# Patient Record
Sex: Female | Born: 1945 | Race: Black or African American | Hispanic: No | Smoking: Former smoker
Health system: Southern US, Community
[De-identification: ages and names within clinical notes are randomized; demographics above are authoritative.]

## PROBLEM LIST (undated history)

## (undated) DIAGNOSIS — H269 Unspecified cataract: Secondary | ICD-10-CM

## (undated) DIAGNOSIS — E785 Hyperlipidemia, unspecified: Secondary | ICD-10-CM

## (undated) DIAGNOSIS — K219 Gastro-esophageal reflux disease without esophagitis: Secondary | ICD-10-CM

## (undated) DIAGNOSIS — I2510 Atherosclerotic heart disease of native coronary artery without angina pectoris: Secondary | ICD-10-CM

## (undated) DIAGNOSIS — F32A Depression, unspecified: Secondary | ICD-10-CM

## (undated) DIAGNOSIS — M1990 Unspecified osteoarthritis, unspecified site: Secondary | ICD-10-CM

## (undated) DIAGNOSIS — E039 Hypothyroidism, unspecified: Secondary | ICD-10-CM

## (undated) DIAGNOSIS — I10 Essential (primary) hypertension: Secondary | ICD-10-CM

## (undated) DIAGNOSIS — T7840XA Allergy, unspecified, initial encounter: Secondary | ICD-10-CM

## (undated) DIAGNOSIS — I639 Cerebral infarction, unspecified: Secondary | ICD-10-CM

## (undated) HISTORY — DX: Gastro-esophageal reflux disease without esophagitis: K21.9

## (undated) HISTORY — DX: Unspecified osteoarthritis, unspecified site: M19.90

## (undated) HISTORY — DX: Unspecified cataract: H26.9

## (undated) HISTORY — DX: Essential (primary) hypertension: I10

## (undated) HISTORY — DX: Allergy, unspecified, initial encounter: T78.40XA

## (undated) HISTORY — PX: CHOLECYSTECTOMY: SHX55

## (undated) HISTORY — DX: Hyperlipidemia, unspecified: E78.5

## (undated) HISTORY — PX: CATARACT EXTRACTION: SUR2

## (undated) HISTORY — DX: Atherosclerotic heart disease of native coronary artery without angina pectoris: I25.10

## (undated) HISTORY — PX: ABDOMINAL HYSTERECTOMY: SHX81

## (undated) HISTORY — DX: Cerebral infarction, unspecified: I63.9

## (undated) HISTORY — PX: THYROIDECTOMY: SHX17

## (undated) HISTORY — PX: EYE SURGERY: SHX253

## (undated) HISTORY — DX: Depression, unspecified: F32.A

## (undated) HISTORY — DX: Hypothyroidism, unspecified: E03.9

---

## 1997-07-05 ENCOUNTER — Emergency Department (HOSPITAL_COMMUNITY): Admission: EM | Admit: 1997-07-05 | Discharge: 1997-07-05 | Payer: Self-pay | Admitting: Emergency Medicine

## 1999-10-10 ENCOUNTER — Encounter: Payer: Self-pay | Admitting: Emergency Medicine

## 1999-10-10 ENCOUNTER — Encounter: Admission: RE | Admit: 1999-10-10 | Discharge: 1999-10-10 | Payer: Self-pay | Admitting: Emergency Medicine

## 1999-10-10 ENCOUNTER — Encounter (INDEPENDENT_AMBULATORY_CARE_PROVIDER_SITE_OTHER): Payer: Self-pay | Admitting: *Deleted

## 1999-10-23 ENCOUNTER — Encounter (HOSPITAL_BASED_OUTPATIENT_CLINIC_OR_DEPARTMENT_OTHER): Payer: Self-pay | Admitting: General Surgery

## 1999-10-24 ENCOUNTER — Encounter (INDEPENDENT_AMBULATORY_CARE_PROVIDER_SITE_OTHER): Payer: Self-pay | Admitting: Specialist

## 1999-10-24 ENCOUNTER — Ambulatory Visit (HOSPITAL_COMMUNITY): Admission: RE | Admit: 1999-10-24 | Discharge: 1999-10-25 | Payer: Self-pay | Admitting: General Surgery

## 1999-10-24 ENCOUNTER — Encounter (INDEPENDENT_AMBULATORY_CARE_PROVIDER_SITE_OTHER): Payer: Self-pay | Admitting: *Deleted

## 2000-08-12 ENCOUNTER — Encounter: Admission: RE | Admit: 2000-08-12 | Discharge: 2000-08-12 | Payer: Self-pay | Admitting: Gynecology

## 2000-08-12 ENCOUNTER — Encounter: Payer: Self-pay | Admitting: Gynecology

## 2000-09-22 ENCOUNTER — Other Ambulatory Visit: Admission: RE | Admit: 2000-09-22 | Discharge: 2000-09-22 | Payer: Self-pay | Admitting: Gynecology

## 2001-02-08 ENCOUNTER — Emergency Department (HOSPITAL_COMMUNITY): Admission: EM | Admit: 2001-02-08 | Discharge: 2001-02-08 | Payer: Self-pay | Admitting: Emergency Medicine

## 2001-02-09 ENCOUNTER — Encounter: Payer: Self-pay | Admitting: Emergency Medicine

## 2001-08-22 ENCOUNTER — Encounter: Admission: RE | Admit: 2001-08-22 | Discharge: 2001-08-22 | Payer: Self-pay | Admitting: Gynecology

## 2001-08-22 ENCOUNTER — Encounter: Payer: Self-pay | Admitting: Gynecology

## 2001-10-04 ENCOUNTER — Other Ambulatory Visit: Admission: RE | Admit: 2001-10-04 | Discharge: 2001-10-04 | Payer: Self-pay | Admitting: Gynecology

## 2002-08-24 ENCOUNTER — Encounter: Payer: Self-pay | Admitting: Gynecology

## 2002-08-24 ENCOUNTER — Encounter: Admission: RE | Admit: 2002-08-24 | Discharge: 2002-08-24 | Payer: Self-pay | Admitting: Gynecology

## 2002-10-16 ENCOUNTER — Other Ambulatory Visit: Admission: RE | Admit: 2002-10-16 | Discharge: 2002-10-16 | Payer: Self-pay

## 2003-08-28 ENCOUNTER — Encounter: Admission: RE | Admit: 2003-08-28 | Discharge: 2003-08-28 | Payer: Self-pay | Admitting: Gynecology

## 2003-09-05 ENCOUNTER — Other Ambulatory Visit: Admission: RE | Admit: 2003-09-05 | Discharge: 2003-09-05 | Payer: Self-pay | Admitting: Gynecology

## 2003-12-09 ENCOUNTER — Emergency Department (HOSPITAL_COMMUNITY): Admission: EM | Admit: 2003-12-09 | Discharge: 2003-12-09 | Payer: Self-pay | Admitting: Emergency Medicine

## 2004-09-09 ENCOUNTER — Other Ambulatory Visit: Admission: RE | Admit: 2004-09-09 | Discharge: 2004-09-09 | Payer: Self-pay | Admitting: Gynecology

## 2004-09-16 ENCOUNTER — Encounter: Admission: RE | Admit: 2004-09-16 | Discharge: 2004-09-16 | Payer: Self-pay | Admitting: Gynecology

## 2005-09-17 ENCOUNTER — Encounter: Admission: RE | Admit: 2005-09-17 | Discharge: 2005-09-17 | Payer: Self-pay | Admitting: Gynecology

## 2005-09-25 ENCOUNTER — Other Ambulatory Visit: Admission: RE | Admit: 2005-09-25 | Discharge: 2005-09-25 | Payer: Self-pay | Admitting: Gynecology

## 2006-03-30 HISTORY — PX: TRANSTHORACIC ECHOCARDIOGRAM: SHX275

## 2006-06-17 ENCOUNTER — Emergency Department (HOSPITAL_COMMUNITY): Admission: EM | Admit: 2006-06-17 | Discharge: 2006-06-17 | Payer: Self-pay | Admitting: Emergency Medicine

## 2006-10-18 ENCOUNTER — Other Ambulatory Visit: Admission: RE | Admit: 2006-10-18 | Discharge: 2006-10-18 | Payer: Self-pay | Admitting: Gynecology

## 2006-10-20 ENCOUNTER — Encounter: Admission: RE | Admit: 2006-10-20 | Discharge: 2006-10-20 | Payer: Self-pay | Admitting: Gynecology

## 2007-02-17 HISTORY — PX: CARDIAC CATHETERIZATION: SHX172

## 2007-03-31 HISTORY — PX: NM MYOCAR PERF WALL MOTION: HXRAD629

## 2007-10-21 ENCOUNTER — Encounter: Admission: RE | Admit: 2007-10-21 | Discharge: 2007-10-21 | Payer: Self-pay | Admitting: Gynecology

## 2008-10-23 ENCOUNTER — Encounter: Admission: RE | Admit: 2008-10-23 | Discharge: 2008-10-23 | Payer: Self-pay | Admitting: Gynecology

## 2008-10-29 ENCOUNTER — Ambulatory Visit: Payer: Self-pay | Admitting: Internal Medicine

## 2008-10-29 DIAGNOSIS — K5900 Constipation, unspecified: Secondary | ICD-10-CM | POA: Insufficient documentation

## 2008-10-29 DIAGNOSIS — E119 Type 2 diabetes mellitus without complications: Secondary | ICD-10-CM

## 2008-10-29 DIAGNOSIS — IMO0001 Reserved for inherently not codable concepts without codable children: Secondary | ICD-10-CM | POA: Insufficient documentation

## 2008-10-29 DIAGNOSIS — Z794 Long term (current) use of insulin: Secondary | ICD-10-CM

## 2008-10-29 DIAGNOSIS — K219 Gastro-esophageal reflux disease without esophagitis: Secondary | ICD-10-CM

## 2008-12-04 ENCOUNTER — Ambulatory Visit: Payer: Self-pay | Admitting: Internal Medicine

## 2009-10-25 ENCOUNTER — Encounter: Admission: RE | Admit: 2009-10-25 | Discharge: 2009-10-25 | Payer: Self-pay | Admitting: Gynecology

## 2010-08-15 NOTE — Op Note (Signed)
[place]. [facility]  Patient:    Shelly Blevins, Shelly Blevins                        MRN: [id number] Proc. Date: 10/24/99 Adm. Date:  [id number] Disc. Date: [id number] Attending:  Sonda Primes CC:         Mardene Celeste. Lurene Shadow, M.D. (2 copies)                           Operative Report  PREOPERATIVE DIAGNOSIS:  Chronic calculus cholecystitis.  POSTOPERATIVE DIAGNOSIS:  Acute and subacute cholecystitis.  OPERATION:  Laparoscopic cholecystectomy.  SURGEON:  Mardene Celeste. Lurene Shadow, M.D.  ASSISTANT:  Marnee Spring. Wiliam Ke, M.D.  ANESTHESIA:  General.  INDICATIONS:  The patient is a 65 year old woman, presenting primary through the [facility] complaining of severe upper abdominal pain, nausea and vomiting.  She was thoroughly worked up with gallbladder ultrasound which showed multiple gallstones and thick-walled gallbladder.  Liver function studies have been normal.  Amylase and lipase levels have been within normal limits.  She was prepared and brought to the operating room for laparoscopic cholecystectomy.  DESCRIPTION OF PROCEDURE:  Following the induction of satisfactory anesthesia, the patient was positioned supinely, and the abdomen is prepped and draped to be included in a sterile operative field.  Open laparoscopy is created at the umbilicus with introduction of a Hasson cannula and insufflation of the peritoneal cavity to 14 mmHg pressure using carbon dioxide.  Following insufflation the camera is inserted, and visual exploration of the abdomen ensued.  The gallbladder was noted to be thick-walled and acutely inflamed, inflamed.  The liver edges were sharp, the liver surface is smooth.  The duodenum is closely adhered onto the gallbladder wall.  There were multiple adhesions, filmy adhesions of the pelvis.  The pelvic organs were not well-visualized.  The anterior gastric wall appeared to be normal.   Under direct vision, epigastric and lateral ports  were placed.  The gallbladder was grasped and retracted cephalad, and dissection of an inflammatory peel of omentum off the gallbladder wall was carried out carrying dissection down towards the ampulla of the gallbladder.  The cystic artery and the cystic duct were then carefully dissected free, the cystic artery being traced up to the gallbladder wall and the cystic duct traced to the gallbladder cystic duct junction and down to the gallbladder common duct junction.  The cystic artery was doubly clipped and transected.  The cystic duct was clipped proximally.  I opened the cystic duct and milked out multiple pieces of stone and sludge with the cystic duct.  The cystic duct, however, was quite small and it was not possible for me to get a catheter into the cystic duct to carry out a cholangiogram.  It was to be noted that the patients total bilirubin was normal as well as her alkaline phosphatase, SGOT and SGPT.  I decided then to abandon attempt at the cholangiogram, and the cystic duct was doubly clipped and transected.  The gallbladder was dissected free from the liver bed maintaining hemostasis along the entire course of the dissection using electrocautery.  At the end of dissection, the liver bed was again inspected. Additional areas of bleeding were treated with electrocautery.  The right upper quadrant was then thoroughly irrigated with normal saline.  The camera was then moved to the epigastric port, and the gallbladder retrieved with an EndoCatch pouch and removed from  the abdominal cavity through the umbilical port.  The right upper quadrant was then irrigated thoroughly.  Sponge, instrument and sharp counts were then verified and the wounds closed in layers as follows after the pneumoperitoneum was deflated:  The umbilical wound closed in two layers with 0 Dexon and 4-0 Dexon, epigastric and lateral ports closed with 4-0 Dexon.  All wounds were reinforced with Steri-Strips  and sterile dressings applied.  Anesthetic was reversed and the patient was removed from the operating room to the recovery room in stable condition.  She tolerated the procedure well. DD:  10/24/99 TD:  10/26/99 Job: [id number]

## 2010-10-09 ENCOUNTER — Other Ambulatory Visit: Payer: Self-pay | Admitting: Gynecology

## 2010-10-09 DIAGNOSIS — Z1231 Encounter for screening mammogram for malignant neoplasm of breast: Secondary | ICD-10-CM

## 2010-10-31 ENCOUNTER — Ambulatory Visit
Admission: RE | Admit: 2010-10-31 | Discharge: 2010-10-31 | Disposition: A | Discharge status: Home / Self Care | Payer: PRIVATE HEALTH INSURANCE | Source: Ambulatory Visit | Attending: Gynecology | Admitting: Gynecology

## 2010-10-31 DIAGNOSIS — Z1231 Encounter for screening mammogram for malignant neoplasm of breast: Secondary | ICD-10-CM

## 2010-12-15 ENCOUNTER — Other Ambulatory Visit: Payer: Self-pay | Admitting: Gynecology

## 2011-04-03 ENCOUNTER — Encounter: Payer: Self-pay | Admitting: *Deleted

## 2011-04-03 ENCOUNTER — Encounter: Payer: PRIVATE HEALTH INSURANCE | Attending: Endocrinology | Admitting: *Deleted

## 2011-04-03 DIAGNOSIS — E119 Type 2 diabetes mellitus without complications: Secondary | ICD-10-CM

## 2011-04-03 DIAGNOSIS — Z713 Dietary counseling and surveillance: Secondary | ICD-10-CM | POA: Insufficient documentation

## 2011-04-03 NOTE — Progress Notes (Signed)
  Medical Nutrition Therapy:  Appt start time: 0800 end time:  0900.   Assessment:  Primary concerns today: Patient here for update on diabetes education and carb counting . She has history of Type 2 Diabetes for about 10 years. 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 She also walks as able to increase her activity outside of work. She test her BG daily in the AM and is aware that her A1c is 8%.   MEDICATIONS: see list. Diabetes meds are Metformin and Novolog 70/30 @ 30 units before breakfast and supper   DIETARY INTAKE:  Usual eating pattern includes 3 meals and 1-3 snacks per day.  Everyday foods include good variety of food groups.  Avoided foods include rice, potatoes, pasta.    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   Nutritional Diagnosis:  NI-1.5 Excessive energy intake As related to obesity.  As evidenced by BMI of 32.8%.    Intervention:  Nutrition counseling and diabetes education provided. Reviewed basic physiology of diabetes, symptoms of high and low BG & treatment options, carb counting and value of a consistent carb intake for BG management. 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 Plan to review carb counting abilities, BG Log Book and insulin action.

## 2011-04-07 NOTE — Patient Instructions (Signed)
Goals:  Follow Diabetes Meal Plan as instructed  Eat 3 meals and snacks if hungry  Limit carbohydrate intake to 45 grams carbohydrate/meal  Limit carbohydrate intake to 15 grams carbohydrate/snack  Add lean protein foods to meals/snacks  Monitor glucose levels as instructed by your doctor  Aim for 15-30 mins of physical activity daily  Bring food record and glucose log to your next nutrition visit

## 2011-05-01 ENCOUNTER — Ambulatory Visit: Payer: PRIVATE HEALTH INSURANCE | Admitting: *Deleted

## 2011-05-13 ENCOUNTER — Ambulatory Visit: Payer: PRIVATE HEALTH INSURANCE | Admitting: *Deleted

## 2011-06-17 ENCOUNTER — Encounter: Payer: Self-pay | Admitting: *Deleted

## 2011-06-17 ENCOUNTER — Encounter: Payer: PRIVATE HEALTH INSURANCE | Attending: Endocrinology | Admitting: *Deleted

## 2011-06-17 VITALS — Ht 63.0 in | Wt 187.0 lb

## 2011-06-17 DIAGNOSIS — E119 Type 2 diabetes mellitus without complications: Secondary | ICD-10-CM

## 2011-06-17 DIAGNOSIS — Z713 Dietary counseling and surveillance: Secondary | ICD-10-CM | POA: Insufficient documentation

## 2011-06-17 NOTE — Progress Notes (Signed)
  Medical Nutrition Therapy:  Appt start time: 1730 end time:  1800.  Assessment:  Primary concerns today: patient here for follow up visit for diabetes education. She states her BGs have dropped from 170-200 range to 100-130 range both before breakfast and supper meals. She continues to take 70/30 @ 40 units before breakfast and 45 units before supper. She has started walking with her grand daughter every day for about 3 minutes which she enjoys and helps her sleep better at night.  MEDICATIONS: see list. Diabetes meds are Metformin and Novolog 70/30 before breakfast and supper   DIETARY INTAKE:  Usual eating pattern includes 3 meals and 1-3 snacks per day.  Everyday foods include good variety of food groups.  Avoided foods include rice, potatoes, pasta.    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   Nutritional Diagnosis:  NI-5.8.4 Inconsistent carbohydrate intake As related to diabetes.  As evidenced by A1c of 10% before education..    Intervention:   Commended patient on her successful changes in eating habits and her implementation of walking on a daily basis.  Goals: Follow Diabetes Meal Plan as instructed Eat 3 meals and snacks if hungry Limit carbohydrate intake to 30-45 grams carbohydrate/meal Limit carbohydrate intake to 15 grams  carbohydrate/snack Consider checking BG at alternate times during the day, including some post meal times Monitor glucose levels as instructed by your doctor Continue with 30 mins of walking every day   Handouts given during visit include:  Living Well with Diabetes  Carb Counting and Food Label   Monitoring/Evaluation:  Dietary intake, exercise, reading food labels, and body weight in 4 weeks. Plan to review carb counting abilities, BG Log Book and insulin action.

## 2011-11-10 ENCOUNTER — Other Ambulatory Visit: Payer: Self-pay | Admitting: Gynecology

## 2011-11-10 DIAGNOSIS — Z1231 Encounter for screening mammogram for malignant neoplasm of breast: Secondary | ICD-10-CM

## 2011-11-26 ENCOUNTER — Ambulatory Visit
Admission: RE | Admit: 2011-11-26 | Discharge: 2011-11-26 | Disposition: A | Discharge status: Home / Self Care | Payer: PRIVATE HEALTH INSURANCE | Source: Ambulatory Visit | Attending: Gynecology | Admitting: Gynecology

## 2011-11-26 DIAGNOSIS — Z1231 Encounter for screening mammogram for malignant neoplasm of breast: Secondary | ICD-10-CM

## 2011-12-28 ENCOUNTER — Other Ambulatory Visit: Payer: Self-pay | Admitting: Gynecology

## 2012-10-17 ENCOUNTER — Other Ambulatory Visit: Payer: Self-pay

## 2012-10-17 DIAGNOSIS — Z1231 Encounter for screening mammogram for malignant neoplasm of breast: Secondary | ICD-10-CM

## 2012-11-29 ENCOUNTER — Ambulatory Visit
Admission: RE | Admit: 2012-11-29 | Discharge: 2012-11-29 | Disposition: A | Discharge status: Home / Self Care | Payer: PRIVATE HEALTH INSURANCE | Source: Ambulatory Visit

## 2012-11-29 DIAGNOSIS — Z1231 Encounter for screening mammogram for malignant neoplasm of breast: Secondary | ICD-10-CM

## 2013-06-02 ENCOUNTER — Encounter: Payer: Self-pay | Admitting: *Deleted

## 2013-06-06 ENCOUNTER — Ambulatory Visit (INDEPENDENT_AMBULATORY_CARE_PROVIDER_SITE_OTHER): Payer: PRIVATE HEALTH INSURANCE | Admitting: Internal Medicine

## 2013-06-06 ENCOUNTER — Encounter: Payer: Self-pay | Admitting: Internal Medicine

## 2013-06-06 VITALS — BP 144/68 | HR 64 | Ht 64.5 in | Wt 182.0 lb

## 2013-06-06 DIAGNOSIS — I10 Essential (primary) hypertension: Secondary | ICD-10-CM | POA: Insufficient documentation

## 2013-06-06 DIAGNOSIS — R0602 Shortness of breath: Secondary | ICD-10-CM

## 2013-06-06 DIAGNOSIS — I2583 Coronary atherosclerosis due to lipid rich plaque: Secondary | ICD-10-CM

## 2013-06-06 DIAGNOSIS — E785 Hyperlipidemia, unspecified: Secondary | ICD-10-CM | POA: Insufficient documentation

## 2013-06-06 DIAGNOSIS — E119 Type 2 diabetes mellitus without complications: Secondary | ICD-10-CM

## 2013-06-06 DIAGNOSIS — I2510 Atherosclerotic heart disease of native coronary artery without angina pectoris: Secondary | ICD-10-CM

## 2013-06-06 DIAGNOSIS — R079 Chest pain, unspecified: Secondary | ICD-10-CM

## 2013-06-06 NOTE — Patient Instructions (Signed)
Your physician has requested that you have a lexiscan myoview. For further information please visit [email or web address]. Please follow instruction sheet, as given.  Your physician wants you to follow-up in: 1 year. You will receive a reminder letter in the mail two months in advance. If you don't receive a letter, please call our office to schedule the follow-up appointment.

## 2013-06-06 NOTE — Progress Notes (Signed)
OFFICE NOTE  Chief Complaint:  Short of breath  Primary Care Physician: Jacelyn Pi, MD  HPI:  Shelly Blevins is a 68 year old female with a history of coronary artery disease with mild nonobstructive coronary disease and 50% (not 30% as previously reported) proximal circumflex lesion. She also has diabetes, insulin dependent, hypertension, and dyslipidemia. There is an unfortunate sudden-death history in her family with her sister dying at age 47 and a brother who died of heart failure at 57. Unfortunately, her husband recently died and he had a lot of medical problems in November of 2013. She has had problems with hypothyroidism and was off her thyroid medication for some time but that has been reestablished. Telemetry she is noted some increase in shortness of breath with activities. 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 She also is describing some discomfort in the left upper chest area into the left shoulder. This is actually short-lived but sometimes is worse with exertion and relieved by rest. It is concerning to me for possible angina.  PMHx:  Past Medical History  Diagnosis Date  . Diabetes mellitus     insulin-dependent  . Hypertension   . CAD (coronary artery disease)     mild nonobstructive disease, 30% Cfx lesion  . Dyslipidemia   . Hypothyroidism     Past Surgical History  Procedure Laterality Date  . Transthoracic echocardiogram  2008    normal study   . Nm myocar perf wall motion  2009    lexiscan myoview - perfusion defect in anterior myociaral region (breast attenuation) - low risk scan - EF 69%  . Cardiac catheterization  02/17/2007    mild CAD with 50% prox L Cfx stenosis, normal LV function (Dr. Norlene Duel)    FAMHx:  Family History  Problem Relation Age of Onset  . Sudden death Sister     died at 47  . Heart failure Brother     died at 36  . Heart failure Mother   . Diabetes Mother     SOCHx:   reports that she quit smoking about 27 years ago. She has never used smokeless tobacco. She reports that she does not drink alcohol or use illicit drugs.  ALLERGIES:  No Known Allergies  ROS: A comprehensive review of systems was negative except for: Respiratory: positive for dyspnea on exertion Cardiovascular: positive for chest pain  HOME MEDS: Current Outpatient Prescriptions  Medication Sig Dispense Refill  . amLODipine (NORVASC) 5 MG tablet Take 5 mg by mouth daily.      Marland Kitchen aspirin 81 MG tablet Take 81 mg by mouth daily.       Marland Kitchen atorvastatin (LIPITOR) 80 MG tablet Take 80 mg by mouth daily.        . Coenzyme Q10 (CO Q-10 PO) Take 1 capsule by mouth daily.      . cyanocobalamin 500 MCG tablet Take 500 mcg by mouth daily.      . hydrochlorothiazide (HYDRODIURIL) 25 MG tablet Take 25 mg by mouth daily.      . insulin aspart protamine-insulin aspart (NOVOLOG 70/30) (70-30) 100 UNIT/ML injection Inject 60-70 Units into the skin 2 (two) times daily with a meal.       . latanoprost (XALATAN) 0.005 % ophthalmic solution Place 1 drop into both eyes at bedtime.        Marland Kitchen levothyroxine (SYNTHROID, LEVOTHROID) 75 MCG tablet Take 75 mcg by mouth daily.        Marland Kitchen  loratadine (CLARITIN) 10 MG tablet Take 10 mg by mouth daily as needed for allergies.      . metFORMIN (GLUCOPHAGE) 1000 MG tablet Take 1,000 mg by mouth 2 (two) times daily with a meal.        . ramipril (ALTACE) 10 MG capsule Take 10 mg by mouth daily.         No current facility-administered medications for this visit.    LABS/IMAGING: No results found for this or any previous visit (from the past 48 hour(s)). No results found.  VITALS: BP 144/68  Pulse 64  Ht 5' 4.5" (1.638 m)  Wt 182 lb (82.555 kg)  BMI 30.77 kg/m2  EXAM: General appearance: alert and no distress Neck: no carotid bruit and no JVD Lungs: clear to auscultation bilaterally Heart: regular rate and rhythm, S1, S2 normal, no murmur, click, rub or  gallop Abdomen: soft, non-tender; bowel sounds normal; no masses,  no organomegaly Extremities: extremities normal, atraumatic, no cyanosis or edema Pulses: 2+ and symmetric Skin: Skin color, texture, turgor normal. No rashes or lesions Neurologic: Grossly normal  EKG: Normal sinus rhythm, nonspecific T wave changes at 64  ASSESSMENT: 1. Moderate single vessel coronary disease with a 50% circumflex lesion and 2009 2. Insulin-dependent diabetes 3. Hypertension 4. Dyslipidemia 5. Family history of premature sudden cardiac death 07/29/2022. Dyspnea/chest pain  PLAN: 1.   Shelly Blevins is describing some shortness of breath which is new and associated with left upper chest pain. This concerning for possible ischemia. She does have known 50% stenosis in the proximal circumflex in 2009. Her diabetes has been fairly well controlled however A1c ranges between 7 and 8. I would recommend a LexiScan nuclear stress test as her last stress test was in 2008. Plan to contact her with the results of that stress test and if abnormal she may need cardiac catheterization.  Pixie Casino, MD, [facility] Attending Cardiologist [facility]  Shelly Blevins 06/06/2013, 4:47 PM

## 2013-07-05 ENCOUNTER — Telehealth (HOSPITAL_COMMUNITY): Payer: Self-pay

## 2013-07-07 ENCOUNTER — Ambulatory Visit (HOSPITAL_COMMUNITY)
Admission: RE | Admit: 2013-07-07 | Discharge: 2013-07-07 | Disposition: A | Discharge status: Home / Self Care | Payer: PRIVATE HEALTH INSURANCE | Source: Ambulatory Visit | Attending: Internal Medicine | Admitting: Internal Medicine

## 2013-07-07 DIAGNOSIS — Z8249 Family history of ischemic heart disease and other diseases of the circulatory system: Secondary | ICD-10-CM | POA: Insufficient documentation

## 2013-07-07 DIAGNOSIS — R079 Chest pain, unspecified: Secondary | ICD-10-CM

## 2013-07-07 DIAGNOSIS — R0989 Other specified symptoms and signs involving the circulatory and respiratory systems: Secondary | ICD-10-CM | POA: Insufficient documentation

## 2013-07-07 DIAGNOSIS — R0609 Other forms of dyspnea: Secondary | ICD-10-CM | POA: Insufficient documentation

## 2013-07-07 DIAGNOSIS — I2510 Atherosclerotic heart disease of native coronary artery without angina pectoris: Secondary | ICD-10-CM

## 2013-07-07 DIAGNOSIS — R0602 Shortness of breath: Secondary | ICD-10-CM

## 2013-07-07 MED ORDER — REGADENOSON 0.4 MG/5ML IV SOLN
0.4000 mg | Freq: Once | INTRAVENOUS | Status: AC
  Administered 2013-07-07: 0.4 mg via INTRAVENOUS

## 2013-07-07 MED ORDER — TECHNETIUM TC 99M SESTAMIBI GENERIC - CARDIOLITE
30.1000 | Freq: Once | INTRAVENOUS | Status: AC | PRN
Start: 2013-07-07 — End: 2013-07-07
  Administered 2013-07-07: 30.1 via INTRAVENOUS

## 2013-07-07 MED ORDER — TECHNETIUM TC 99M SESTAMIBI GENERIC - CARDIOLITE
10.6000 | Freq: Once | INTRAVENOUS | Status: AC | PRN
  Administered 2013-07-07: 11 via INTRAVENOUS

## 2013-07-07 NOTE — Procedures (Addendum)
[place] [address] [place] [address] [phone]  Cardiology Nuclear Med Study  Shelly Blevins is a 68 y.o. female     MRN : [id number]     DOB: 08-Mar-1946  Procedure Date: 07/07/2013  Nuclear Med Background Indication for Stress Test:  Evaluation for Ischemia History:  CAD;Last Lexi MPI on 10/03/2007-nonischemic;EF=69% Cardiac Risk Factors: Family History - CAD, History of Smoking, Hypertension, IDDM Type 2, Lipids, Obesity and TIA  Symptoms:  Chest Pain, Dizziness, DOE, Light-Headedness and SOB   Nuclear Pre-Procedure Caffeine/Decaff Intake:  9:00pm NPO After: 7:00am   IV Site: R Forearm  IV 0.9% NS with Angio Cath:  22g  Chest Size (in):  n/a IV Started by: Rolene Course, RN  Height: 5\' 5"  (1.651 m)  Cup Size: D  BMI:  Body mass index is 30.29 kg/(m^2). Weight:  182 lb (82.555 kg)   Tech Comments:  n/a    Nuclear Med Study 1 or 2 day study: 1 day  Stress Test Type:  [place] Provider:  Lyman Bishop, MD   Resting Radionuclide: Technetium 39m Sestamibi  Resting Radionuclide Dose: 10.6 mCi   Stress Radionuclide:  Technetium 29m Sestamibi  Stress Radionuclide Dose: 30.1 mCi           Stress Protocol Rest HR: 54 Stress HR:75  Rest BP: 143/66 Stress BP: 143/66  Exercise Time (min): n/a METS: n/a          Dose of Adenosine (mg):  n/a Dose of Lexiscan: 0.4 mg  Dose of Atropine (mg): n/a Dose of Dobutamine: n/a mcg/kg/min (at max HR)  Stress Test Technologist: Mellody Memos, CCT Nuclear Technologist: Imagene Riches, CNMT   Rest Procedure:  Myocardial perfusion imaging was performed at rest 45 minutes following the intravenous administration of Technetium 58m Sestamibi. Stress Procedure:  The patient received IV Lexiscan 0.4 mg over 15-seconds.  Technetium 37m Sestamibi IV injected at 30-seconds.  There were no significant changes with Lexiscan.  Quantitative spect images were obtained  after a 45 minute delay.  Transient Ischemic Dilatation (Normal <1.22):  1.01 Lung/Heart Ratio (Normal <0.45):  0.26 QGS EDV:  77 ml QGS ESV:  24 ml LV Ejection Fraction: 69%  Nuclear Technologist: Imagene Riches, CNMT  PHYSICIAN INTERPRETATION  Rest ECG: NSR with non-specific ST-T wave changes and occasional PACs  Stress ECG: No significant change from baseline ECG and subtle inferior T wave inversion.  QPS Raw Data Images:  There is a breast shadow that accounts for the anterior attenuation.  There is significant tracer uptake in the underlying splanchnic viscerar / liver partially obscuring the inferior heart border. Stress Images:  There is decreased uptake in the mid infero-lateral wall. Rest Images:  There is decreased uptake in the mid infero-lateral wall. Subtraction (SDS):  There is a fixed inferior defect that is most consistent with bowel artifact / gut attenuation.  Impression Exercise Capacity:  Lexiscan with no exercise. BP Response:  Normal blood pressure response. Clinical Symptoms:  No symptoms. ECG Impression:  No significant ECG changes with Lexiscan. LV Wall Motion:  NL LV Function; NL Wall Motion Comparison with Prior Nuclear Study: No significant change from previous study  Overall Impression:  Normal stress nuclear study.     Leonie Man, MD  07/07/2013 12:51 PM

## 2013-09-27 NOTE — Telephone Encounter (Signed)
Encounter complete. 

## 2013-11-01 ENCOUNTER — Other Ambulatory Visit: Payer: Self-pay

## 2013-11-01 DIAGNOSIS — Z1231 Encounter for screening mammogram for malignant neoplasm of breast: Secondary | ICD-10-CM

## 2013-12-01 ENCOUNTER — Ambulatory Visit
Admission: RE | Admit: 2013-12-01 | Discharge: 2013-12-01 | Disposition: A | Discharge status: Home / Self Care | Payer: PRIVATE HEALTH INSURANCE | Source: Ambulatory Visit

## 2013-12-01 ENCOUNTER — Encounter (INDEPENDENT_AMBULATORY_CARE_PROVIDER_SITE_OTHER): Payer: Self-pay

## 2013-12-01 DIAGNOSIS — Z1231 Encounter for screening mammogram for malignant neoplasm of breast: Secondary | ICD-10-CM

## 2014-01-10 ENCOUNTER — Encounter: Payer: Self-pay | Admitting: Internal Medicine

## 2014-03-06 ENCOUNTER — Ambulatory Visit (INDEPENDENT_AMBULATORY_CARE_PROVIDER_SITE_OTHER): Payer: PRIVATE HEALTH INSURANCE | Admitting: Family Medicine

## 2014-03-06 VITALS — BP 158/64 | HR 55 | Temp 98.0°F | Resp 20 | Ht 63.0 in | Wt 183.0 lb

## 2014-03-06 DIAGNOSIS — R059 Cough, unspecified: Secondary | ICD-10-CM

## 2014-03-06 DIAGNOSIS — J0120 Acute ethmoidal sinusitis, unspecified: Secondary | ICD-10-CM

## 2014-03-06 DIAGNOSIS — R05 Cough: Secondary | ICD-10-CM

## 2014-03-06 DIAGNOSIS — H9201 Otalgia, right ear: Secondary | ICD-10-CM

## 2014-03-06 DIAGNOSIS — Z2821 Immunization not carried out because of patient refusal: Secondary | ICD-10-CM

## 2014-03-06 MED ORDER — AMOXICILLIN 875 MG PO TABS: 875.0000 mg | ORAL_TABLET | Freq: Two times a day (BID) | ORAL | Status: DC

## 2014-03-06 NOTE — Progress Notes (Signed)
Chief Complaint:  Chief Complaint  Patient presents with  . Sinusitis  . Headache  . Ear Pain    RIght    HPI: Shelly Blevins is a 68 y.o. female who is here for 1 week history of sinus pressure and HA, wakes up in the morning with sputum in her throat and it is light yellow, she has had no fevers or chills. She has right sided ear pain. She has some ringing her ears. 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 She has not gotten the flu vaccine. She has no fevers or chills. She denies any CP or SOB or palpitations. HAs tried otc meds withotu releiuf.   BP Readings from Last 3 Encounters:  03/06/14 158/64  06/06/13 144/68  10/29/08 118/62     Past Medical History  Diagnosis Date  . Diabetes mellitus     insulin-dependent  . Hypertension   . CAD (coronary artery disease)     mild nonobstructive disease, 30% Cfx lesion  . Dyslipidemia   . Hypothyroidism   . Allergy   . Arthritis   . Cataract   . Stroke    Past Surgical History  Procedure Laterality Date  . Transthoracic echocardiogram  2008    normal study   . Nm myocar perf wall motion  2009    lexiscan myoview - perfusion defect in anterior myociaral region (breast attenuation) - low risk scan - EF 69%  . Cardiac catheterization  02/17/2007    mild CAD with 50% prox L Cfx stenosis, normal LV function (Dr. Norlene Duel)  . Cholecystectomy    . Abdominal hysterectomy    . Eye surgery     History   Social History  . Marital Status: Married    Spouse Name: N/A    Number of Children: 2  . Years of Education: 10   Occupational History  .     Social History Main Topics  . Smoking status: Former Smoker    Quit date: 04/02/1986  . Smokeless tobacco: Never Used  . Alcohol Use: No  . Drug Use: No  . Sexual Activity: None   Other Topics Concern  . None   Social History Narrative   Family History  Problem Relation Age of Onset  . Sudden  death Sister     died at 74  . Heart failure Brother     died at 61  . Heart failure Mother   . Diabetes Mother    No Known Allergies Prior to Admission medications   Medication Sig Start Date End Date Taking? Authorizing Provider  amLODipine (NORVASC) 5 MG tablet Take 5 mg by mouth daily.   Yes Historical Provider, MD  aspirin 81 MG tablet Take 81 mg by mouth daily.    Yes Historical Provider, MD  atorvastatin (LIPITOR) 80 MG tablet Take 80 mg by mouth daily.     Yes Historical Provider, MD  Coenzyme Q10 (CO Q-10 PO) Take 1 capsule by mouth daily.   Yes Historical Provider, MD  cyanocobalamin 500 MCG tablet Take 500 mcg by mouth daily.   Yes Historical Provider, MD  hydrochlorothiazide (HYDRODIURIL) 25 MG tablet Take 25 mg by mouth daily.   Yes Historical Provider, MD  insulin aspart protamine-insulin aspart (NOVOLOG 70/30) (70-30) 100 UNIT/ML injection Inject 60-70 Units into the skin 2 (two) times daily with a meal.    Yes Historical Provider, MD  latanoprost (XALATAN) 0.005 % ophthalmic solution Place 1 drop into both eyes at bedtime.     Yes Historical Provider, MD  levothyroxine (SYNTHROID, LEVOTHROID) 75 MCG tablet Take 75 mcg by mouth daily.     Yes Historical Provider, MD  loratadine (CLARITIN) 10 MG tablet Take 10 mg by mouth daily as needed for allergies.   Yes Historical Provider, MD  metFORMIN (GLUCOPHAGE) 1000 MG tablet Take 1,000 mg by mouth 2 (two) times daily with a meal.     Yes Historical Provider, MD  ramipril (ALTACE) 10 MG capsule Take 10 mg by mouth daily.     Yes Historical Provider, MD     ROS: The patient denies fevers, chills, night sweats, unintentional weight loss, chest pain, palpitations, wheezing, dyspnea on exertion, nausea, vomiting, abdominal pain, dysuria, hematuria, melena, numbness, weakness, or tingling.   All other systems have been reviewed and were otherwise negative with the exception of those mentioned in the HPI and as above.    PHYSICAL  EXAM: Filed Vitals:   03/06/14 1708  BP: 158/64  Pulse: 55  Temp: 98 F (36.7 C)  Resp: 20   Filed Vitals:   03/06/14 1708  Height: 5\' 3"  (1.6 m)  Weight: 183 lb (83.008 kg)   Body mass index is 32.43 kg/(m^2).  General: Alert, no acute distress HEENT:  Normocephalic, atraumatic, oropharynx patent. EOMI, PERRLA.  + dul Right TM , erythematous throat, No exudates. + boggy nares, + sinus tenderness. Cardiovascular:  Regular rate and rhythm, no rubs murmurs or gallops.  Radial pulse intact. No pedal edema.  Respiratory: Clear to auscultation bilaterally.  No wheezes, rales, or rhonchi.  No cyanosis, no use of accessory musculature GI: No organomegaly, abdomen is soft and non-tender, positive bowel sounds.  No masses. Skin: No rashes. Neurologic: Facial musculature symmetric. Psychiatric: Patient is appropriate throughout our interaction. Lymphatic: No cervical lymphadenopathy Musculoskeletal: Gait intact.   LABS: Results for orders placed or performed in visit on 12/04/08  Glucose, capillary  Result Value Ref Range   Glucose-Capillary 99 70 - 99 mg/dL  Glucose, capillary  Result Value Ref Range   Glucose-Capillary 82 70 - 99 mg/dL     EKG/XRAY:   Primary read interpreted by Dr. Marin Comment at [facility].   ASSESSMENT/PLAN: Encounter Diagnoses  Name Primary?  . Acute ethmoidal sinusitis, recurrence not specified Yes  . Otalgia of right ear   . Cough   . 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 Patient is aware that all medications have potential sideeffects and we are unable to predict every sideeffect or drug-drug interaction that may  occur.  LE, [place], DO 03/07/2014 11:08 AM

## 2014-07-09 ENCOUNTER — Ambulatory Visit (INDEPENDENT_AMBULATORY_CARE_PROVIDER_SITE_OTHER): Payer: PRIVATE HEALTH INSURANCE | Admitting: Internal Medicine

## 2014-07-09 ENCOUNTER — Encounter: Payer: Self-pay | Admitting: Internal Medicine

## 2014-07-09 VITALS — BP 130/62 | HR 54 | Ht 64.5 in | Wt 179.2 lb

## 2014-07-09 DIAGNOSIS — I10 Essential (primary) hypertension: Secondary | ICD-10-CM

## 2014-07-09 DIAGNOSIS — E785 Hyperlipidemia, unspecified: Secondary | ICD-10-CM

## 2014-07-09 DIAGNOSIS — E119 Type 2 diabetes mellitus without complications: Secondary | ICD-10-CM | POA: Diagnosis not present

## 2014-07-09 DIAGNOSIS — Z794 Long term (current) use of insulin: Secondary | ICD-10-CM

## 2014-07-09 DIAGNOSIS — I2510 Atherosclerotic heart disease of native coronary artery without angina pectoris: Secondary | ICD-10-CM

## 2014-07-09 DIAGNOSIS — IMO0001 Reserved for inherently not codable concepts without codable children: Secondary | ICD-10-CM

## 2014-07-09 DIAGNOSIS — I2583 Coronary atherosclerosis due to lipid rich plaque: Secondary | ICD-10-CM

## 2014-07-09 NOTE — Progress Notes (Signed)
OFFICE NOTE  Chief Complaint:  Occasional lightheadedness and dizziness  Primary Care Physician: Shelly Pi, MD  HPI:  Shelly Blevins is a 69 year old female with a history of coronary artery disease with mild nonobstructive coronary disease and 50% (not 30% as previously reported) proximal circumflex lesion. She also has diabetes, insulin dependent, hypertension, and dyslipidemia. There is an unfortunate sudden-death history in her family with her sister dying at age 24 and a brother who died of heart failure at 37. Unfortunately, her husband recently died and he had a lot of medical problems in November of 2013. She has had problems with hypothyroidism and was off her thyroid medication for some time but that has been reestablished. Telemetry she is noted some increase in shortness of breath with activities. 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 She also is describing some discomfort in the left upper chest area into the left shoulder. This is actually short-lived but sometimes is worse with exertion and relieved by rest. It is concerning to me for possible angina.  I saw Shelly Blevins back in the office today. Overall she is doing well. She denies any chest pain or worsening shortness of breath. At her last office visit she underwent a nuclear stress test which was negative for ischemia. Her diabetes is been fairly well controlled. She recently saw Shelly Blevins who is her endocrinologist. Blood pressure is at goal. Cholesterol is also followed by her endocrinologist.  PMHx:  Past Medical History  Diagnosis Date  . Diabetes mellitus     insulin-dependent  . Hypertension   . CAD (coronary artery disease)     mild nonobstructive disease, 30% Cfx lesion  . Dyslipidemia   . Hypothyroidism   . Allergy   . Arthritis   . Cataract   . Stroke     Past Surgical History  Procedure Laterality Date  . Transthoracic echocardiogram  2008    normal  study   . Nm myocar perf wall motion  2009    lexiscan myoview - perfusion defect in anterior myociaral region (breast attenuation) - low risk scan - EF 69%  . Cardiac catheterization  02/17/2007    mild CAD with 50% prox L Cfx stenosis, normal LV function (Dr. Norlene Blevins)  . Cholecystectomy    . Abdominal hysterectomy    . Eye surgery      FAMHx:  Family History  Problem Relation Age of Onset  . Sudden death Sister     died at 90  . Heart failure Brother     died at 51  . Heart failure Mother   . Diabetes Mother     SOCHx:   reports that she quit smoking about 28 years ago. She has never used smokeless tobacco. She reports that she does not drink alcohol or use illicit drugs.  ALLERGIES:  No Known Allergies  ROS: A comprehensive review of systems was negative.  HOME MEDS: Current Outpatient Prescriptions  Medication Sig Dispense Refill  . amLODipine (NORVASC) 5 MG tablet Take 5 mg by mouth daily.    Marland Kitchen aspirin 81 MG tablet Take 81 mg by mouth daily.     Marland Kitchen atorvastatin (LIPITOR) 80 MG tablet Take 80 mg by mouth daily.      . Coenzyme Q10 (CO Q-10 PO) Take 1 capsule by mouth daily.    . cyanocobalamin 500 MCG tablet Take 500 mcg by mouth daily.    . hydrochlorothiazide (HYDRODIURIL) 25  MG tablet Take 25 mg by mouth daily.    . insulin aspart protamine-insulin aspart (NOVOLOG 70/30) (70-30) 100 UNIT/ML injection Inject 60-70 Units into the skin 2 (two) times daily with a meal.     . latanoprost (XALATAN) 0.005 % ophthalmic solution Place 1 drop into both eyes at bedtime.      Marland Kitchen levothyroxine (SYNTHROID, LEVOTHROID) 75 MCG tablet Take 75 mcg by mouth daily.      Marland Kitchen loratadine (CLARITIN) 10 MG tablet Take 10 mg by mouth daily as needed for allergies.    . metFORMIN (GLUCOPHAGE) 1000 MG tablet Take 1,000 mg by mouth 2 (two) times daily with a meal.      . ramipril (ALTACE) 10 MG capsule Take 10 mg by mouth daily.       No current facility-administered medications for this  visit.    LABS/IMAGING: No results found for this or any previous visit (from the past 48 hour(s)). No results found.  VITALS: BP 130/62 mmHg  Pulse 54  Ht 5' 4.5" (1.638 m)  Wt 179 lb 3.2 oz (81.285 kg)  BMI 30.30 kg/m2  EXAM: General appearance: alert and no distress Neck: no carotid bruit and no JVD Lungs: clear to auscultation bilaterally Heart: regular rate and rhythm, S1, S2 normal, no murmur, click, rub or gallop Abdomen: soft, non-tender; bowel sounds normal; no masses,  no organomegaly Extremities: extremities normal, atraumatic, no cyanosis or edema Pulses: 2+ and symmetric Skin: Skin color, texture, turgor normal. No rashes or lesions Neurologic: Grossly normal  EKG: Sinus bradycardia 55  ASSESSMENT: 1. Moderate single vessel coronary disease with a 50% circumflex lesion and 2009 - negative nuclear stress test in 2015 2. Insulin-dependent diabetes 3. Hypertension 4. Dyslipidemia 5. Family history of premature sudden cardiac death 06/12/22. Dyspnea/chest pain  PLAN: 1.   Shelly Blevins is doing well without recurrent chest pain or shortness of breath. Cholesterol and diabetes are followed by her endocrinologist. These are reportedly well controlled. Blood pressure is at goal on her current medications. I recommend continuing her current medications and i will be happy to see her back in a year.  Pixie Casino, MD, [facility] Attending Cardiologist [facility]  Blevins,Shelly C 07/09/2014, 2:19 PM

## 2014-07-09 NOTE — Patient Instructions (Signed)
Dr Debara Pickett has made no changes in your current medications or treatment plan.  Dr Debara Pickett recommends that you schedule a follow-up appointment in 1 year. You will receive a reminder letter in the mail two months in advance. If you don't receive a letter, please call our office to schedule the follow-up appointment.

## 2014-11-02 ENCOUNTER — Other Ambulatory Visit: Payer: Self-pay

## 2014-11-02 DIAGNOSIS — Z1231 Encounter for screening mammogram for malignant neoplasm of breast: Secondary | ICD-10-CM

## 2014-12-04 ENCOUNTER — Ambulatory Visit
Admission: RE | Admit: 2014-12-04 | Discharge: 2014-12-04 | Disposition: A | Discharge status: Home / Self Care | Payer: PRIVATE HEALTH INSURANCE | Source: Ambulatory Visit

## 2014-12-04 DIAGNOSIS — Z1231 Encounter for screening mammogram for malignant neoplasm of breast: Secondary | ICD-10-CM

## 2015-04-17 ENCOUNTER — Encounter: Payer: Self-pay | Admitting: Medical

## 2015-04-17 ENCOUNTER — Ambulatory Visit (INDEPENDENT_AMBULATORY_CARE_PROVIDER_SITE_OTHER): Payer: PRIVATE HEALTH INSURANCE | Admitting: Medical

## 2015-04-17 VITALS — BP 150/70 | HR 83 | Wt 181.0 lb

## 2015-04-17 DIAGNOSIS — IMO0001 Reserved for inherently not codable concepts without codable children: Secondary | ICD-10-CM

## 2015-04-17 DIAGNOSIS — E119 Type 2 diabetes mellitus without complications: Secondary | ICD-10-CM | POA: Diagnosis not present

## 2015-04-17 DIAGNOSIS — I10 Essential (primary) hypertension: Secondary | ICD-10-CM

## 2015-04-17 DIAGNOSIS — Z794 Long term (current) use of insulin: Secondary | ICD-10-CM | POA: Diagnosis not present

## 2015-04-17 DIAGNOSIS — E785 Hyperlipidemia, unspecified: Secondary | ICD-10-CM | POA: Diagnosis not present

## 2015-04-17 DIAGNOSIS — I2510 Atherosclerotic heart disease of native coronary artery without angina pectoris: Secondary | ICD-10-CM | POA: Diagnosis not present

## 2015-04-17 DIAGNOSIS — E039 Hypothyroidism, unspecified: Secondary | ICD-10-CM | POA: Diagnosis not present

## 2015-04-17 DIAGNOSIS — I2583 Coronary atherosclerosis due to lipid rich plaque: Secondary | ICD-10-CM

## 2015-04-17 MED ORDER — RAMIPRIL 10 MG PO CAPS: 10.0000 mg | ORAL_CAPSULE | Freq: Every day | ORAL | Status: DC

## 2015-04-17 NOTE — Progress Notes (Signed)
Subjective: Chief Complaint  Patient presents with  . New Patient (Initial Visit)    get established. and she brought her medications, wants her ramipril refilled.    Here to establish care.  Was seeing PCP Dr. York Ram on [place], but hard to get appt with him, and wants to change providers.   She sees Dr. Chalmers Cater endocrinology who managers her diabetes and some of her other medications.  She ran out of ramipril, couldn't get it refills, so has been out several weeks.  Compliant with all other medications . Has no current c/o.  Doing fine otherwise.    Past Medical History  Diagnosis Date  . Diabetes mellitus     insulin-dependent  . Hypertension   . CAD (coronary artery disease)     mild nonobstructive disease, 30% Cfx lesion  . Dyslipidemia   . Hypothyroidism   . Allergy   . Arthritis   . Cataract   . Stroke ([place])    ROS as in subjective   Objective: BP 150/70 mmHg  Pulse 83  Wt 181 lb (82.101 kg)  General appearance: alert, no distress, WD/WN, pleasant AA female Neck: supple, no lymphadenopathy, no thyromegaly, no masses Heart: RRR, normal S1, S2, no murmurs Lungs: CTA bilaterally, no wheezes, rhonchi, or rales Ext: no edema Pulses: 1+ symmetric, upper and lower extremities, normal cap refill    Assessment: Encounter Diagnoses  Name Primary?  . Insulin dependent diabetes mellitus ([place]) Yes  . Essential hypertension   . Coronary artery disease due to lipid rich plaque   . Dyslipidemia   . Hypothyroidism, unspecified hypothyroidism type     Plan: Refilled Ramipril.   Will request most recent endocrinology notes and labs.   C/t rest of medications as usual, eat healthy low fat low salt diet, f/u in 41mo fasting.  Retina was seen today for new patient (initial visit).  Diagnoses and all orders for this visit:  Insulin dependent diabetes mellitus ([place])  Essential hypertension  Coronary artery disease due to lipid rich  plaque  Dyslipidemia  Hypothyroidism, unspecified hypothyroidism type  Other orders -     ramipril (ALTACE) 10 MG capsule; Take 1 capsule (10 mg total) by mouth daily. Reported on 04/17/2015

## 2015-05-14 ENCOUNTER — Encounter: Payer: Self-pay | Admitting: Medical

## 2015-05-15 ENCOUNTER — Encounter: Payer: Self-pay | Admitting: Medical

## 2015-06-19 ENCOUNTER — Encounter: Payer: Self-pay | Admitting: Family Medicine

## 2015-06-19 ENCOUNTER — Ambulatory Visit (INDEPENDENT_AMBULATORY_CARE_PROVIDER_SITE_OTHER): Payer: PRIVATE HEALTH INSURANCE | Admitting: Family Medicine

## 2015-06-19 VITALS — BP 132/62 | HR 64 | Temp 98.1°F | Wt 171.6 lb

## 2015-06-19 DIAGNOSIS — R05 Cough: Secondary | ICD-10-CM | POA: Diagnosis not present

## 2015-06-19 DIAGNOSIS — J069 Acute upper respiratory infection, unspecified: Secondary | ICD-10-CM

## 2015-06-19 DIAGNOSIS — R059 Cough, unspecified: Secondary | ICD-10-CM

## 2015-06-19 MED ORDER — AZITHROMYCIN 250 MG PO TABS: ORAL_TABLET | ORAL | Status: DC

## 2015-06-19 MED ORDER — BENZONATATE 200 MG PO CAPS: 200.0000 mg | ORAL_CAPSULE | Freq: Two times a day (BID) | ORAL | Status: DC | PRN

## 2015-06-19 NOTE — Progress Notes (Signed)
   Subjective:    Patient ID: Shelly Blevins, female    DOB: Jul 02, 1945, 70 y.o.   MRN: [id number]  HPI Chief Complaint  Patient presents with  . cough    cough for the last 4 days, sore throat from the drainage and coughing. fever, woke up in sweat   She is here with complaints of URI symptoms that started with sneezing and runny nose, dry cough for past 3-4 days that has progressed and now coughing up yellowish-phlegm. Coughing more at night.  Felt warm and feverish but did not check her temperature.  Denies headache, body aches, ear pain, sore throat, shortness of breath. Nausea, vomiting, diarrhea.  Has taken theraflu and sudafed.  History of bronchitis, no pneumonia.  Positive sick contacts, works at a nursing home. She did get her flu shot.  Does not smoke.    Past Medical History  Diagnosis Date  . Diabetes mellitus     insulin-dependent  . Hypertension   . CAD (coronary artery disease)     mild nonobstructive disease, 30% Cfx lesion  . Dyslipidemia   . Hypothyroidism   . Allergy   . Arthritis   . Cataract   . Stroke [facility])     Review of Systems Pertinent positives and negatives in the history of present illness.     Objective:   Physical Exam BP 132/62 mmHg  Pulse 64  Temp(Src) 98.1 F (36.7 C) (Oral)  Wt 171 lb 9.6 oz (77.837 kg)  Alert and in no distress. No sinus tenderness.  Tympanic membranes and canals are normal. Pharyngeal area is mildly erythematous without exudate. Neck is supple without adenopathy or thyromegaly. Cardiac exam shows a regular sinus rhythm without murmurs or gallops. Lungs are clear to auscultation.      Assessment & Plan:  Cough  Acute URI  Discussed that her symptoms appear to be related to viral etiology. Recommend symptomatically treatment for next 2-3 days and then if she is not improving she will start the azithromycin. Z-Pak prescribed. tessalon prescribed for cough if over the counter is not effective. She will try  robitussin DM, Mucinex DM, or Delsym. Tylenol for fever, pain. Recommend staying well hydrated. She will call/return if not back to baseline after completing the antibiotic.

## 2015-06-19 NOTE — Patient Instructions (Signed)
Your symptoms appear to be viral related but cannot entirely rule out a bacterial infection. Recommend that you treat your symptoms for the next 2-3 days and if you are not improving by Friday you can start the antibiotic.  Take Delsym or Mucinex DM for cough and you may also try Tessalon I prescribed for cough. Stay well hydrated. Take tylenol for fever, aches and pain.  If you are not 100% back to normal after completing the antibiotic let me know.  Cough, Adult Coughing is a reflex that clears your throat and your airways. Coughing helps to heal and protect your lungs. It is normal to cough occasionally, but a cough that happens with other symptoms or lasts a long time may be a sign of a condition that needs treatment. A cough may last only 2-3 weeks (acute), or it may last longer than 8 weeks (chronic). CAUSES Coughing is commonly caused by:  Breathing in substances that irritate your lungs.  A viral or bacterial respiratory infection.  Allergies.  Asthma.  Postnasal drip.  Smoking.  Acid backing up from the stomach into the esophagus (gastroesophageal reflux).  Certain medicines.  Chronic lung problems, including COPD (or rarely, lung cancer).  Other medical conditions such as heart failure. HOME CARE INSTRUCTIONS  Pay attention to any changes in your symptoms. Take these actions to help with your discomfort:  Take medicines only as told by your health care provider.  If you were prescribed an antibiotic medicine, take it as told by your health care provider. Do not stop taking the antibiotic even if you start to feel better.  Talk with your health care provider before you take a cough suppressant medicine.  Drink enough fluid to keep your urine clear or pale yellow.  If the air is dry, use a cold steam vaporizer or humidifier in your bedroom or your home to help loosen secretions.  Avoid anything that causes you to cough at work or at home.  If your cough is worse at  night, try sleeping in a semi-upright position.  Avoid cigarette smoke. If you smoke, quit smoking. If you need help quitting, ask your health care provider.  Avoid caffeine.  Avoid alcohol.  Rest as needed. SEEK MEDICAL CARE IF:   You have new symptoms.  You cough up pus.  Your cough does not get better after 2-3 weeks, or your cough gets worse.  You cannot control your cough with suppressant medicines and you are losing sleep.  You develop pain that is getting worse or pain that is not controlled with pain medicines.  You have a fever.  You have unexplained weight loss.  You have night sweats. SEEK IMMEDIATE MEDICAL CARE IF:  You cough up blood.  You have difficulty breathing.  Your heartbeat is very fast.   This information is not intended to replace advice given to you by your health care provider. Make sure you discuss any questions you have with your health care provider.   Document Released: 09/12/2010 Document Revised: 12/05/2014 Document Reviewed: 05/23/2014 Elsevier Interactive Patient Education [employer].

## 2015-07-16 ENCOUNTER — Other Ambulatory Visit: Payer: Self-pay | Admitting: Medical

## 2015-07-17 ENCOUNTER — Ambulatory Visit (INDEPENDENT_AMBULATORY_CARE_PROVIDER_SITE_OTHER): Payer: PRIVATE HEALTH INSURANCE | Admitting: Internal Medicine

## 2015-07-17 ENCOUNTER — Encounter: Payer: Self-pay | Admitting: Internal Medicine

## 2015-07-17 VITALS — BP 126/65 | HR 61 | Ht 65.0 in | Wt 173.8 lb

## 2015-07-17 DIAGNOSIS — I2583 Coronary atherosclerosis due to lipid rich plaque: Principal | ICD-10-CM

## 2015-07-17 DIAGNOSIS — I2510 Atherosclerotic heart disease of native coronary artery without angina pectoris: Secondary | ICD-10-CM | POA: Diagnosis not present

## 2015-07-17 DIAGNOSIS — E785 Hyperlipidemia, unspecified: Secondary | ICD-10-CM | POA: Diagnosis not present

## 2015-07-17 DIAGNOSIS — I10 Essential (primary) hypertension: Secondary | ICD-10-CM

## 2015-07-17 NOTE — Progress Notes (Signed)
OFFICE NOTE  Chief Complaint:  Occasional lightheadedness and dizziness  Primary Care Physician: Shelly Oxford, PA-C  HPI:  Shelly Blevins is a 70 year old female with a history of coronary artery disease with mild nonobstructive coronary disease and 50% (not 30% as previously reported) proximal circumflex lesion. She also has diabetes, insulin dependent, hypertension, and dyslipidemia. There is an unfortunate sudden-death history in her family with her sister dying at age 51 and a brother who died of heart failure at 77. Unfortunately, her husband recently died and he had a lot of medical problems in November of 2013. She has had problems with hypothyroidism and was off her thyroid medication for some time but that has been reestablished. Telemetry she is noted some increase in shortness of breath with activities. 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 She also is describing some discomfort in the left upper chest area into the left shoulder. This is actually short-lived but sometimes is worse with exertion and relieved by rest. It is concerning to me for possible angina.  I saw Shelly Blevins back in the office today. Overall she is doing well. She denies any chest pain or worsening shortness of breath. At her last office visit she underwent a nuclear stress test which was negative for ischemia. Her diabetes is been fairly well controlled. She recently saw Shelly Blevins who is her endocrinologist. Blood pressure is at goal. Cholesterol is also followed by her endocrinologist.  Shelly Blevins returns today for follow-up. In general she is doing quite well. She denies any chest pain or worsening shortness of breath. She denies any palpitations. EKG appears stable. Diabetes is been fairly well controlled. She is on high-dose atorvastatin.  PMHx:  Past Medical History  Diagnosis Date  . Diabetes mellitus     insulin-dependent  . Hypertension   . CAD  (coronary artery disease)     mild nonobstructive disease, 30% Cfx lesion  . Dyslipidemia   . Hypothyroidism   . Allergy   . Arthritis   . Cataract   . Stroke [facility])     Past Surgical History  Procedure Laterality Date  . Transthoracic echocardiogram  2008    normal study   . Nm myocar perf wall motion  2009    lexiscan myoview - perfusion defect in anterior myociaral region (breast attenuation) - low risk scan - EF 69%  . Cardiac catheterization  02/17/2007    mild CAD with 50% prox L Cfx stenosis, normal LV function (Dr. Norlene Duel)  . Cholecystectomy    . Abdominal hysterectomy    . Eye surgery      FAMHx:  Family History  Problem Relation Age of Onset  . Sudden death Sister     died at 42  . Heart failure Brother     died at 10  . Heart failure Mother   . Diabetes Mother     SOCHx:   reports that she quit smoking about 29 years ago. She has never used smokeless tobacco. She reports that she does not drink alcohol or use illicit drugs.  ALLERGIES:  No Known Allergies  ROS: A comprehensive review of systems was negative.  HOME MEDS: Current Outpatient Prescriptions  Medication Sig Dispense Refill  . amLODipine (NORVASC) 5 MG tablet Take 5 mg by mouth daily.    Marland Kitchen aspirin 81 MG tablet Take 81 mg by mouth daily.     Marland Kitchen atorvastatin (LIPITOR) 80 MG  tablet Take 80 mg by mouth daily.      . benzonatate (TESSALON) 200 MG capsule Take 1 capsule (200 mg total) by mouth 2 (two) times daily as needed for cough. 20 capsule 0  . Coenzyme Q10 (CO Q-10 PO) Take 1 capsule by mouth daily.    . cyanocobalamin 500 MCG tablet Take 500 mcg by mouth daily. Reported on 04/17/2015    . hydrochlorothiazide (HYDRODIURIL) 25 MG tablet Take 25 mg by mouth daily.    Marland Kitchen latanoprost (XALATAN) 0.005 % ophthalmic solution Place 1 drop into both eyes at bedtime.      Marland Kitchen levothyroxine (SYNTHROID, LEVOTHROID) 50 MCG tablet Take 50 mcg by mouth daily before breakfast.    . loratadine (CLARITIN) 10 MG  tablet Take 10 mg by mouth daily as needed for allergies. Reported on 04/17/2015    . metFORMIN (GLUCOPHAGE) 1000 MG tablet Take 1,000 mg by mouth 2 (two) times daily with a meal. Reported on 04/17/2015    . NOVOLOG MIX 70/30 FLEXPEN (70-30) 100 UNIT/ML FlexPen INJECT 70 UNITS IN MORNING AND 60 IN EVENING  11  . ramipril (ALTACE) 10 MG capsule TAKE ONE CAPSULE EVERY DAY 90 capsule 0  . SitaGLIPtin-MetFORMIN HCl (JANUMET XR) 50-500 MG TB24 Take 1 tablet by mouth 2 (two) times daily.     No current facility-administered medications for this visit.    LABS/IMAGING: No results found for this or any previous visit (from the past 48 hour(s)). No results found.  VITALS: BP 126/65 mmHg  Pulse 61  Ht 5\' 5"  (1.651 m)  Wt 173 lb 12.8 oz (78.835 kg)  BMI 28.92 kg/m2  EXAM: General appearance: alert and no distress Neck: no carotid bruit and no JVD Lungs: clear to auscultation bilaterally Heart: regular rate and rhythm, S1, S2 normal, no murmur, click, rub or gallop Abdomen: soft, non-tender; bowel sounds normal; no masses,  no organomegaly Extremities: extremities normal, atraumatic, no cyanosis or edema Pulses: 2+ and symmetric Skin: Skin color, texture, turgor normal. No rashes or lesions Neurologic: Grossly normal  EKG: Normal sinus rhythm at 61, nonspecific T wave changes, specifically flat T waves with an indistinct QT interval  ASSESSMENT: 1. Moderate single vessel coronary disease with a 50% circumflex lesion and 2009 - negative nuclear stress test in 2015 2. Insulin-dependent diabetes 3. Hypertension 4. Dyslipidemia 5. Family history of premature sudden cardiac death Jul 25, 2022. Dyspnea/chest pain  PLAN: 1.   Shelly Blevins is doing well without recurrent chest pain or shortness of breath. Cholesterol and diabetes are followed by her endocrinologist. These are reportedly well controlled. Blood pressure is at goal on her current medications. EKG shows sinus rhythm with nonspecific T-wave  changes and diffuse flattening, with an indistinct QT interval. She's not on any QT prolonging drugs. I recommend continuing her current medications and i will be happy to see her back in a year.  Pixie Casino, MD, [facility] Attending Cardiologist [place] [facility] Amahia [place] 07/17/2015, 6:34 PM

## 2015-07-17 NOTE — Patient Instructions (Signed)
Your physician wants you to follow-up in: 1 year with Dr. Hilty. You will receive a reminder letter in the mail two months in advance. If you don't receive a letter, please call our office to schedule the follow-up appointment.  

## 2015-10-04 ENCOUNTER — Telehealth: Payer: Self-pay | Admitting: Medical

## 2015-10-04 MED ORDER — AMLODIPINE BESYLATE 5 MG PO TABS: 5.0000 mg | ORAL_TABLET | Freq: Every day | ORAL | Status: DC

## 2015-10-04 NOTE — Telephone Encounter (Signed)
Filled and pt was supposed to follow up in April. Appt made for next week

## 2015-10-04 NOTE — Telephone Encounter (Signed)
Pt came in and stated she is out of medication, Amlodipine Besylate. We have never prescribed but it is on her med list. Please send to [facility].

## 2015-10-04 NOTE — Telephone Encounter (Signed)
pls refill amlodipine, double check my last note to see when I advised f/u visit.

## 2015-10-09 ENCOUNTER — Telehealth: Payer: Self-pay | Admitting: Medical

## 2015-10-09 ENCOUNTER — Encounter: Payer: Self-pay | Admitting: Medical

## 2015-10-09 ENCOUNTER — Ambulatory Visit (INDEPENDENT_AMBULATORY_CARE_PROVIDER_SITE_OTHER): Payer: PRIVATE HEALTH INSURANCE | Admitting: Medical

## 2015-10-09 VITALS — BP 138/78 | HR 64 | Wt 176.0 lb

## 2015-10-09 DIAGNOSIS — Z794 Long term (current) use of insulin: Secondary | ICD-10-CM

## 2015-10-09 DIAGNOSIS — E039 Hypothyroidism, unspecified: Secondary | ICD-10-CM | POA: Diagnosis not present

## 2015-10-09 DIAGNOSIS — E119 Type 2 diabetes mellitus without complications: Secondary | ICD-10-CM

## 2015-10-09 DIAGNOSIS — Z23 Encounter for immunization: Secondary | ICD-10-CM | POA: Diagnosis not present

## 2015-10-09 DIAGNOSIS — Z7185 Encounter for immunization safety counseling: Secondary | ICD-10-CM

## 2015-10-09 DIAGNOSIS — IMO0001 Reserved for inherently not codable concepts without codable children: Secondary | ICD-10-CM

## 2015-10-09 DIAGNOSIS — E785 Hyperlipidemia, unspecified: Secondary | ICD-10-CM | POA: Diagnosis not present

## 2015-10-09 DIAGNOSIS — Z129 Encounter for screening for malignant neoplasm, site unspecified: Secondary | ICD-10-CM

## 2015-10-09 DIAGNOSIS — I10 Essential (primary) hypertension: Secondary | ICD-10-CM | POA: Diagnosis not present

## 2015-10-09 DIAGNOSIS — Z7189 Other specified counseling: Secondary | ICD-10-CM

## 2015-10-09 DIAGNOSIS — I2510 Atherosclerotic heart disease of native coronary artery without angina pectoris: Secondary | ICD-10-CM | POA: Diagnosis not present

## 2015-10-09 DIAGNOSIS — I2583 Coronary atherosclerosis due to lipid rich plaque: Secondary | ICD-10-CM

## 2015-10-09 NOTE — Telephone Encounter (Signed)
Call and remind her to call insurer about coverage for shingles and Tdap vaccines.  Give her info on attorney to help her with a will, living will, and health care power of attorney.    I have a recommendations:  Adela Lank Email: [email or web address] Tel:  [phone] [place], [place]?  Can also check with credit union or other local attorney.  This guy is very reasonable though on costs.

## 2015-10-09 NOTE — Progress Notes (Signed)
Subjective: Chief Complaint  Patient presents with  . Follow-up    not fasting. ate an hour ago. no problems or concerns   Here for f/u.   I saw her as a new patient in January.  Was due back in 51mo, but its been 6 months.    She sees Dr. Chalmers Cater endocrinology, last saw her 3-4 weeks ago.   Had labs, and reportedly labs were fine except for HgbA1C, which was 8%.  Had a modification of her insulin regimen recently.  compliant with medications.   Gets flu shot at work.    Doesn't think she has ever had pneumonia vaccine, hasn't had shingle vaccine.  No particular c/o today, not exercising.    Still works, consider returning as she will have worked 40 years next month.   She takes care of her 55yo granddaughter that has cerebral palsy, helps take care of her older sister.  If something happens to her, her other granddaughter would help take care of her disabled granddaughter.   Past Medical History  Diagnosis Date  . Diabetes mellitus     insulin-dependent  . Hypertension   . CAD (coronary artery disease)     mild nonobstructive disease, 30% Cfx lesion  . Dyslipidemia   . Hypothyroidism   . Allergy   . Arthritis   . Cataract   . Stroke [facility])     Past Surgical History  Procedure Laterality Date  . Transthoracic echocardiogram  2008    normal study   . Nm myocar perf wall motion  2009    lexiscan myoview - perfusion defect in anterior myociaral region (breast attenuation) - low risk scan - EF 69%  . Cardiac catheterization  02/17/2007    mild CAD with 50% prox L Cfx stenosis, normal LV function (Dr. Norlene Duel)  . Cholecystectomy    . Abdominal hysterectomy    . Eye surgery     Current Outpatient Prescriptions on File Prior to Visit  Medication Sig Dispense Refill  . amLODipine (NORVASC) 5 MG tablet Take 1 tablet (5 mg total) by mouth daily. 30 tablet 0  . aspirin 81 MG tablet Take 81 mg by mouth daily.     Marland Kitchen atorvastatin (LIPITOR) 80 MG tablet Take 80 mg by mouth daily.       . Coenzyme Q10 (CO Q-10 PO) Take 1 capsule by mouth daily.    . hydrochlorothiazide (HYDRODIURIL) 25 MG tablet Take 25 mg by mouth daily.    Marland Kitchen latanoprost (XALATAN) 0.005 % ophthalmic solution Place 1 drop into both eyes at bedtime.      Marland Kitchen levothyroxine (SYNTHROID, LEVOTHROID) 50 MCG tablet Take 50 mcg by mouth daily before breakfast.    . NOVOLOG MIX 70/30 FLEXPEN (70-30) 100 UNIT/ML FlexPen INJECT 70 UNITS IN MORNING AND 60 IN EVENING  11  . ramipril (ALTACE) 10 MG capsule TAKE ONE CAPSULE EVERY DAY 90 capsule 0  . cyanocobalamin 500 MCG tablet Take 500 mcg by mouth daily. Reported on 10/09/2015    . loratadine (CLARITIN) 10 MG tablet Take 10 mg by mouth daily as needed for allergies. Reported on 10/09/2015    . SitaGLIPtin-MetFORMIN HCl (JANUMET XR) 50-500 MG TB24 Take 1 tablet by mouth 2 (two) times daily.     No current facility-administered medications on file prior to visit.     Objective: BP 138/78 mmHg  Pulse 64  Wt 176 lb (79.833 kg)  Wt Readings from Last 3 Encounters:  10/09/15 176 lb (79.833 kg)  07/17/15 173 lb 12.8 oz (78.835 kg)  06/19/15 171 lb 9.6 oz (77.837 kg)   General appearance: alert, no distress, WD/WN, pleasant AA female Neck: supple, no lymphadenopathy, no thyromegaly, no masses, no bruits Heart: RRR, normal S1, S2, no murmurs Lungs: CTA bilaterally, no wheezes, rhonchi, or rales Ext:no edema Pulses: 2+ symmetric, upper and lower extremities, normal cap refill     Assessment: Encounter Diagnoses  Name Primary?  . Essential hypertension   . Need for prophylactic vaccination against Streptococcus pneumoniae (pneumococcus)   . Coronary artery disease due to lipid rich plaque   . Insulin dependent diabetes mellitus ([place])   . Dyslipidemia   . Hypothyroidism, unspecified hypothyroidism type   . Vaccine counseling   . Advanced directives, counseling/discussion   . Cancer screening Yes     Plan: Currently her hypertension, diabetes, dyslipidemia,  and hypothyroidism seems to be managed by endocrinology.  We had her sign to get the most recent visit notes and labs.  I reviewed the 1/ 2017 endocrinology visit notes and labs.  hgbA1C is hovering in the 8% range.  Lipids and BP under good control  Counseled on the pneumococcal vaccine.  Vaccine information sheet given.  Pneumococcal vaccine Prevnar 13 given after consent obtained.  She will be due for Pneumococcal 23 next year.    Advised flu shot yearly.  Advised she check insurance coverage for shingles and Td vaccine.  Counseled on cancer screening, primarily mammogram, routine examination in the office here.   No need for additional pap, reviewed 2013 pap.   May consider Cologuard moving forward  counseled on advanced directives.  She will look into this as she has none in place, nor does she have a will.  Advised she consult attorney to help her draw these up.    Expressed sympathy for the loss of one of her daughters and one of her sisters earlier this year.   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

## 2015-10-10 NOTE — Telephone Encounter (Signed)
LM for pt per DPR we can leave detailed msg

## 2015-10-13 ENCOUNTER — Other Ambulatory Visit: Payer: Self-pay | Admitting: Medical

## 2015-10-25 ENCOUNTER — Encounter: Payer: Self-pay | Admitting: Medical

## 2015-10-29 ENCOUNTER — Telehealth: Payer: Self-pay

## 2015-10-29 NOTE — Telephone Encounter (Signed)
Most recent labs rcvd via fax. Placed in your folder for review. Shelly Blevins

## 2015-11-01 ENCOUNTER — Other Ambulatory Visit: Payer: Self-pay | Admitting: Medical

## 2015-11-04 ENCOUNTER — Other Ambulatory Visit: Payer: Self-pay | Admitting: Medical

## 2015-11-04 DIAGNOSIS — Z1231 Encounter for screening mammogram for malignant neoplasm of breast: Secondary | ICD-10-CM

## 2015-12-03 ENCOUNTER — Other Ambulatory Visit: Payer: Self-pay | Admitting: Medical

## 2015-12-05 ENCOUNTER — Ambulatory Visit
Admission: RE | Admit: 2015-12-05 | Discharge: 2015-12-05 | Disposition: A | Discharge status: Home / Self Care | Payer: PRIVATE HEALTH INSURANCE | Source: Ambulatory Visit | Attending: Medical | Admitting: Medical

## 2015-12-05 ENCOUNTER — Ambulatory Visit: Payer: PRIVATE HEALTH INSURANCE

## 2015-12-05 DIAGNOSIS — Z1231 Encounter for screening mammogram for malignant neoplasm of breast: Secondary | ICD-10-CM

## 2015-12-09 ENCOUNTER — Encounter: Payer: Self-pay | Admitting: Medical

## 2015-12-09 ENCOUNTER — Ambulatory Visit (INDEPENDENT_AMBULATORY_CARE_PROVIDER_SITE_OTHER): Payer: PRIVATE HEALTH INSURANCE | Admitting: Medical

## 2015-12-09 VITALS — BP 110/60 | HR 76 | Temp 98.3°F | Resp 16 | Wt 171.0 lb

## 2015-12-09 DIAGNOSIS — L209 Atopic dermatitis, unspecified: Secondary | ICD-10-CM | POA: Diagnosis not present

## 2015-12-09 DIAGNOSIS — H8110 Benign paroxysmal vertigo, unspecified ear: Secondary | ICD-10-CM

## 2015-12-09 DIAGNOSIS — L853 Xerosis cutis: Secondary | ICD-10-CM

## 2015-12-09 DIAGNOSIS — Z23 Encounter for immunization: Secondary | ICD-10-CM

## 2015-12-09 DIAGNOSIS — J3489 Other specified disorders of nose and nasal sinuses: Secondary | ICD-10-CM | POA: Diagnosis not present

## 2015-12-09 DIAGNOSIS — L299 Pruritus, unspecified: Secondary | ICD-10-CM | POA: Diagnosis not present

## 2015-12-09 DIAGNOSIS — L732 Hidradenitis suppurativa: Secondary | ICD-10-CM

## 2015-12-09 MED ORDER — AMOXICILLIN 875 MG PO TABS: 875.0000 mg | ORAL_TABLET | Freq: Two times a day (BID) | ORAL | 0 refills | Status: DC

## 2015-12-09 MED ORDER — HYDROXYZINE HCL 10 MG PO TABS: 10.0000 mg | ORAL_TABLET | Freq: Two times a day (BID) | ORAL | 0 refills | Status: DC

## 2015-12-09 NOTE — Progress Notes (Signed)
Subjective: Chief Complaint  Patient presents with  . Other    itching on skin x 2 weeks. boil under Rt arm. c/o sinus drainage. wants shingles vaccine.    Here for c/o itching on face, ears, arms x 2 weeks.  Skin feels a little rough on cheeks and back of neck.    Using neosporin topically.  Had whelp on right chest, used OTC cream and it resolved.   Used blue star ointment.  No prior similar.  She does get allergy symptoms in the fall sometimes.  No other exposures  Having some sinus pressure, getting up some mucous in the mornings.  No fever, no cough, no sore throat.  No sneezing, runny nose, head or chest congestion.  Has boil under right axilla x 2 days.   No prior I&D for similar.  This is the second time she has had this.  The last one years ago drained on its own.    She checked insurance and carrier covers the shingles vaccine.  Past Medical History:  Diagnosis Date  . Allergy   . Arthritis   . CAD (coronary artery disease)    mild nonobstructive disease, 30% Cfx lesion  . Cataract   . Diabetes mellitus    insulin-dependent; Dr. Chalmers Cater  . Dyslipidemia   . Hypertension   . Hypothyroidism    Dr. Chalmers Cater  . Stroke [facility])    Current Outpatient Prescriptions on File Prior to Visit  Medication Sig Dispense Refill  . amLODipine (NORVASC) 5 MG tablet TAKE 1 TABLET EVERY DAY 30 tablet 1  . aspirin 81 MG tablet Take 81 mg by mouth daily.     Marland Kitchen atorvastatin (LIPITOR) 80 MG tablet Take 80 mg by mouth daily.      . Coenzyme Q10 (CO Q-10 PO) Take 1 capsule by mouth daily.    . cyanocobalamin 500 MCG tablet Take 500 mcg by mouth daily. Reported on 10/09/2015    . hydrochlorothiazide (HYDRODIURIL) 25 MG tablet Take 25 mg by mouth daily.    Marland Kitchen latanoprost (XALATAN) 0.005 % ophthalmic solution Place 1 drop into both eyes at bedtime.      Marland Kitchen levothyroxine (SYNTHROID, LEVOTHROID) 50 MCG tablet Take 50 mcg by mouth daily before breakfast.    . NOVOLOG MIX 70/30 FLEXPEN (70-30) 100 UNIT/ML  FlexPen INJECT 70 UNITS am, 10 units at lunch and 60 units at bedtime.  11  . ramipril (ALTACE) 10 MG capsule TAKE ONE CAPSULE EVERY DAY 90 capsule 1  . SitaGLIPtin-MetFORMIN HCl (JANUMET XR) 50-500 MG TB24 Take 1 tablet by mouth 2 (two) times daily.    Marland Kitchen loratadine (CLARITIN) 10 MG tablet Take 10 mg by mouth daily as needed for allergies. Reported on 10/09/2015     No current facility-administered medications on file prior to visit.    ROS as in subjective   Objective: BP 110/60   Pulse 76   Temp 98.3 F (36.8 C) (Oral)   Resp 16   Wt 171 lb (77.6 kg)   SpO2 98%   BMI 28.46 kg/m   Gen: wd, wn, nad Skin mild patch of rough skin on back of neck, right axilla with tender 1cm nodule suggestive of cystic lesion, no induration, fluctuance, warmth or drainage HEENT: normocephalic, sclerae anicteric, conjunctiva pink and moist, TMs pearly, nares patent, no discharge or erythema, pharynx normal, tonsils unremarkable Oral cavity: MMM, no lesions Neck: supple, no lymphadenopathy, no thyromegaly, no masses Heart: RRR, normal S1, S2, no murmurs Lungs: CTA bilaterally, no  wheezes, rhonchi, or rales     Assessment: Encounter Diagnoses  Name Primary?  . Hidradenitis axillaris Yes  . Sinus pressure   . Atopic dermatitis   . Benign paroxysmal positional vertigo, unspecified laterality   . Need for shingles vaccine   . Dry skin   . Pruritic condition     Plan: hidradenitis - advised warm compresses, begin amoxicillin.  F/u if not resolved by end of the week Sinus pressure - hydrate well, begin amoxicillin, hydroxyzine atopic dermatitis, pruritic condition - begin hydroxyzine.  likely environmental allergen related Advised daily moisturizing lotion Counseled on the zoster virus vaccine.  Vaccine information sheet given. Zostavax vaccine given after consent obtained. She will get free flu shot at work.  [place] was seen today for other.  Diagnoses and all orders for this  visit:  Hidradenitis axillaris  Sinus pressure  Atopic dermatitis  Benign paroxysmal positional vertigo, unspecified laterality  Need for shingles vaccine  Dry skin  Pruritic condition  Other orders -     hydrOXYzine (ATARAX/VISTARIL) 10 MG tablet; Take 1 tablet (10 mg total) by mouth 2 (two) times daily. -     amoxicillin (AMOXIL) 875 MG tablet; Take 1 tablet (875 mg total) by mouth 2 (two) times daily.

## 2015-12-09 NOTE — Addendum Note (Signed)
Addended by: Arley Phenix L on: 12/09/2015 03:57 PM   Modules accepted: Orders

## 2016-02-04 ENCOUNTER — Other Ambulatory Visit: Payer: Self-pay | Admitting: Medical

## 2016-04-13 ENCOUNTER — Encounter: Payer: PRIVATE HEALTH INSURANCE | Admitting: Medical

## 2016-04-15 ENCOUNTER — Encounter: Payer: PRIVATE HEALTH INSURANCE | Admitting: Medical

## 2016-04-16 ENCOUNTER — Other Ambulatory Visit: Payer: Self-pay | Admitting: Medical

## 2016-04-22 ENCOUNTER — Encounter: Payer: Self-pay | Admitting: Medical

## 2016-04-22 ENCOUNTER — Ambulatory Visit (INDEPENDENT_AMBULATORY_CARE_PROVIDER_SITE_OTHER): Payer: PRIVATE HEALTH INSURANCE | Admitting: Medical

## 2016-04-22 VITALS — BP 134/70 | HR 57 | Wt 168.2 lb

## 2016-04-22 DIAGNOSIS — J349 Unspecified disorder of nose and nasal sinuses: Secondary | ICD-10-CM

## 2016-04-22 DIAGNOSIS — R270 Ataxia, unspecified: Secondary | ICD-10-CM

## 2016-04-22 DIAGNOSIS — R42 Dizziness and giddiness: Secondary | ICD-10-CM

## 2016-04-22 MED ORDER — HYDROXYZINE HCL 10 MG PO TABS: 10.0000 mg | ORAL_TABLET | Freq: Two times a day (BID) | ORAL | 0 refills | Status: DC

## 2016-04-22 MED ORDER — AMOXICILLIN 500 MG PO TABS: ORAL_TABLET | ORAL | 0 refills | Status: DC

## 2016-04-22 NOTE — Progress Notes (Signed)
Subjective: Chief Complaint  Patient presents with  . med check    med check    Here for f/u.    She sees Dr. Chalmers Cater endocrinology, Gets flu shot at work.    Been having sinus pressure, sometimes dizziness, off balance feeling, some post nasal drainage.    Maybe few weeks.  Has had sick contacts.   Using nothing for the symptoms.   Past Medical History:  Diagnosis Date  . Allergy   . Arthritis   . CAD (coronary artery disease)    mild nonobstructive disease, 30% Cfx lesion  . Cataract   . Diabetes mellitus    insulin-dependent; Dr. Chalmers Cater  . Dyslipidemia   . Hypertension   . Hypothyroidism    Dr. Chalmers Cater  . Stroke [facility])     Past Surgical History:  Procedure Laterality Date  . ABDOMINAL HYSTERECTOMY    . CARDIAC CATHETERIZATION  02/17/2007   mild CAD with 50% prox L Cfx stenosis, normal LV function (Dr. Norlene Duel)  . CHOLECYSTECTOMY    . EYE SURGERY    . NM MYOCAR PERF WALL MOTION  2009   lexiscan myoview - perfusion defect in anterior myociaral region (breast attenuation) - low risk scan - EF 69%  . TRANSTHORACIC ECHOCARDIOGRAM  2008   normal study    Current Outpatient Prescriptions on File Prior to Visit  Medication Sig Dispense Refill  . amLODipine (NORVASC) 5 MG tablet TAKE 1 TABLET EVERY DAY 30 tablet 11  . aspirin 81 MG tablet Take 81 mg by mouth daily.     Marland Kitchen atorvastatin (LIPITOR) 80 MG tablet Take 80 mg by mouth daily.      . Coenzyme Q10 (CO Q-10 PO) Take 1 capsule by mouth daily.    . hydrochlorothiazide (HYDRODIURIL) 25 MG tablet Take 25 mg by mouth daily.    Marland Kitchen latanoprost (XALATAN) 0.005 % ophthalmic solution Place 1 drop into both eyes at bedtime.      Marland Kitchen levothyroxine (SYNTHROID, LEVOTHROID) 50 MCG tablet Take 50 mcg by mouth daily before breakfast.    . NOVOLOG MIX 70/30 FLEXPEN (70-30) 100 UNIT/ML FlexPen INJECT 70 UNITS am, 10 units at lunch and 60 units at bedtime.  11  . ramipril (ALTACE) 10 MG capsule TAKE ONE CAPSULE EVERY DAY 90 capsule 1  .  SitaGLIPtin-MetFORMIN HCl (JANUMET XR) 50-500 MG TB24 Take 1 tablet by mouth 2 (two) times daily.    Marland Kitchen loratadine (CLARITIN) 10 MG tablet Take 10 mg by mouth daily as needed for allergies. Reported on 10/09/2015     No current facility-administered medications on file prior to visit.      Objective: BP 134/70 (BP Location: Right Arm, Patient Position: Sitting)   Pulse (!) 57   Wt 168 lb 3.2 oz (76.3 kg)   SpO2 99%   BMI 27.99 kg/m   Wt Readings from Last 3 Encounters:  04/22/16 168 lb 3.2 oz (76.3 kg)  12/09/15 171 lb (77.6 kg)  10/09/15 176 lb (79.8 kg)   General appearance: alert, no distress, WD/WN, pleasant AA female +sinus pressure.Marland Kitchenotherwise ENT WNL Neck: supple, no lymphadenopathy, no thyromegaly, no masses, no bruits Heart: RRR, normal S1, S2, no murmurs Lungs: CTA bilaterally, no wheezes, rhonchi, or rales Ext:no edema Pulses: 2+ symmetric, upper and lower extremities, normal cap refill Somewhat unsteady with heel to toe, otherwise unremarkable neuro exam    Assessment: Encounter Diagnoses  Name Primary?  . Sinus problem Yes  . Ataxia   . Dizziness and giddiness  Plan: Begin amoxicillin, hydroxyzine, rest, hydrate well.   If not back to baseline within  2 weeks, consider head scan, or other eval for dizziness.   Currently her hypertension, diabetes, dyslipidemia, and hypothyroidism managed by endocrinology.   Advised flu shot yearly.    counseled on advanced directives.  She will look into this as she has none in place, nor does she have a will.  Advised she consult attorney to help her draw these up.    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   This is her 41st year on the job.  Shelly Blevins was seen today for med check.  Diagnoses and all orders for this visit:  Sinus problem  Ataxia  Dizziness and  giddiness  Other orders -     amoxicillin (AMOXIL) 500 MG tablet; 2 tablets po BID x 10 days -     hydrOXYzine (ATARAX/VISTARIL) 10 MG tablet; Take 1 tablet (10 mg total) by mouth 2 (two) times daily.

## 2016-04-22 NOTE — Patient Instructions (Signed)
The following is on attorney to help her with a will, living will, and health care power of attorney.     Adela Lank Email: [email or web address] Tel:  [phone] [place], [place]?  Can also check with credit union or other local attorney.  This guy is very reasonable though on costs.

## 2016-07-10 ENCOUNTER — Ambulatory Visit (INDEPENDENT_AMBULATORY_CARE_PROVIDER_SITE_OTHER): Payer: PRIVATE HEALTH INSURANCE | Admitting: Internal Medicine

## 2016-07-10 ENCOUNTER — Encounter: Payer: Self-pay | Admitting: *Deleted

## 2016-07-10 ENCOUNTER — Encounter: Payer: Self-pay | Admitting: Internal Medicine

## 2016-07-10 VITALS — BP 142/56 | HR 63 | Ht 65.0 in | Wt 171.8 lb

## 2016-07-10 DIAGNOSIS — I2510 Atherosclerotic heart disease of native coronary artery without angina pectoris: Secondary | ICD-10-CM

## 2016-07-10 DIAGNOSIS — I2583 Coronary atherosclerosis due to lipid rich plaque: Secondary | ICD-10-CM

## 2016-07-10 DIAGNOSIS — I10 Essential (primary) hypertension: Secondary | ICD-10-CM

## 2016-07-10 DIAGNOSIS — E785 Hyperlipidemia, unspecified: Secondary | ICD-10-CM

## 2016-07-10 NOTE — Progress Notes (Signed)
OFFICE NOTE  Chief Complaint:  Occasional lightheadedness and dizziness  Primary Care Physician: Crisoforo Oxford, PA-C  HPI:  Shelly Blevins is a 71 year old female with a history of coronary artery disease with mild nonobstructive coronary disease and 50% (not 30% as previously reported) proximal circumflex lesion. She also has diabetes, insulin dependent, hypertension, and dyslipidemia. There is an unfortunate sudden-death history in her family with her sister dying at age 38 and a brother who died of heart failure at 57. Unfortunately, her husband recently died and he had a lot of medical problems in November of 2013. She has had problems with hypothyroidism and was off her thyroid medication for some time but that has been reestablished. Telemetry she is noted some increase in shortness of breath with activities. 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 She also is describing some discomfort in the left upper chest area into the left shoulder. This is actually short-lived but sometimes is worse with exertion and relieved by rest. It is concerning to me for possible angina.  I saw Mrs. Wurm back in the office today. Overall she is doing well. She denies any chest pain or worsening shortness of breath. At her last office visit she underwent a nuclear stress test which was negative for ischemia. Her diabetes is been fairly well controlled. She recently saw Dr. Chalmers Cater who is her endocrinologist. Blood pressure is at goal. Cholesterol is also followed by her endocrinologist.  Mrs. Knock returns today for follow-up. In general she is doing quite well. She denies any chest pain or worsening shortness of breath. She denies any palpitations. EKG appears stable. Diabetes is been fairly well controlled. She is on high-dose atorvastatin.  07/10/2016  Mrs. Tyrell returns today for follow-up. Overall she seems to be doing well. Recently she's had some  recurrent lightheadedness and dizziness. She cause of vertigo however it's not clear that that's the issue. She has had some low blood sugars and her insulin was decreased somewhat. Since then her symptoms may been improved somewhat. Blood pressures recently well-controlled today.  PMHx:  Past Medical History:  Diagnosis Date  . Allergy   . Arthritis   . CAD (coronary artery disease)    mild nonobstructive disease, 30% Cfx lesion  . Cataract   . Diabetes mellitus    insulin-dependent; Dr. Chalmers Cater  . Dyslipidemia   . Hypertension   . Hypothyroidism    Dr. Chalmers Cater  . Stroke [facility])     Past Surgical History:  Procedure Laterality Date  . ABDOMINAL HYSTERECTOMY    . CARDIAC CATHETERIZATION  02/17/2007   mild CAD with 50% prox L Cfx stenosis, normal LV function (Dr. Norlene Duel)  . CHOLECYSTECTOMY    . EYE SURGERY    . NM MYOCAR PERF WALL MOTION  2009   lexiscan myoview - perfusion defect in anterior myociaral region (breast attenuation) - low risk scan - EF 69%  . TRANSTHORACIC ECHOCARDIOGRAM  2008   normal study     FAMHx:  Family History  Problem Relation Age of Onset  . Heart failure Mother   . Diabetes Mother   . Sudden death Sister     died at 73  . Heart failure Brother     died at 34    SOCHx:   reports that she quit smoking about 30 years ago. She has never used smokeless tobacco. She reports that she does not drink alcohol or use drugs.  ALLERGIES:  No Known Allergies  ROS: A comprehensive review of systems was negative.  HOME MEDS: Current Outpatient Prescriptions  Medication Sig Dispense Refill  . amLODipine (NORVASC) 5 MG tablet TAKE 1 TABLET EVERY DAY 30 tablet 11  . amoxicillin (AMOXIL) 500 MG tablet 2 tablets po BID x 10 days 40 tablet 0  . aspirin 81 MG tablet Take 81 mg by mouth daily.     Marland Kitchen atorvastatin (LIPITOR) 80 MG tablet Take 80 mg by mouth daily.      . Coenzyme Q10 (CO Q-10 PO) Take 1 capsule by mouth daily.    . hydrochlorothiazide  (HYDRODIURIL) 25 MG tablet Take 25 mg by mouth daily.    . hydrOXYzine (ATARAX/VISTARIL) 10 MG tablet Take 1 tablet (10 mg total) by mouth 2 (two) times daily. 45 tablet 0  . latanoprost (XALATAN) 0.005 % ophthalmic solution Place 1 drop into both eyes at bedtime.      Marland Kitchen levothyroxine (SYNTHROID, LEVOTHROID) 50 MCG tablet Take 50 mcg by mouth daily before breakfast.    . loratadine (CLARITIN) 10 MG tablet Take 10 mg by mouth daily as needed for allergies. Reported on 10/09/2015    . NOVOLOG MIX 70/30 FLEXPEN (70-30) 100 UNIT/ML FlexPen INJECT 70 UNITS am, 10 units at lunch and 60 units at bedtime.  11  . ramipril (ALTACE) 10 MG capsule TAKE ONE CAPSULE EVERY DAY 90 capsule 1  . SitaGLIPtin-MetFORMIN HCl (JANUMET XR) 50-500 MG TB24 Take 1 tablet by mouth 2 (two) times daily.     No current facility-administered medications for this visit.     LABS/IMAGING: No results found for this or any previous visit (from the past 48 hour(s)). No results found.  VITALS: BP (!) 142/56 (BP Location: Left Arm, Patient Position: Sitting, Cuff Size: Normal)   Pulse 63   Ht 5\' 5"  (1.651 m)   Wt 171 lb 12.8 oz (77.9 kg)   BMI 28.59 kg/m   EXAM: General appearance: alert and no distress Neck: no carotid bruit and no JVD Lungs: clear to auscultation bilaterally Heart: regular rate and rhythm, S1, S2 normal, no murmur, click, rub or gallop Abdomen: soft, non-tender; bowel sounds normal; no masses,  no organomegaly Extremities: extremities normal, atraumatic, no cyanosis or edema Pulses: 2+ and symmetric Skin: Skin color, texture, turgor normal. No rashes or lesions Neurologic: Grossly normal  EKG: Normal sinus rhythm at 63  ASSESSMENT: 1. Moderate single vessel coronary disease with a 50% circumflex lesion and 2009 - negative nuclear stress test in 2015 2. Insulin-dependent diabetes 3. Hypertension 4. Dyslipidemia 5. Family history of premature sudden cardiac death 07-Jun-2022. Dyspnea/chest  pain  PLAN: 1.   Mrs. Beechy seems to be doing about the same she was last year. She has no new chest pain or worsening shortness of breath. Her diabetes is well controlled in fact recently she had a decrease in her insulin. Blood pressure is near goal. She occasionally has some dizziness but that does not seem to have been affected by changes in her blood sugar. It may be positional vertigo. Follow-up with me annually or sooner as necessary.  Pixie Casino, MD, [facility] Attending Cardiologist [place] Jakaylah Schlafer 07/10/2016, 5:23 PM

## 2016-07-10 NOTE — Patient Instructions (Signed)
Your physician wants you to follow-up in: ONE YEAR with Dr. Hilty. You will receive a reminder letter in the mail two months in advance. If you don't receive a letter, please call our office to schedule the follow-up appointment.  

## 2016-08-11 ENCOUNTER — Encounter: Payer: Self-pay | Admitting: Family Medicine

## 2016-08-11 ENCOUNTER — Ambulatory Visit (INDEPENDENT_AMBULATORY_CARE_PROVIDER_SITE_OTHER): Payer: PRIVATE HEALTH INSURANCE | Admitting: Family Medicine

## 2016-08-11 VITALS — BP 120/70 | HR 64 | Wt 170.0 lb

## 2016-08-11 DIAGNOSIS — L03311 Cellulitis of abdominal wall: Secondary | ICD-10-CM | POA: Diagnosis not present

## 2016-08-11 DIAGNOSIS — IMO0001 Reserved for inherently not codable concepts without codable children: Secondary | ICD-10-CM

## 2016-08-11 DIAGNOSIS — L02211 Cutaneous abscess of abdominal wall: Secondary | ICD-10-CM | POA: Diagnosis not present

## 2016-08-11 DIAGNOSIS — Z794 Long term (current) use of insulin: Secondary | ICD-10-CM

## 2016-08-11 DIAGNOSIS — E119 Type 2 diabetes mellitus without complications: Secondary | ICD-10-CM

## 2016-08-11 MED ORDER — DOXYCYCLINE HYCLATE 100 MG PO TABS: 100.0000 mg | ORAL_TABLET | Freq: Two times a day (BID) | ORAL | 0 refills | Status: DC

## 2016-08-11 NOTE — Progress Notes (Signed)
   Subjective:    Patient ID: Shelly Blevins, female    DOB: [birth year], 71 y.o.   MRN: [id number]  HPI She is here for evaluation and treatment of the lower abdominal abscess. It is been draining the last day or so. He does have an underlying history of diabetes.  Review of Systems     Objective:   Physical Exam A 3 x 4 cm area of induration with surrounding erythema and evidence of peau d'orange is noted.       Assessment & Plan:  Abdominal wall abscess  Insulin dependent diabetes mellitus ([place]) The wound was prepped with Betadine and Xylocaine was injected. An I+D was performed. Very little exudate was expressed. The wound was packed with iodoform. I will place her on doxycycline and have her return here in 2 days.

## 2016-08-13 ENCOUNTER — Ambulatory Visit (INDEPENDENT_AMBULATORY_CARE_PROVIDER_SITE_OTHER): Payer: PRIVATE HEALTH INSURANCE | Admitting: Family Medicine

## 2016-08-13 DIAGNOSIS — L02211 Cutaneous abscess of abdominal wall: Secondary | ICD-10-CM

## 2016-08-13 DIAGNOSIS — L03311 Cellulitis of abdominal wall: Secondary | ICD-10-CM

## 2016-08-13 NOTE — Progress Notes (Signed)
   Subjective:    Patient ID: Shelly Blevins, female    DOB: [birth year], 71 y.o.   MRN: [id number]  HPI She is here for a recheck. She states that she is doing much better with the discomfort in her lower abdominal area.   Review of Systems     Objective:   Physical Exam Exam of the abdomen does show less erythema and swelling as well as tenderness.       Assessment & Plan:  Abdominal wall abscess  Abdominal wall cellulitis Packing was removed. She was instructed to use water irrigation and use an ointment to keep the area open. She will return here if continued difficulty.

## 2016-10-15 ENCOUNTER — Other Ambulatory Visit: Payer: Self-pay | Admitting: Medical

## 2016-10-15 NOTE — Telephone Encounter (Signed)
Called and left a detailed message on pt's vm about bp med received from pharmacy. Trying to find out since she see Cardiology if they are going to be refilling it or does It need to come from [facility]. If it comes from [facility], it has been a year since pt has been seen for HTN and will need to be seen. We can give her a 30 day supply only until appt.

## 2016-10-26 ENCOUNTER — Other Ambulatory Visit: Payer: Self-pay | Admitting: Medical

## 2016-10-26 DIAGNOSIS — Z1231 Encounter for screening mammogram for malignant neoplasm of breast: Secondary | ICD-10-CM

## 2016-11-02 ENCOUNTER — Encounter: Payer: Self-pay | Admitting: Medical

## 2016-11-02 ENCOUNTER — Ambulatory Visit (INDEPENDENT_AMBULATORY_CARE_PROVIDER_SITE_OTHER): Payer: PRIVATE HEALTH INSURANCE | Admitting: Medical

## 2016-11-02 VITALS — Wt 171.2 lb

## 2016-11-02 DIAGNOSIS — Z7189 Other specified counseling: Secondary | ICD-10-CM

## 2016-11-02 DIAGNOSIS — I2583 Coronary atherosclerosis due to lipid rich plaque: Secondary | ICD-10-CM

## 2016-11-02 DIAGNOSIS — I10 Essential (primary) hypertension: Secondary | ICD-10-CM | POA: Diagnosis not present

## 2016-11-02 DIAGNOSIS — I2510 Atherosclerotic heart disease of native coronary artery without angina pectoris: Secondary | ICD-10-CM | POA: Diagnosis not present

## 2016-11-02 DIAGNOSIS — E039 Hypothyroidism, unspecified: Secondary | ICD-10-CM | POA: Diagnosis not present

## 2016-11-02 DIAGNOSIS — W19XXXA Unspecified fall, initial encounter: Secondary | ICD-10-CM | POA: Diagnosis not present

## 2016-11-02 DIAGNOSIS — E119 Type 2 diabetes mellitus without complications: Secondary | ICD-10-CM | POA: Insufficient documentation

## 2016-11-02 DIAGNOSIS — R42 Dizziness and giddiness: Secondary | ICD-10-CM | POA: Insufficient documentation

## 2016-11-02 DIAGNOSIS — R270 Ataxia, unspecified: Secondary | ICD-10-CM | POA: Insufficient documentation

## 2016-11-02 DIAGNOSIS — E2839 Other primary ovarian failure: Secondary | ICD-10-CM | POA: Insufficient documentation

## 2016-11-02 DIAGNOSIS — E785 Hyperlipidemia, unspecified: Secondary | ICD-10-CM

## 2016-11-02 DIAGNOSIS — Z794 Long term (current) use of insulin: Secondary | ICD-10-CM

## 2016-11-02 DIAGNOSIS — Z7185 Encounter for immunization safety counseling: Secondary | ICD-10-CM

## 2016-11-02 LAB — BASIC METABOLIC PANEL
BUN: 14 mg/dL (ref 7–25)
CHLORIDE: 99 mmol/L (ref 98–110)
CO2: 29 mmol/L (ref 20–32)
Calcium: 9.8 mg/dL (ref 8.6–10.4)
Creat: 0.9 mg/dL (ref 0.60–0.93)
Glucose, Bld: 141 mg/dL — ABNORMAL HIGH (ref 65–99)
POTASSIUM: 4.5 mmol/L (ref 3.5–5.3)
SODIUM: 139 mmol/L (ref 135–146)

## 2016-11-02 LAB — TSH: TSH: 1.52 mIU/L

## 2016-11-02 LAB — CBC
HEMATOCRIT: 36.5 % (ref 35.0–45.0)
HEMOGLOBIN: 12 g/dL (ref 11.7–15.5)
MCH: 28.9 pg (ref 27.0–33.0)
MCHC: 32.9 g/dL (ref 32.0–36.0)
MCV: 88 fL (ref 80.0–100.0)
MPV: 10.8 fL (ref 7.5–12.5)
Platelets: 236 10*3/uL (ref 140–400)
RBC: 4.15 MIL/uL (ref 3.80–5.10)
RDW: 15 % (ref 11.0–15.0)
WBC: 6.5 10*3/uL (ref 4.0–10.5)

## 2016-11-02 LAB — T4, FREE: FREE T4: 0.9 ng/dL (ref 0.8–1.8)

## 2016-11-02 NOTE — Progress Notes (Addendum)
Subjective: Chief Complaint  Patient presents with  . med check    med check , having some dizzness at times.   Here for med check.  Medical team: Dr. Lyman Bishop, cardiology Dr. Chalmers Cater, endocrinology  Had recent fall out in the yard.   May have stumbled, was in a close space, thinks she tripped over a water hose.   Landed with arms out front, scratched right forearm.   No syncope, no head injury. DOI: 10/28/16.  Gets dizziness lately a lot. Staggers on her feet.   No confusion.  No slurred speech.  Memory has worsened over time.   Thinks this could have been ongoing since stroke in past.  Checks sugars regularly.  No hypoglycemia.  Checks sugars BID.  Not checking BPs, don't have a cuff.   No incontinence.     Last bone density scan several years ago.   Past Medical History:  Diagnosis Date  . Allergy   . Arthritis   . CAD (coronary artery disease)    mild nonobstructive disease, 30% Cfx lesion  . Cataract   . Diabetes mellitus    insulin-dependent; Dr. Chalmers Cater  . Dyslipidemia   . Hypertension   . Hypothyroidism    Dr. Chalmers Cater  . Stroke [facility])    Current Outpatient Prescriptions on File Prior to Visit  Medication Sig Dispense Refill  . amLODipine (NORVASC) 5 MG tablet TAKE 1 TABLET EVERY DAY 30 tablet 11  . aspirin 81 MG tablet Take 81 mg by mouth daily.     Marland Kitchen atorvastatin (LIPITOR) 80 MG tablet Take 80 mg by mouth daily.      . Coenzyme Q10 (CO Q-10 PO) Take 1 capsule by mouth daily.    . hydrochlorothiazide (HYDRODIURIL) 25 MG tablet Take 25 mg by mouth daily.    Marland Kitchen latanoprost (XALATAN) 0.005 % ophthalmic solution Place 1 drop into both eyes at bedtime.      Marland Kitchen levothyroxine (SYNTHROID, LEVOTHROID) 50 MCG tablet Take 50 mcg by mouth daily before breakfast.    . NOVOLOG MIX 70/30 FLEXPEN (70-30) 100 UNIT/ML FlexPen INJECT 70 UNITS am, 10 units at lunch and 60 units at bedtime.  11  . ramipril (ALTACE) 10 MG capsule TAKE ONE CAPSULE EVERY DAY 30 capsule 0  .  SitaGLIPtin-MetFORMIN HCl (JANUMET XR) 50-500 MG TB24 Take 1 tablet by mouth 2 (two) times daily.    Marland Kitchen loratadine (CLARITIN) 10 MG tablet Take 10 mg by mouth daily as needed for allergies. Reported on 10/09/2015     No current facility-administered medications on file prior to visit.    ROS as in subjective    Objective: Wt 171 lb 3.2 oz (77.7 kg)   SpO2 95%   BMI 28.49 kg/m   Wt Readings from Last 3 Encounters:  11/02/16 171 lb 3.2 oz (77.7 kg)  08/11/16 170 lb (77.1 kg)  07/10/16 171 lb 12.8 oz (77.9 kg)   BP Readings from Last 3 Encounters:  08/11/16 120/70  07/10/16 (!) 142/56  04/22/16 134/70    General appearance: alert, no distress, WD/WN,  Skin: few abrasions along right volar and medial forearm HEENT: normocephalic, sclerae anicteric, PERRLA, EOMi, nares patent, no discharge or erythema, pharynx normal Oral cavity: MMM, no lesions Neck: supple, no lymphadenopathy, no thyromegaly, no masses, no bruits Heart: RRR, normal S1, S2, no murmurs Lungs: CTA bilaterally, no wheezes, rhonchi, or rales Back: non tender Musculoskeletal: nontender, no swelling, no obvious deformity Extremities: no edema, no cyanosis, no clubbing Pulses: 1+ symmetric,  upper and lower extremities, normal cap refill Neurological: alert, oriented x 3, CN2-12 intact, strength normal upper extremities and lower extremities, sensation normal throughout, DTRs 2+ throughout, no cerebellar signs, gait normal Psychiatric: normal affect, behavior normal, pleasant   Diabetic Foot Exam - Simple   Simple Foot Form Diabetic Foot exam was performed with the following findings:  Yes 11/02/2016 10:32 AM  Visual Inspection See comments:  Yes Sensation Testing Intact to touch and monofilament testing bilaterally:  Yes Pulse Check See comments:  Yes Comments 1+ pedal pulses, right small toe with dorsal surgical scar, and due to absence of bone in the, toe abducts almost 80 degrees laterally/everts laterally       Assessment: Encounter Diagnoses  Name Primary?  . Essential hypertension Yes  . Coronary artery disease due to lipid rich plaque   . Dyslipidemia   . Hypothyroidism, unspecified type   . Vaccine counseling   . Dizziness   . Fall, initial encounter   . Estrogen deficiency     Plan: Reviewed labs from 06/2016 per endocrinology  Diabetes, hypothyroidism - managed by endocrinology  HTN, CAD - c/t same medication, reviewed recent cardiology notes  Dyslipidemia - reviewed 06/2016 labs, c/t same medication  Dizziness, ataxia - labs today.   Consider brain MRI  Fall - discussed the recent fall, discussed fall prevention.  Recommended yearly flu shot recommended shingrix vaccine recommended Td  recommended pneumococcal 23 vaccine. She will consider vaccines  She will schedule bone density scan.   Leily was seen today for med check.  Diagnoses and all orders for this visit:  Essential hypertension  Coronary artery disease due to lipid rich plaque  Dyslipidemia  Hypothyroidism, unspecified type -     DG Bone Density; Future  Vaccine counseling  Dizziness  Fall, initial encounter -     DG Bone Density; Future  Estrogen deficiency -     DG Bone Density; Future

## 2016-11-02 NOTE — Patient Instructions (Addendum)
Recommendations:  I recommend you have a shingles vaccine to help prevent shingles or herpes zoster outbreak.      You are due for the Pneumococcal 23 vaccine   I recommend an updated Td, tetanus diptheria vaccine   I recommend a yearly flu shot in the fall   Please call your insurer to inquire about coverage for these vaccines above    Please schedule a bone density scan to check for osteoporosis.     Fall Prevention in the Home Falls can cause injuries and can affect people from all age groups. There are many simple things that you can do to make your home safe and to help prevent falls. What can I do on the outside of my home?  Regularly repair the edges of walkways and driveways and fix any cracks.  Remove high doorway thresholds.  Trim any shrubbery on the main path into your home.  Use bright outdoor lighting.  Clear walkways of debris and clutter, including tools and rocks.  Regularly check that handrails are securely fastened and in good repair. Both sides of any steps should have handrails.  Install guardrails along the edges of any raised decks or porches.  Have leaves, snow, and ice cleared regularly.  Use sand or salt on walkways during winter months.  In the garage, clean up any spills right away, including grease or oil spills. What can I do in the bathroom?  Use night lights.  Install grab bars by the toilet and in the tub and shower. Do not use towel bars as grab bars.  Use non-skid mats or decals on the floor of the tub or shower.  If you need to sit down while you are in the shower, use a plastic, non-slip stool.  Keep the floor dry. Immediately clean up any water that spills on the floor.  Remove soap buildup in the tub or shower on a regular basis.  Attach bath mats securely with double-sided non-slip rug tape.  Remove throw rugs and other tripping hazards from the floor. What can I do in the bedroom?  Use night lights.  Make sure  that a bedside light is easy to reach.  Do not use oversized bedding that drapes onto the floor.  Have a firm chair that has side arms to use for getting dressed.  Remove throw rugs and other tripping hazards from the floor. What can I do in the kitchen?  Clean up any spills right away.  Avoid walking on wet floors.  Place frequently used items in easy-to-reach places.  If you need to reach for something above you, use a sturdy step stool that has a grab bar.  Keep electrical cables out of the way.  Do not use floor polish or wax that makes floors slippery. If you have to use wax, make sure that it is non-skid floor wax.  Remove throw rugs and other tripping hazards from the floor. What can I do in the stairways?  Do not leave any items on the stairs.  Make sure that there are handrails on both sides of the stairs. Fix handrails that are broken or loose. Make sure that handrails are as long as the stairways.  Check any carpeting to make sure that it is firmly attached to the stairs. Fix any carpet that is loose or worn.  Avoid having throw rugs at the top or bottom of stairways, or secure the rugs with carpet tape to prevent them from moving.  Make sure that  you have a light switch at the top of the stairs and the bottom of the stairs. If you do not have them, have them installed. What are some other fall prevention tips?  Wear closed-toe shoes that fit well and support your feet. Wear shoes that have rubber soles or low heels.  When you use a stepladder, make sure that it is completely opened and that the sides are firmly locked. Have someone hold the ladder while you are using it. Do not climb a closed stepladder.  Add color or contrast paint or tape to grab bars and handrails in your home. Place contrasting color strips on the first and last steps.  Use mobility aids as needed, such as canes, walkers, scooters, and crutches.  Turn on lights if it is dark. Replace any  light bulbs that burn out.  Set up furniture so that there are clear paths. Keep the furniture in the same spot.  Fix any uneven floor surfaces.  Choose a carpet design that does not hide the edge of steps of a stairway.  Be aware of any and all pets.  Review your medicines with your healthcare provider. Some medicines can cause dizziness or changes in blood pressure, which increase your risk of falling. Talk with your health care provider about other ways that you can decrease your risk of falls. This may include working with a physical therapist or trainer to improve your strength, balance, and endurance. This information is not intended to replace advice given to you by your health care provider. Make sure you discuss any questions you have with your health care provider. Document Released: 03/06/2002 Document Revised: 08/13/2015 Document Reviewed: 04/20/2014 Elsevier Interactive Patient Education  2017 [employer].

## 2016-11-03 ENCOUNTER — Other Ambulatory Visit: Payer: Self-pay | Admitting: Medical

## 2016-11-03 ENCOUNTER — Other Ambulatory Visit: Payer: Self-pay

## 2016-11-03 DIAGNOSIS — R413 Other amnesia: Secondary | ICD-10-CM

## 2016-11-03 DIAGNOSIS — R270 Ataxia, unspecified: Secondary | ICD-10-CM

## 2016-11-03 LAB — VITAMIN B12: VITAMIN B 12: 617 pg/mL (ref 200–1100)

## 2016-11-03 LAB — VITAMIN D 25 HYDROXY (VIT D DEFICIENCY, FRACTURES): VIT D 25 HYDROXY: 15 ng/mL — AB (ref 30–100)

## 2016-11-03 MED ORDER — VITAMIN D (ERGOCALCIFEROL) 1.25 MG (50000 UNIT) PO CAPS
50000.0000 [IU] | ORAL_CAPSULE | ORAL | 0 refills | Status: DC
Start: 2016-11-03 — End: 2017-01-22

## 2016-11-11 ENCOUNTER — Ambulatory Visit
Admission: RE | Admit: 2016-11-11 | Discharge: 2016-11-11 | Disposition: A | Discharge status: Home / Self Care | Payer: PRIVATE HEALTH INSURANCE | Source: Ambulatory Visit | Attending: Medical | Admitting: Medical

## 2016-11-11 DIAGNOSIS — E039 Hypothyroidism, unspecified: Secondary | ICD-10-CM

## 2016-11-11 DIAGNOSIS — E2839 Other primary ovarian failure: Secondary | ICD-10-CM

## 2016-11-11 DIAGNOSIS — W19XXXA Unspecified fall, initial encounter: Secondary | ICD-10-CM

## 2016-12-07 ENCOUNTER — Ambulatory Visit
Admission: RE | Admit: 2016-12-07 | Discharge: 2016-12-07 | Disposition: A | Discharge status: Home / Self Care | Payer: PRIVATE HEALTH INSURANCE | Source: Ambulatory Visit | Attending: Medical | Admitting: Medical

## 2016-12-07 DIAGNOSIS — Z1231 Encounter for screening mammogram for malignant neoplasm of breast: Secondary | ICD-10-CM

## 2016-12-22 ENCOUNTER — Telehealth: Payer: Self-pay

## 2016-12-22 NOTE — Telephone Encounter (Signed)
I called pt again to discuss. No answer. Already left a VM.

## 2016-12-22 NOTE — Telephone Encounter (Signed)
I called pt, per Dr. Jaynee Eagles request, to see if pt would be amendable to seeing Dr. Leta Baptist on 12/25/2016 at 8:00am, instead of Dr. Jaynee Eagles on 12/24/16 at 1:30pm. No answer, left a message asking her to call me back.

## 2016-12-24 ENCOUNTER — Encounter: Payer: Self-pay | Admitting: Neurology

## 2016-12-24 ENCOUNTER — Ambulatory Visit (INDEPENDENT_AMBULATORY_CARE_PROVIDER_SITE_OTHER): Payer: PRIVATE HEALTH INSURANCE | Admitting: Neurology

## 2016-12-24 VITALS — BP 146/62 | HR 62 | Ht 65.0 in | Wt 170.6 lb

## 2016-12-24 DIAGNOSIS — R270 Ataxia, unspecified: Secondary | ICD-10-CM | POA: Diagnosis not present

## 2016-12-24 DIAGNOSIS — H9190 Unspecified hearing loss, unspecified ear: Secondary | ICD-10-CM

## 2016-12-24 DIAGNOSIS — H938X3 Other specified disorders of ear, bilateral: Secondary | ICD-10-CM | POA: Diagnosis not present

## 2016-12-24 DIAGNOSIS — G3281 Cerebellar ataxia in diseases classified elsewhere: Secondary | ICD-10-CM

## 2016-12-24 DIAGNOSIS — R42 Dizziness and giddiness: Secondary | ICD-10-CM

## 2016-12-24 DIAGNOSIS — H905 Unspecified sensorineural hearing loss: Secondary | ICD-10-CM | POA: Diagnosis not present

## 2016-12-24 DIAGNOSIS — J328 Other chronic sinusitis: Secondary | ICD-10-CM

## 2016-12-24 DIAGNOSIS — W19XXXA Unspecified fall, initial encounter: Secondary | ICD-10-CM

## 2016-12-24 NOTE — Progress Notes (Signed)
[facility]    Provider:  Dr Jaynee Eagles Referring Provider: Carlena Hurl, PA-C Primary Care Physician:  Carlena Hurl, PA-C  CC:  Dizziness  HPI:  Shelly Blevins is a 71 y.o. female here as a referral from Dr. Glade Lloyd. She reports her concern is dizziness, she fell a few times. Been ongoing for a few months.  She had a "mini stroke" 10 years ago.  The dizziness is like her head is moving, her ears get plugged up, she can hearing in the ears and "waves", she has chronic sinus problems which may be associated or the cause. When she is walking she get dizzy and imbalanced with vertigo, brief, if she gets up too quick it happens. Better with sitting. She drinks enough water. "spells" are brief, a few seconds, when standing too quick or walking, no LOC, no headaches, no vision changes, mild resolves quickly. No other focal neurologic deficits, associated symptoms, inciting events or modifiable factors.  Reviewed notes, labs and imaging from outside physicians, which showed:  CT head 05/2006 :   Clinical Data: 71 year old female, headache, syncope.   HEAD CT WITHOUT CONTRAST:  Technique: Contiguous axial images were obtained from the base of the skull through the vertex according to standard protocol without contrast.  Comparison: None available.   Findings: There is no evidence of intracranial hemorrhage, brain edema, acute infarct, mass lesion, or mass effect.  No other intra-axial abnormalities are seen, and the ventricles are within normal limits.  No abnormal extra-axial fluid collections or masses are identified.  No skull abnormalities are noted.  IMPRESSION:  Negative non-contrast head CT.   B12 and tsh WNL, cbc normal, bmp with elevated glucose (patient reports last hgba1c 7)  Review of Systems: Patient complains of symptoms per HPI as well as the following symptoms: dizziness. Pertinent negatives and positives per HPI. All others negative.   Social History     Social History  . Marital status: Married    Spouse name: N/A  . Number of children: 2  . Years of education: 10   Occupational History  .  [employer] History Main Topics  . Smoking status: Former Smoker    Quit date: 04/02/1986  . Smokeless tobacco: Never Used  . Alcohol use No  . Drug use: No  . Sexual activity: Not on file   Other Topics Concern  . Not on file   Social History Narrative  . No narrative on file    Family History  Problem Relation Age of Onset  . Heart failure Mother   . Diabetes Mother   . Sudden death Sister        died at 53  . Heart failure Brother        died at 19    Past Medical History:  Diagnosis Date  . Allergy   . Arthritis   . CAD (coronary artery disease)    mild nonobstructive disease, 30% Cfx lesion  . Cataract   . Diabetes mellitus    insulin-dependent; Dr. Chalmers Cater  . Dyslipidemia   . Hypertension   . Hypothyroidism    Dr. Chalmers Cater  . Stroke [facility])     Past Surgical History:  Procedure Laterality Date  . ABDOMINAL HYSTERECTOMY    . CARDIAC CATHETERIZATION  02/17/2007   mild CAD with 50% prox L Cfx stenosis, normal LV function (Dr. Norlene Duel)  . CHOLECYSTECTOMY    . EYE SURGERY    . NM MYOCAR PERF  WALL MOTION  2009   lexiscan myoview - perfusion defect in anterior myociaral region (breast attenuation) - low risk scan - EF 69%  . TRANSTHORACIC ECHOCARDIOGRAM  2008   normal study     Current Outpatient Prescriptions  Medication Sig Dispense Refill  . amLODipine (NORVASC) 5 MG tablet TAKE 1 TABLET EVERY DAY 30 tablet 11  . aspirin 81 MG tablet Take 81 mg by mouth daily.     Marland Kitchen atorvastatin (LIPITOR) 80 MG tablet Take 80 mg by mouth daily.      . Coenzyme Q10 (CO Q-10 PO) Take 1 capsule by mouth daily.    . hydrochlorothiazide (HYDRODIURIL) 25 MG tablet Take 25 mg by mouth daily.    Marland Kitchen levothyroxine (SYNTHROID, LEVOTHROID) 50 MCG tablet Take 50 mcg by mouth daily before breakfast.    . loratadine  (CLARITIN) 10 MG tablet Take 10 mg by mouth daily as needed for allergies. Reported on 10/09/2015    . ramipril (ALTACE) 10 MG capsule TAKE ONE CAPSULE EVERY DAY 30 capsule 0  . SitaGLIPtin-MetFORMIN HCl (JANUMET XR) 50-500 MG TB24 Take 1 tablet by mouth 2 (two) times daily.    . Vitamin D, Ergocalciferol, (DRISDOL) 50000 units CAPS capsule Take 1 capsule (50,000 Units total) by mouth every 7 (seven) days. 12 capsule 0   No current facility-administered medications for this visit.     Allergies as of 12/24/2016  . (No Known Allergies)    Vitals: BP (!) 146/62   Pulse 62   Ht 5\' 5"  (1.651 m)   Wt 170 lb 9.6 oz (77.4 kg)   BMI 28.39 kg/m  Last Weight:  Wt Readings from Last 1 Encounters:  12/24/16 170 lb 9.6 oz (77.4 kg)   Last Height:   Ht Readings from Last 1 Encounters:  12/24/16 5\' 5"  (1.651 m)    Physical exam: Exam: Gen: NAD, conversant, well nourised, obese, well groomed                     CV: RRR, no MRG. No Carotid Bruits. No peripheral edema, warm, nontender Eyes: Conjunctivae clear without exudates or hemorrhage  Neuro: Detailed Neurologic Exam  Speech:    Speech is normal; fluent and spontaneous with normal comprehension.  Cognition:    The patient is oriented to person, place, and time;     recent and remote memory impaired;     language fluent;     normal attention, concentration,    fund of knowledge impaired Cranial Nerves:    The pupils are equal, round, and reactive to light. Attempted fundpscopic exam could not visualize. . Visual fields are full to finger confrontation. Extraocular movements are intact. Trigeminal sensation is intact and the muscles of mastication are normal. The face is symmetric. The palate elevates in the midline. Hearing intact. Voice is normal. Shoulder shrug is normal. The tongue has normal motion without fasciculations.   Coordination:    No dysmetria  Gait:    Imbalance with Heel-toe and tandem gait   Motor  Observation:    No asymmetry, no atrophy, and no involuntary movements noted. Tone:    Normal muscle tone.    Posture:    Posture is normal. normal erect    Strength:    Strength is V/V in the upper and lower limbs.      Sensation: intact to LT     Reflex Exam:  DTR's:    Absent AJs, trace patellars, biceps WNL .   Toes:  The toes are downgoing bilaterally.   Clonus:    Clonus is absent.      Assessment/Plan:  Patient with episodes of vertigo, chronic sinus disease, chronic ear fullness or feelings of water in the ears. Vertigo is brief, happens when standing too quickly or sometimes when walking.  - May be orthostatic, f/u with pcp or cardiology for symptoms possibly due to medication such as blood pressure medications. Not orthostatic in clinic but BP did drop from 270 to 623 systolic from laying to standing - Discussed precautions and treatments below - Ear fullness, vertigo, sinus congestion: Refer to ENT Dr. Ernesto Rutherford - MRI of the brain to ensure no other etiologies like masses, lesions, strokes, schwannomas.  - Fall precautions - follow up 10 weeks  Orders Placed This Encounter  Procedures  . MR BRAIN W WO CONTRAST  . Ambulatory referral to ENT     Non-Drug Treatment for Low Blood Pressure on Standing:  1. Changing Postures: . Change posture slowly when getting up, especially in the morning . Hold on to something during the first few minutes after standing up. Do not start walking as soon as you get up from the chair . Avoid prolonged recumbency or lying down . Raise the head of the bed by 10 to 20 degrees  2. Exercise: . Perform Isotonic exercise, e.g.recumbent bike, pedaling movements while sitting in a chair . Avoid exercises where you have to strain   3. Avoid Pooling of blood in legs: . Wear custom-fitted elastic stockings. The ones which extend to the abdomen work even better. Consider wearing an abdominal binder. . Perform physical  counter-maneuvers, such as crossing legs and tensing leg muscles.  4. Eating and Drinking: . Small meals are recommended. Avoid large meals. Avoid standing suddenly after a large meal . Avoid alcohol . Increase intake of fluids and regular salt. A daily intake of up to 10 grams of sodium per day and a fluid intake of 2.0 to 2.5 liters per day (8 to 10 glasses of water) is recommended.  . Rapid (over 3 minutes) ingestion of approximately 0.5 liter (2 glasses of water) of tap water, raises blood pressure within 5 to 15 minutes and lasts for an hour.  5. Other Tips: . Avoid hot baths. Instead take warm baths . Maintain a BP record standing and lying down . If you are only any BP lowering drugs (antihypertensives, diuretics, antidepressants, drugs for prostate, etc) ask your doctor to revisit the need to keep you on these drugs . If all non-drug therapy fails, ask your doctor about drug therapy   Orders Placed This Encounter  Procedures  . MR BRAIN W WO CONTRAST  . Ambulatory referral to ENT     Sarina Ill, MD  [facility] [address] [place], [place] [address]  Phone [phone] Fax [phone]

## 2016-12-24 NOTE — Telephone Encounter (Signed)
I called pt again to ask if she would like to r/s her appt with Dr. Jaynee Eagles to see Dr. Leta Baptist on Friday. Again, no answer, left a VM asking her to call me back.

## 2016-12-24 NOTE — Patient Instructions (Addendum)
- Ear,nose,throat doctor Minna Merritts - MRI brain  - follow up with is in 10-12 weeks  It is important to avoid accidents which may result in broken bones.  Here are a few ideas on how to make your home safer so you will be less likely to trip or fall.  1. Use nonskid mats or non slip strips in your shower or tub, on your bathroom floor and around sinks.  If you know that you have spilled water, wipe it up! 2. In the bathroom, it is important to have properly installed grab bars on the walls or on the edge of the tub.  Towel racks are NOT strong enough for you to hold onto or to pull on for support. 3. Stairs and hallways should have enough light.  Add lamps or night lights if you need ore light. 4. It is good to have handrails on both sides of the stairs if possible.  Always fix broken handrails right away. 5. It is important to see the edges of steps.  Paint the edges of outdoor steps white so you can see them better.  Put colored tape on the edge of inside steps. 6. Throw-rugs are dangerous because they can slide.  Removing the rugs is the best idea, but if they must stay, add adhesive carpet tape to prevent slipping. 7. Do not keep things on stairs or in the halls.  Remove small furniture that blocks the halls as it may cause you to trip.  Keep telephone and electrical cords out of the way where you walk. 8. Always were sturdy, rubber-soled shoes for good support.  Never wear just socks, especially on the stairs.  Socks may cause you to slip or fall.  Do not wear full-length housecoats as you can easily trip on the bottom.  9. Place the things you use the most on the shelves that are the easiest to reach.  If you use a stepstool, make sure it is in good condition.  If you feel unsteady, DO NOT climb, ask for help. 10. If a health professional advises you to use a cane or walker, do not be ashamed.  These items can keep you from falling and breaking your bones.  Non-Drug Treatment for Low Blood  Pressure on Standing:  1. Changing Postures: . Change posture slowly when getting up, especially in the morning . Hold on to something during the first few minutes after standing up. Do not start walking as soon as you get up from the chair . Avoid prolonged recumbency or lying down . Raise the head of the bed by 10 to 20 degrees  2. Exercise: . Perform Isotonic exercise, e.g.recumbent bike, pedaling movements while sitting in a chair . Avoid exercises where you have to strain   3. Avoid Pooling of blood in legs: . Wear custom-fitted elastic stockings. The ones which extend to the abdomen work even better. Consider wearing an abdominal binder. . Perform physical counter-maneuvers, such as crossing legs and tensing leg muscles.  4. Eating and Drinking: . Small meals are recommended. Avoid large meals. Avoid standing suddenly after a large meal . Avoid alcohol . Increase intake of fluids and regular salt. A daily intake of up to 10 grams of sodium per day and a fluid intake of 2.0 to 2.5 liters per day (8 to 10 glasses of water) is recommended.  . Rapid (over 3 minutes) ingestion of approximately 0.5 liter (2 glasses of water) of tap water, raises blood pressure within  5 to 15 minutes and lasts for an hour.  5. Other Tips: . Avoid hot baths. Instead take warm baths . Maintain a BP record standing and lying down . If you are only any BP lowering drugs (antihypertensives, diuretics, antidepressants, drugs for prostate, etc) ask your doctor to revisit the need to keep you on these drugs . If all non-drug therapy fails, ask your doctor about drug therapy

## 2017-01-19 ENCOUNTER — Other Ambulatory Visit: Payer: Self-pay | Admitting: Medical

## 2017-01-20 ENCOUNTER — Ambulatory Visit
Admission: RE | Admit: 2017-01-20 | Discharge: 2017-01-20 | Disposition: A | Discharge status: Home / Self Care | Payer: PRIVATE HEALTH INSURANCE | Source: Ambulatory Visit | Attending: Neurology | Admitting: Neurology

## 2017-01-20 DIAGNOSIS — G3281 Cerebellar ataxia in diseases classified elsewhere: Secondary | ICD-10-CM | POA: Diagnosis not present

## 2017-01-20 DIAGNOSIS — J328 Other chronic sinusitis: Secondary | ICD-10-CM

## 2017-01-20 DIAGNOSIS — R42 Dizziness and giddiness: Secondary | ICD-10-CM

## 2017-01-20 DIAGNOSIS — W19XXXA Unspecified fall, initial encounter: Secondary | ICD-10-CM

## 2017-01-20 DIAGNOSIS — H938X3 Other specified disorders of ear, bilateral: Secondary | ICD-10-CM

## 2017-01-20 DIAGNOSIS — H9190 Unspecified hearing loss, unspecified ear: Secondary | ICD-10-CM

## 2017-01-20 DIAGNOSIS — R270 Ataxia, unspecified: Secondary | ICD-10-CM

## 2017-01-20 MED ORDER — GADOBENATE DIMEGLUMINE 529 MG/ML IV SOLN
15.0000 mL | Freq: Once | INTRAVENOUS | Status: AC | PRN
  Administered 2017-01-20: 15 mL via INTRAVENOUS

## 2017-01-22 ENCOUNTER — Telehealth: Payer: Self-pay

## 2017-01-22 ENCOUNTER — Other Ambulatory Visit: Payer: Self-pay | Admitting: Medical

## 2017-01-22 NOTE — Telephone Encounter (Signed)
I spoke with patient and she is aware of MRI results. She voiced understanding and appreciation.

## 2017-01-22 NOTE — Telephone Encounter (Signed)
-----   Message from Melvenia Beam, MD sent at 01/21/2017  6:37 PM EDT ----- MRI brain unremarkable

## 2017-01-27 ENCOUNTER — Other Ambulatory Visit: Payer: Self-pay | Admitting: Medical

## 2017-03-03 NOTE — Progress Notes (Signed)
[facility]  PATIENT: Shelly Blevins DOB: [birth year]   REASON FOR VISIT: Follow-up for dizziness HISTORY FROM: Patient    HISTORY OF PRESENT ILLNESS:UPDATE 12/6/2018CM Shelly Blevins, 71 year old female returns for follow-up with a history of dizziness.  She had fallen a couple times prior to being seen by Dr. Jaynee Eagles.  She also has a history of chronic sinus problems.  She has dizziness when getting up from a seated or lying position too quickly.  MRI of the brain performed 01/20/2017 was unremarkable.  She had a ENT referral referral with Dr. Ernesto Rutherford, patient claims that no reason for her dizziness was found.  We do not have any information from the office.  Her dizziness is somewhat better and she thinks is coming from a medication side effect.  She has not had any further falls.  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  She returns for reevaluation 12/24/16 AADorothy A Blevins is a 71 y.o. female here as a referral from Dr. Glade Lloyd. She reports her concern is dizziness, she fell a few times. Been ongoing for a few months.  She had a "mini stroke" 10 years ago.  The dizziness is like her head is moving, her ears get plugged up, she can hearing in the ears and "waves", she has chronic sinus problems which may be associated or the cause. When she is walking she get dizzy and imbalanced with vertigo, brief, if she gets up too quick it happens. Better with sitting. She drinks enough water. "spells" are brief, a few seconds, when standing too quick or walking, no LOC, no headaches, no vision changes, mild resolves quickly. No other focal neurologic deficits, associated symptoms, inciting events or modifiable factors.  REVIEW OF SYSTEMS: Full 14 system review of systems performed and notable only for those listed, all others are neg:  Constitutional: neg  Cardiovascular: neg Ear/Nose/Throat: neg  Skin: neg Eyes: neg Respiratory: neg Gastroitestinal:  neg  Hematology/Lymphatic: neg  Endocrine: neg Musculoskeletal:neg Allergy/Immunology: neg Neurological: neg Psychiatric: neg Sleep : neg   ALLERGIES: No Known Allergies  HOME MEDICATIONS: Outpatient Medications Prior to Visit  Medication Sig Dispense Refill  . amLODipine (NORVASC) 5 MG tablet TAKE 1 TABLET EVERY DAY 30 tablet 8  . aspirin 81 MG tablet Take 81 mg by mouth daily.     Marland Kitchen atorvastatin (LIPITOR) 80 MG tablet Take 80 mg by mouth daily.      . Coenzyme Q10 (CO Q-10 PO) Take 1 capsule by mouth daily.    . hydrochlorothiazide (HYDRODIURIL) 25 MG tablet Take 25 mg by mouth daily.    Marland Kitchen levothyroxine (SYNTHROID, LEVOTHROID) 50 MCG tablet Take 50 mcg by mouth daily before breakfast.    . loratadine (CLARITIN) 10 MG tablet Take 10 mg by mouth daily as needed for allergies. Reported on 10/09/2015    . ramipril (ALTACE) 10 MG capsule TAKE ONE CAPSULE EVERY DAY 90 capsule 0  . SitaGLIPtin-MetFORMIN HCl (JANUMET XR) 50-500 MG TB24 Take 1 tablet by mouth 2 (two) times daily.    . Vitamin D, Ergocalciferol, (DRISDOL) 50000 units CAPS capsule TAKE 1 CAPSULE (50,000 UNITS TOTAL) BY MOUTH EVERY 7 (SEVEN) DAYS. 12 capsule 0   No facility-administered medications prior to visit.     PAST MEDICAL HISTORY: Past Medical History:  Diagnosis Date  . Allergy   . Arthritis   . CAD (coronary artery disease)    mild nonobstructive disease, 30% Cfx lesion  . Cataract   .  Diabetes mellitus    insulin-dependent; Dr. Chalmers Cater  . Dyslipidemia   . Hypertension   . Hypothyroidism    Dr. Chalmers Cater  . Stroke [facility])     PAST SURGICAL HISTORY: Past Surgical History:  Procedure Laterality Date  . ABDOMINAL HYSTERECTOMY    . CARDIAC CATHETERIZATION  02/17/2007   mild CAD with 50% prox L Cfx stenosis, normal LV function (Dr. Norlene Duel)  . CHOLECYSTECTOMY    . EYE SURGERY    . NM MYOCAR PERF WALL MOTION  2009   lexiscan myoview - perfusion defect in anterior myociaral region (breast attenuation) -  low risk scan - EF 69%  . TRANSTHORACIC ECHOCARDIOGRAM  2008   normal study     FAMILY HISTORY: Family History  Problem Relation Age of Onset  . Heart failure Mother   . Diabetes Mother   . Sudden death Sister        died at 78  . Heart failure Brother        died at 43    SOCIAL HISTORY: Social History   Socioeconomic History  . Marital status: Married    Spouse name: Not on file  . Number of children: 2  . Years of education: 10  . Highest education level: Not on file  Social Needs  . Financial resource strain: Not on file  . Food insecurity - worry: Not on file  . Food insecurity - inability: Not on file  . Transportation needs - medical: Not on file  . Transportation needs - non-medical: Not on file  Occupational History    Employer: [employer]  Tobacco Use  . Smoking status: Former Smoker    Last attempt to quit: 04/02/1986    Years since quitting: 30.9  . Smokeless tobacco: Never Used  Substance and Sexual Activity  . Alcohol use: No  . Drug use: No  . Sexual activity: Not on file  Other Topics Concern  . Not on file  Social History Narrative  . Not on file     PHYSICAL EXAM  Vitals:   03/04/17 1255  BP: (!) 163/61  Pulse: (!) 58  Weight: 167 lb (75.8 kg)  Height: 5\' 5"  (1.651 m)   Body mass index is 27.79 kg/m.  Generalized: Well developed, in no acute distress  Head: normocephalic and atraumatic,. Oropharynx benign  Neck: Supple, no carotid bruits  Cardiac: Regular rate rhythm, no murmur  Musculoskeletal: No deformity   Neurological examination   Mentation: Alert oriented to time, place, history taking. Attention span and concentration appropriate. Recent and remote memory intact.  Follows all commands speech and language fluent.   Cranial nerve II-XII: Pupils were equal round reactive to light extraocular movements were full, visual field were full on confrontational test.  No nystagmus.  Facial sensation and strength were normal.  hearing was intact to finger rubbing bilaterally. Uvula tongue midline. head turning and shoulder shrug were normal and symmetric.Tongue protrusion into cheek strength was normal. Motor: normal bulk and tone, full strength in the BUE, BLE, fine finger movements normal, no pronator drift. No focal weakness Sensory: normal and symmetric to light touch,  Coordination: finger-nose-finger, heel-to-shin bilaterally, no dysmetria Reflexes: Brachioradialis 2/2, biceps 2/2, triceps 2/2, patellar trace, Achilles absent, plantar responses were flexor bilaterally. Gait and Station: Rising up from seated position without assistance, normal stance,  moderate stride, good arm swing, smooth turning, able to perform tiptoe, and heel walking without difficulty. Tandem gait is mildly unsteady  DIAGNOSTIC DATA (LABS, IMAGING,  TESTING) - I reviewed patient records, labs, notes, testing and imaging myself where available.  Lab Results  Component Value Date   WBC 6.5 11/02/2016   HGB 12.0 11/02/2016   HCT 36.5 11/02/2016   MCV 88.0 11/02/2016   PLT 236 11/02/2016      Component Value Date/Time   NA 139 11/02/2016 0934   K 4.5 11/02/2016 0934   CL 99 11/02/2016 0934   CO2 29 11/02/2016 0934   GLUCOSE 141 (H) 11/02/2016 0934   BUN 14 11/02/2016 0934   CREATININE 0.90 11/02/2016 0934   CALCIUM 9.8 11/02/2016 0934    Lab Results  Component Value Date   HUDJSHFW26 378 11/02/2016   Lab Results  Component Value Date   TSH 1.52 11/02/2016      ASSESSMENT AND PLAN Patient with episodes of vertigo, chronic sinus disease, chronic ear fullness or feelings of water in the ears. Vertigo is brief, happens when standing too quickly or sometimes when walking.  MRI of the brain was normal.  No further falls ENT exam was negative according to patient    PLAN: Non-Drug Treatment for Low Blood Pressure on Standing:  Changing Postures: . Change posture slowly when getting up, especially in the morning . Hold on  to something during the first few minutes after standing up. Do not start walking as soon as you get up  Be careful with ambulation No follow-up planned Dennie Bible, [facility], [facility], [facility] [address], [place], [place] [address] [phone]

## 2017-03-04 ENCOUNTER — Encounter: Payer: Self-pay | Admitting: Nurse Practitioner

## 2017-03-04 ENCOUNTER — Encounter (INDEPENDENT_AMBULATORY_CARE_PROVIDER_SITE_OTHER): Payer: Self-pay

## 2017-03-04 ENCOUNTER — Ambulatory Visit (INDEPENDENT_AMBULATORY_CARE_PROVIDER_SITE_OTHER): Payer: PRIVATE HEALTH INSURANCE | Admitting: Nurse Practitioner

## 2017-03-04 VITALS — BP 163/61 | HR 58 | Ht 65.0 in | Wt 167.0 lb

## 2017-03-04 DIAGNOSIS — I10 Essential (primary) hypertension: Secondary | ICD-10-CM

## 2017-03-04 DIAGNOSIS — R42 Dizziness and giddiness: Secondary | ICD-10-CM | POA: Diagnosis not present

## 2017-03-04 NOTE — Progress Notes (Signed)
Personally  participated in, made any corrections needed, and agree with history, physical, neuro exam,assessment and plan as stated above.    Antonia Ahern, MD [facility] 

## 2017-03-04 NOTE — Patient Instructions (Signed)
MRI of the brain was normal Non-Drug Treatment for Low Blood Pressure on Standing:  Changing Postures: . Change posture slowly when getting up, especially in the morning . Hold on to something during the first few minutes after standing up. Do not start walking as soon as you get up  Be careful with ambulation No follow-up planned

## 2017-03-19 ENCOUNTER — Other Ambulatory Visit: Payer: Self-pay | Admitting: Medical

## 2017-03-19 ENCOUNTER — Ambulatory Visit: Payer: PRIVATE HEALTH INSURANCE | Admitting: Medical

## 2017-03-19 ENCOUNTER — Encounter: Payer: Self-pay | Admitting: Medical

## 2017-03-19 ENCOUNTER — Ambulatory Visit
Admission: RE | Admit: 2017-03-19 | Discharge: 2017-03-19 | Disposition: A | Discharge status: Home / Self Care | Payer: PRIVATE HEALTH INSURANCE | Source: Ambulatory Visit | Attending: Medical | Admitting: Medical

## 2017-03-19 VITALS — BP 130/64 | HR 89 | Temp 100.1°F | Wt 163.8 lb

## 2017-03-19 DIAGNOSIS — R05 Cough: Secondary | ICD-10-CM | POA: Diagnosis not present

## 2017-03-19 DIAGNOSIS — R918 Other nonspecific abnormal finding of lung field: Secondary | ICD-10-CM | POA: Diagnosis not present

## 2017-03-19 DIAGNOSIS — R0602 Shortness of breath: Secondary | ICD-10-CM

## 2017-03-19 DIAGNOSIS — R42 Dizziness and giddiness: Secondary | ICD-10-CM | POA: Diagnosis not present

## 2017-03-19 DIAGNOSIS — R059 Cough, unspecified: Secondary | ICD-10-CM

## 2017-03-19 LAB — CBC WITH DIFFERENTIAL/PLATELET
Basophils Absolute: 24 cells/uL (ref 0–200)
Basophils Relative: 0.3 %
Eosinophils Absolute: 87 cells/uL (ref 15–500)
Eosinophils Relative: 1.1 %
HCT: 32.7 % — ABNORMAL LOW (ref 35.0–45.0)
Hemoglobin: 11.3 g/dL — ABNORMAL LOW (ref 11.7–15.5)
Lymphs Abs: 1849 cells/uL (ref 850–3900)
MCH: 29.2 pg (ref 27.0–33.0)
MCHC: 34.6 g/dL (ref 32.0–36.0)
MCV: 84.5 fL (ref 80.0–100.0)
MONOS PCT: 9.8 %
MPV: 11.6 fL (ref 7.5–12.5)
Neutro Abs: 5167 cells/uL (ref 1500–7800)
Neutrophils Relative %: 65.4 %
PLATELETS: 181 10*3/uL (ref 140–400)
RBC: 3.87 10*6/uL (ref 3.80–5.10)
RDW: 13.5 % (ref 11.0–15.0)
TOTAL LYMPHOCYTE: 23.4 %
WBC mixed population: 774 cells/uL (ref 200–950)
WBC: 7.9 10*3/uL (ref 3.8–10.8)

## 2017-03-19 MED ORDER — CLARITHROMYCIN 500 MG PO TABS: 500.0000 mg | ORAL_TABLET | Freq: Two times a day (BID) | ORAL | 0 refills | Status: DC

## 2017-03-19 NOTE — Progress Notes (Signed)
Subjective Chief Complaint  Patient presents with  . coughing congestion    coughing, congestion, runny nose, fever, icthy throat, fever x 1week    Here for 1 week hx/o worsening head and chest congestion.  She reports symptoms including head clogged up, scratchy throat, cough, low grade fever, sweats last night, some chills, body aches, some purulent nasal discharge, productive cough.  Using Claritin and mucinex.   Feels some discomfort and wheezing in chest.  No NVD.   No ear pain.  No sick contacts.  Feels little weak in general  Since last visit we had referred her to neurology and ear nose and throat for ongoing dizziness.  She had a pretty comprehensive evaluation but she says no specific cause was found.   Past Medical History:  Diagnosis Date  . Allergy   . Arthritis   . CAD (coronary artery disease)    mild nonobstructive disease, 30% Cfx lesion  . Cataract   . Diabetes mellitus    insulin-dependent; Dr. Chalmers Cater  . Dyslipidemia   . Hypertension   . Hypothyroidism    Dr. Chalmers Cater  . Stroke [facility])     Current Outpatient Medications on File Prior to Visit  Medication Sig Dispense Refill  . amLODipine (NORVASC) 5 MG tablet TAKE 1 TABLET EVERY DAY 30 tablet 8  . aspirin 81 MG tablet Take 81 mg by mouth daily.     Marland Kitchen atorvastatin (LIPITOR) 80 MG tablet Take 80 mg by mouth daily.      . Coenzyme Q10 (CO Q-10 PO) Take 1 capsule by mouth daily.    . hydrochlorothiazide (HYDRODIURIL) 25 MG tablet Take 25 mg by mouth daily.    Marland Kitchen levothyroxine (SYNTHROID, LEVOTHROID) 50 MCG tablet Take 50 mcg by mouth daily before breakfast.    . loratadine (CLARITIN) 10 MG tablet Take 10 mg by mouth daily as needed for allergies. Reported on 10/09/2015    . ramipril (ALTACE) 10 MG capsule TAKE ONE CAPSULE EVERY DAY 90 capsule 0  . SitaGLIPtin-MetFORMIN HCl (JANUMET XR) 50-500 MG TB24 Take 1 tablet by mouth 2 (two) times daily.    . Vitamin D, Ergocalciferol, (DRISDOL) 50000 units CAPS capsule TAKE 1 CAPSULE  (50,000 UNITS TOTAL) BY MOUTH EVERY 7 (SEVEN) DAYS. 12 capsule 0   No current facility-administered medications on file prior to visit.    ROS as in subjective   Objective: BP 130/64   Pulse 89   Temp 100.1 F (37.8 C)   Wt 163 lb 12.8 oz (74.3 kg)   SpO2 96%   BMI 27.26 kg/m   Wt Readings from Last 3 Encounters:  03/19/17 163 lb 12.8 oz (74.3 kg)  03/04/17 167 lb (75.8 kg)  12/24/16 170 lb 9.6 oz (77.4 kg)   BP Readings from Last 3 Encounters:  03/19/17 130/64  03/04/17 (!) 163/61  12/24/16 (!) 146/62   General appearance: alert, no distress, WD/WN, ill appearing HEENT: normocephalic, sclerae anicteric, TMs pearly, nares patent, no discharge , mild erythema, pharynx normal Oral cavity: MMM, no lesions Neck: supple, no lymphadenopathy, no thyromegaly, no masses Heart: RRR, normal S1, S2, no murmurs Lungs: decreased breath sounds in right side of chest in general, no wheezes, + rhonchi, + right sided rales Abdomen: +bs, soft, non tender, non distended, no masses, no hepatomegaly, no splenomegaly Pulses: 1+ symmetric, upper and lower extremities, normal cap refill Ext:no edema    Assessment: Encounter Diagnoses  Name Primary?  . Cough Yes  . Abnormal lung field   .  SOB (shortness of breath)   . Dizziness      Plan: She has reduced lung sounds on the right today, suspicious for pneumonia.  I will send for x-ray, stat labs today.  We discussed supportive care, rest, hydration, and we will call with results.  Advised if much worse over the weekend to go to the emergency department  I reviewed her recent visit to neurology regarding dizziness.  Their recommendations and plan suggested a combination of vertigo, chronic sinus disease, chronic ear fullness with recommendations to change posture slowly hold onto things the first few minutes after standing up, being careful in ambulation, but no specific medication changes or medicine recommendations.  She also had an ENT  evaluation with Dr. Ernesto Rutherford for the same.  Brain MRI on 01/20/17 was unremarkable.  Gave the following recommendations: Patient Instructions  Given your lung sounds and illness I want you to go for a chest x-ray   Rest, drink plenty of fluids  You can continue Mucinex DM  We will call in a few hours with your x-ray and blood count results  Over the weekend if you feel like you are getting dehydrated, feeling really thirsty or not urinating very much, then temporarily stop the ramipril and Janumet until you are much improved.  If over the weekend if you are away worse, unable to keep anything down, not drinking very much then consider going to the emergency department   Myron was seen today for coughing congestion.  Diagnoses and all orders for this visit:  Cough -     CBC with Differential/Platelet -     DG Chest 2 View; Future  Abnormal lung field -     CBC with Differential/Platelet -     DG Chest 2 View; Future  SOB (shortness of breath) -     CBC with Differential/Platelet -     DG Chest 2 View; Future  Dizziness

## 2017-03-19 NOTE — Patient Instructions (Addendum)
Given your lung sounds and illness I want you to go for a chest x-ray   Rest, drink plenty of fluids  You can continue Mucinex DM  We will call in a few hours with your x-ray and blood count results  Over the weekend if you feel like you are getting dehydrated, feeling really thirsty or not urinating very much, then temporarily stop the ramipril and Janumet until you are much improved.  If over the weekend if you are away worse, unable to keep anything down, not drinking very much then consider going to the emergency department

## 2017-03-26 ENCOUNTER — Encounter: Payer: Self-pay | Admitting: Medical

## 2017-03-26 ENCOUNTER — Ambulatory Visit: Payer: PRIVATE HEALTH INSURANCE | Admitting: Medical

## 2017-03-26 VITALS — BP 132/60 | HR 84 | Resp 18 | Wt 166.6 lb

## 2017-03-26 DIAGNOSIS — R42 Dizziness and giddiness: Secondary | ICD-10-CM

## 2017-03-26 DIAGNOSIS — J189 Pneumonia, unspecified organism: Secondary | ICD-10-CM

## 2017-03-26 NOTE — Progress Notes (Signed)
Subjective:  Chief Complaint  Patient presents with  . Follow-up    1 week f/u  cough   Here for one-week follow-up from pneumonia.  She notes that she feels about 80% improved, hydrating well, no fever, no chills no body aches.  She is coughing some still but this is improved.  She has 3 more days left of the antibiotic.  She is rating her back to work.  She did stop the 3 medicines we asked her to stop temporarily, ramipril, Janumet, atorvastatin.  Since she has been off those 3 medicines the dizziness has resolved.  She has been having several month history of dizziness has had multiple evaluations for this.  She thinks is related to the [place].  She denies any recent low blood pressure or low sugar readings.  Her blood sugar was running around 150 since stopping the medication.  No other aggravating or relieving factors.  No other complaint.  Past Medical History:  Diagnosis Date  . Allergy   . Arthritis   . CAD (coronary artery disease)    mild nonobstructive disease, 30% Cfx lesion  . Cataract   . Diabetes mellitus    insulin-dependent; Dr. Chalmers Cater  . Dyslipidemia   . Hypertension   . Hypothyroidism    Dr. Chalmers Cater  . Stroke [facility])    Current Outpatient Medications on File Prior to Visit  Medication Sig Dispense Refill  . amLODipine (NORVASC) 5 MG tablet TAKE 1 TABLET EVERY DAY 30 tablet 8  . aspirin 81 MG tablet Take 81 mg by mouth daily.     Marland Kitchen atorvastatin (LIPITOR) 80 MG tablet Take 80 mg by mouth daily.      . clarithromycin (BIAXIN) 500 MG tablet Take 1 tablet (500 mg total) by mouth 2 (two) times daily. 20 tablet 0  . Coenzyme Q10 (CO Q-10 PO) Take 1 capsule by mouth daily.    . hydrochlorothiazide (HYDRODIURIL) 25 MG tablet Take 25 mg by mouth daily.    Marland Kitchen levothyroxine (SYNTHROID, LEVOTHROID) 50 MCG tablet Take 50 mcg by mouth daily before breakfast.    . loratadine (CLARITIN) 10 MG tablet Take 10 mg by mouth daily as needed for allergies. Reported on 10/09/2015    . ramipril  (ALTACE) 10 MG capsule TAKE ONE CAPSULE EVERY DAY 90 capsule 0  . SitaGLIPtin-MetFORMIN HCl (JANUMET XR) 50-500 MG TB24 Take 1 tablet by mouth 2 (two) times daily.    . Vitamin D, Ergocalciferol, (DRISDOL) 50000 units CAPS capsule TAKE 1 CAPSULE (50,000 UNITS TOTAL) BY MOUTH EVERY 7 (SEVEN) DAYS. 12 capsule 0   No current facility-administered medications on file prior to visit.    ROS as in subjective   Objective: BP 132/60   Pulse 84   Resp 18   Wt 166 lb 9.6 oz (75.6 kg)   SpO2 96%   BMI 27.72 kg/m   General appearence: alert, no distress, WD/WN, well appearing compared to last week HEENT: normocephalic, sclerae anicteric, TMs pearly, nares patent, no discharge or erythema, pharynx normal Oral cavity: MMM, no lesions Neck: supple, no lymphadenopathy, no thyromegaly, no masses Heart: RRR, normal S1, S2, no murmurs Lungs: CTA bilaterally, no wheezes, rhonchi, or rales    Assessment: Encounter Diagnoses  Name Primary?  . Pneumonia due to infectious organism, unspecified laterality, unspecified part of lung Yes  . Dizziness     Plan: She seems to be much improved from last week.  She will finish out the antibiotic over the next 3 days.  Advised  continued rest, hydration, advised that the cough may linger for weeks.  I recommended she get a repeat chest x-ray in 3-4 weeks.  However call or return sooner if getting worse again.  She has had significant evaluation for dizziness through multiple providers in the last few months, and interestingly while stopping the 3 medications from last week temporarily the dizziness improved.  I asked her to check her blood pressure and blood sugar the next time she has a dizzy episode.  She can discuss the Janumet with her endocrinologist as she attributes the dizziness to this medication.  However if she  has repeat dizziness in the near future we may consider backing off 1 of her blood pressure medications  Patient Instructions   Recommendations:  Drink plenty of water  Finish out the antibiotic  restart the 3 medications we temporarily stopped last week  If the dizziness returns right away in the next week, let me know and we can consider cutting one of the blood pressure medications in half.  Gradually return to normal activity  Consider repeat xray of chest in 3-4 weeks  If worsening in the next several days, let me know right away     Tearah was seen today for follow-up.  Diagnoses and all orders for this visit:  Pneumonia due to infectious organism, unspecified laterality, unspecified part of lung -     DG Chest 2 View; Future  Dizziness

## 2017-03-26 NOTE — Patient Instructions (Signed)
Recommendations:  Drink plenty of water  Finish out the antibiotic  restart the 3 medications we temporarily stopped last week  If the dizziness returns right away in the next week, let me know and we can consider cutting one of the blood pressure medications in half.  Gradually return to normal activity  Consider repeat xray of chest in 3-4 weeks  If worsening in the next several days, let me know right away

## 2017-04-09 ENCOUNTER — Other Ambulatory Visit: Payer: Self-pay | Admitting: Medical

## 2017-04-16 ENCOUNTER — Ambulatory Visit
Admission: RE | Admit: 2017-04-16 | Discharge: 2017-04-16 | Disposition: A | Discharge status: Home / Self Care | Payer: PRIVATE HEALTH INSURANCE | Source: Ambulatory Visit | Attending: Medical | Admitting: Medical

## 2017-04-16 DIAGNOSIS — J189 Pneumonia, unspecified organism: Secondary | ICD-10-CM

## 2017-04-29 ENCOUNTER — Other Ambulatory Visit: Payer: Self-pay | Admitting: Medical

## 2017-05-11 ENCOUNTER — Ambulatory Visit: Payer: PRIVATE HEALTH INSURANCE | Admitting: Family Medicine

## 2017-05-11 ENCOUNTER — Encounter: Payer: Self-pay | Admitting: Family Medicine

## 2017-05-11 VITALS — BP 154/72 | HR 71 | Wt 173.8 lb

## 2017-05-11 DIAGNOSIS — N3001 Acute cystitis with hematuria: Secondary | ICD-10-CM

## 2017-05-11 LAB — POCT URINALYSIS DIP (PROADVANTAGE DEVICE)
BILIRUBIN UA: NEGATIVE
GLUCOSE UA: NEGATIVE mg/dL
Ketones, POC UA: NEGATIVE mg/dL
Nitrite, UA: POSITIVE — AB
Specific Gravity, Urine: 1.015
Urobilinogen, Ur: 3.5
pH, UA: 6 (ref 5.0–8.0)

## 2017-05-11 MED ORDER — SULFAMETHOXAZOLE-TRIMETHOPRIM 800-160 MG PO TABS: 1.0000 | ORAL_TABLET | Freq: Two times a day (BID) | ORAL | 0 refills | Status: DC

## 2017-05-11 NOTE — Addendum Note (Signed)
Addended by: Elyse Jarvis on: 05/11/2017 02:54 PM   Modules accepted: Orders

## 2017-05-11 NOTE — Progress Notes (Signed)
   Subjective:    Patient ID: Shelly Blevins, female    DOB: 12-09-1945, 72 y.o.   MRN: [id number]  HPI 3 days ago she noted the onset of urinary pressure, seeing blood in her urine with frequency, dysuria and urgency.  No fever, chills.  This is the first time she has ever had any bladder related issues.   Review of Systems     Objective:   Physical Exam Alert and in no distress.  Abdominal exam shows no tenderness to palpation.  Urine microscopic was positive for red cells nitrite.       Assessment & Plan:  Acute cystitis with hematuria - Plan: sulfamethoxazole-trimethoprim (BACTRIM DS,SEPTRA DS) 800-160 MG tablet She will return here in 2 weeks for recheck on her urine.  Discussed the fact that this is not unusual and the hematuria should clear up.

## 2017-05-25 ENCOUNTER — Other Ambulatory Visit (INDEPENDENT_AMBULATORY_CARE_PROVIDER_SITE_OTHER): Payer: PRIVATE HEALTH INSURANCE

## 2017-05-25 DIAGNOSIS — N3091 Cystitis, unspecified with hematuria: Secondary | ICD-10-CM | POA: Diagnosis not present

## 2017-05-25 LAB — POCT URINALYSIS DIP (PROADVANTAGE DEVICE)
BILIRUBIN UA: NEGATIVE mg/dL
Bilirubin, UA: NEGATIVE
Blood, UA: NEGATIVE
Glucose, UA: NEGATIVE mg/dL
Leukocytes, UA: NEGATIVE
Nitrite, UA: NEGATIVE
PROTEIN UA: NEGATIVE mg/dL
SPECIFIC GRAVITY, URINE: 1.025
Urobilinogen, Ur: 3.5
pH, UA: 6 (ref 5.0–8.0)

## 2017-06-30 ENCOUNTER — Encounter: Payer: Self-pay | Admitting: Medical

## 2017-06-30 ENCOUNTER — Ambulatory Visit: Payer: PRIVATE HEALTH INSURANCE | Admitting: Medical

## 2017-06-30 VITALS — BP 128/80 | HR 79 | Temp 98.3°F | Wt 170.8 lb

## 2017-06-30 DIAGNOSIS — J0100 Acute maxillary sinusitis, unspecified: Secondary | ICD-10-CM

## 2017-06-30 DIAGNOSIS — R040 Epistaxis: Secondary | ICD-10-CM

## 2017-06-30 DIAGNOSIS — G4489 Other headache syndrome: Secondary | ICD-10-CM | POA: Diagnosis not present

## 2017-06-30 MED ORDER — AMOXICILLIN 875 MG PO TABS: 875.0000 mg | ORAL_TABLET | Freq: Two times a day (BID) | ORAL | 0 refills | Status: DC

## 2017-06-30 NOTE — Patient Instructions (Signed)
Recommendations:  Drink plenty of water  Continue your daily allergy medication  You can continue nasal saline or Flonase nasal spray but be gentle not too forceful with this  You can use Mucinex OTC for 3-5 days  Use Vaseline or neosporin to coat the inside of both nostrils  Begin amoxicillin antibiotic  You can use Tylenol for pain  If not seeing improvement by Friday or Monday, then call back  If you continue to get headaches or don't improve within a week, then recheck    Nosebleed, Adult A nosebleed is when blood comes out of the nose. Nosebleeds are common. Usually, they are not a sign of a serious condition. Nosebleeds can happen if a small blood vessel in your nose starts to bleed or if the lining of your nose (mucous membrane) cracks. They are commonly caused by:  Allergies.  Colds.  Picking your nose.  Blowing your nose too hard.  An injury from sticking an object into your nose or getting hit in the nose.  Dry or cold air.  Less common causes of nosebleeds include:  Toxic fumes.  Something abnormal in the nose or in the air-filled spaces in the bones of the face (sinuses).  Growths in the nose, such as polyps.  Medicines or conditions that cause blood to clot slowly.  Certain illnesses or procedures that irritate or dry out the nasal passages.  Follow these instructions at home: When you have a nosebleed:  Sit down and tilt your head slightly forward.  Use a clean towel or tissue to pinch your nostrils under the bony part of your nose. After 10 minutes, let go of your nose and see if bleeding starts again. Do not release pressure before that time. If there is still bleeding, repeat the pinching and holding for 10 minutes until the bleeding stops.  Do not place tissues or gauze in the nose to stop bleeding.  Avoid lying down and avoid tilting your head backward. That may make blood collect in the throat and cause gagging or coughing.  Use a nasal  spray decongestant to help with a nosebleed as told by your health care provider.  Do not use petroleum jelly or mineral oil in your nose. It can drip into your lungs. After a nosebleed:  Avoid blowing your nose or sniffing for a number of hours.  Avoid straining, lifting, or bending at the waist for several days. You may resume other normal activities as you are able.  Use saline spray or a humidifier as told by your health care provider.  Aspirinand blood thinners make bleeding more likely. If you are prescribed these medicines and you suffer from nosebleeds: ? Ask your health care provider if you should stop taking the medicines or if you should adjust the dose. ? Do not stop taking medicines that your health care provider has recommended unless told by your health care provider.  If your nosebleed was caused by dry mucous membranes, use over-the-counter saline nasal spray or gel. This will keep the mucous membranes moist and allow them to heal. If you must use a lubricant: ? Choose one that is water-soluble. ? Use only as much as you need and use it only as often as needed. ? Do not lie down until several hours after you use it. Contact a health care provider if:  You have a fever.  You get nosebleeds often or more often than usual.  You bruise very easily.  You have a nosebleed from  having something stuck in your nose.  You have bleeding in your mouth.  You vomit or cough up brown material.  You have a nosebleed after you start a new medicine. Get help right away if:  You have a nosebleed after a fall or a head injury.  Your nosebleed does not go away after 20 minutes.  You feel dizzy or weak.  You have unusual bleeding from other parts of your body.  You have unusual bruising on other parts of your body.  You become sweaty.  You vomit blood. This information is not intended to replace advice given to you by your health care provider. Make sure you discuss any  questions you have with your health care provider. Document Released: 12/24/2004 Document Revised: 11/14/2015 Document Reviewed: 10/01/2015 Elsevier Interactive Patient Education  [employer].

## 2017-06-30 NOTE — Progress Notes (Signed)
Subjective:  Shelly Blevins is a 72 y.o. female who presents for  Chief Complaint  Patient presents with  . Acute Visit    nose bleeds since saturday, ears stopped up, nose congestion, coughing   Symptoms include 4 day hx/o not feeling well.   Nose has been bleeding every day the same time of day.  Been having bad headaches.  She notes some cough, nasal congestion, ears stopped up.   Thinks it sinus issues.  No fever, no NVD, no sore throat.  She has some itchy eyes.  +sneezing.  Has some cough, dry mild cough.  Has had headache the last 4 days.   No bleeding or bruising anywhere else.   No vision change.   No numbness, no tingling, no weakness, no slurred speech.    Takes about 15-18min to get bleeding to stop.  denies sick contacts.  Past history is significant for allergies, goes get spring flare ups.  Using Claritin.   Patient is not a smoker.  No other aggravating or relieving factors.  No other complaint.    Past Medical History:  Diagnosis Date  . Allergy   . Arthritis   . CAD (coronary artery disease)    mild nonobstructive disease, 30% Cfx lesion  . Cataract   . Diabetes mellitus    insulin-dependent; Dr. Chalmers Cater  . Dyslipidemia   . Hypertension   . Hypothyroidism    Dr. Chalmers Cater  . Stroke [facility])     Current Outpatient Medications on File Prior to Visit  Medication Sig Dispense Refill  . amLODipine (NORVASC) 5 MG tablet TAKE 1 TABLET EVERY DAY 30 tablet 8  . aspirin 81 MG tablet Take 81 mg by mouth daily.     Marland Kitchen atorvastatin (LIPITOR) 80 MG tablet Take 80 mg by mouth daily.      . clarithromycin (BIAXIN) 500 MG tablet Take 1 tablet (500 mg total) by mouth 2 (two) times daily. 20 tablet 0  . Coenzyme Q10 (CO Q-10 PO) Take 1 capsule by mouth daily.    . hydrochlorothiazide (HYDRODIURIL) 25 MG tablet Take 25 mg by mouth daily.    Marland Kitchen levothyroxine (SYNTHROID, LEVOTHROID) 50 MCG tablet Take 50 mcg by mouth daily before breakfast.    . loratadine (CLARITIN) 10 MG tablet Take 10 mg by  mouth daily as needed for allergies. Reported on 10/09/2015    . ramipril (ALTACE) 10 MG capsule TAKE ONE CAPSULE EVERY DAY 90 capsule 0  . SitaGLIPtin-MetFORMIN HCl (JANUMET XR) 50-500 MG TB24 Take 1 tablet by mouth 2 (two) times daily.    Marland Kitchen sulfamethoxazole-trimethoprim (BACTRIM DS,SEPTRA DS) 800-160 MG tablet Take 1 tablet by mouth 2 (two) times daily. 20 tablet 0  . Vitamin D, Ergocalciferol, (DRISDOL) 50000 units CAPS capsule TAKE 1 CAPSULE (50,000 UNITS TOTAL) BY MOUTH EVERY 7 (SEVEN) DAYS. 12 capsule 0  . latanoprost (XALATAN) 0.005 % ophthalmic solution Place 1 drop into both eyes at bedtime.  5  . NOVOLOG MIX 70/30 FLEXPEN (70-30) 100 UNIT/ML FlexPen INJECT 70 UNITS IN MORNING AND 60 IN EVENING  3   No current facility-administered medications on file prior to visit.     ROS as in subjective   Objective: BP 128/80 (BP Location: Right Arm, Patient Position: Sitting, Cuff Size: Normal)   Pulse 79   Temp 98.3 F (36.8 C) (Oral)   Wt 170 lb 12.8 oz (77.5 kg)   SpO2 98%   BMI 28.42 kg/m   Wt Readings from Last 3 Encounters:  06/30/17 170 lb 12.8 oz (77.5 kg)  05/11/17 173 lb 12.8 oz (78.8 kg)  03/26/17 166 lb 9.6 oz (75.6 kg)   General appearance: Alert, well developed, well nourished, no distress                             Skin: warm, no rash                           Head: +maxillary sinus tenderness,                            Eyes: conjunctiva pink, corneas clear                            Ears: flat left tympanic membrane, flat right tympanic membrane, external ear canals normal                          Nose: septum midline, turbinates swollen, with erythema and mucoid discharge but no obvious bleeding or friable area             Mouth/throat: MMM, tongue normal, no pharyngeal erythema                           Neck: supple, no adenopathy, no thyromegaly, non tender                         Lungs: clear, no wheezes, no rales, no rhonchi        Assessment  Encounter  Diagnoses  Name Primary?  . Acute non-recurrent maxillary sinusitis Yes  . Nosebleed   . Headache syndrome       Plan: Discussed symptoms, concerns, likely sinusitis.  Reviewed recommendations below as well as home care for nosebleeds.  If not much improved within a week then recheck, sooner prn.  If headaches persist, then recheck soon.  Recommendations:  Drink plenty of water  Continue your daily allergy medication  You can continue nasal saline or Flonase nasal spray but be gentle not too forceful with this  You can use Mucinex OTC for 3-5 days  Use Vaseline or neosporin to coat the inside of both nostrils  Begin amoxicillin antibiotic  You can use Tylenol for pain  If not seeing improvement by Friday or Monday, then call back  If you continue to get headaches or don't improve within a week, then recheck  Patient voiced understanding of diagnosis, recommendations, and treatment plan.

## 2017-07-03 ENCOUNTER — Other Ambulatory Visit: Payer: Self-pay | Admitting: Medical

## 2017-07-05 NOTE — Telephone Encounter (Signed)
Last vitamin D level checked 11/02/16, okay to refill?

## 2017-07-26 ENCOUNTER — Other Ambulatory Visit: Payer: Self-pay | Admitting: Medical

## 2017-09-27 ENCOUNTER — Other Ambulatory Visit: Payer: Self-pay | Admitting: Medical

## 2017-09-27 NOTE — Telephone Encounter (Signed)
Is this ok to refill?  

## 2017-10-03 ENCOUNTER — Other Ambulatory Visit: Payer: Self-pay | Admitting: Medical

## 2017-10-26 ENCOUNTER — Other Ambulatory Visit: Payer: Self-pay | Admitting: Medical

## 2017-10-26 NOTE — Telephone Encounter (Signed)
Is this ok to refill?  

## 2017-10-28 ENCOUNTER — Other Ambulatory Visit: Payer: Self-pay | Admitting: Medical

## 2017-11-08 ENCOUNTER — Other Ambulatory Visit: Payer: Self-pay | Admitting: Medical

## 2017-11-08 DIAGNOSIS — Z1231 Encounter for screening mammogram for malignant neoplasm of breast: Secondary | ICD-10-CM

## 2017-11-18 LAB — HM DIABETES EYE EXAM

## 2017-12-08 ENCOUNTER — Ambulatory Visit
Admission: RE | Admit: 2017-12-08 | Discharge: 2017-12-08 | Disposition: A | Discharge status: Home / Self Care | Payer: PRIVATE HEALTH INSURANCE | Source: Ambulatory Visit | Attending: Medical | Admitting: Medical

## 2017-12-08 DIAGNOSIS — Z1231 Encounter for screening mammogram for malignant neoplasm of breast: Secondary | ICD-10-CM

## 2017-12-30 LAB — CBC AND DIFFERENTIAL: Hemoglobin: 8.3 — AB (ref 12.0–16.0)

## 2018-01-13 ENCOUNTER — Other Ambulatory Visit: Payer: Self-pay | Admitting: Medical

## 2018-01-13 NOTE — Telephone Encounter (Signed)
Is this okay to refill? 

## 2018-01-29 ENCOUNTER — Other Ambulatory Visit: Payer: Self-pay | Admitting: Medical

## 2018-01-31 NOTE — Telephone Encounter (Signed)
Is this ok to refill?  

## 2018-01-31 NOTE — Telephone Encounter (Signed)
Refill and get in for med check or annual wellness visit

## 2018-02-01 NOTE — Telephone Encounter (Signed)
Patient schedule appointment for next Thursday.

## 2018-02-09 ENCOUNTER — Ambulatory Visit: Payer: PRIVATE HEALTH INSURANCE | Admitting: Medical

## 2018-02-09 VITALS — BP 120/70 | HR 68 | Temp 98.0°F | Resp 16 | Ht 63.0 in | Wt 176.8 lb

## 2018-02-09 DIAGNOSIS — I2510 Atherosclerotic heart disease of native coronary artery without angina pectoris: Secondary | ICD-10-CM | POA: Diagnosis not present

## 2018-02-09 DIAGNOSIS — E785 Hyperlipidemia, unspecified: Secondary | ICD-10-CM

## 2018-02-09 DIAGNOSIS — Z794 Long term (current) use of insulin: Secondary | ICD-10-CM

## 2018-02-09 DIAGNOSIS — Z7189 Other specified counseling: Secondary | ICD-10-CM

## 2018-02-09 DIAGNOSIS — E119 Type 2 diabetes mellitus without complications: Secondary | ICD-10-CM | POA: Diagnosis not present

## 2018-02-09 DIAGNOSIS — IMO0001 Reserved for inherently not codable concepts without codable children: Secondary | ICD-10-CM

## 2018-02-09 DIAGNOSIS — E039 Hypothyroidism, unspecified: Secondary | ICD-10-CM | POA: Diagnosis not present

## 2018-02-09 DIAGNOSIS — Z7185 Encounter for immunization safety counseling: Secondary | ICD-10-CM

## 2018-02-09 DIAGNOSIS — I2583 Coronary atherosclerosis due to lipid rich plaque: Secondary | ICD-10-CM

## 2018-02-09 DIAGNOSIS — I10 Essential (primary) hypertension: Secondary | ICD-10-CM | POA: Diagnosis not present

## 2018-02-09 NOTE — Progress Notes (Signed)
Subjective: Chief Complaint  Patient presents with  . Med check    Med check    Here for med check given last labs on follow-up for over a year ago.    She sees Dr. Chalmers Cater endocrinology for diabetes and thyroid and reportedly had labs and visit in September although I do not have these records  She is compliant with her list of medications.  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  She had a flu shot 3 weeks ago, had pain and swelling in the right arm could not turn her neck for several days.  Hypertension- compliant with amlodipine 5 mg daily, ramipril 10 mg daily, hydrochlorothiazide 25 mg daily  She continues on vitamin D 50,000 units weekly  Compliant with Lipitor 80 mg daily  She is considering cutting back on some of her work and doing some other types of volunteer work or activity  Past Medical History:  Diagnosis Date  . Allergy   . Arthritis   . CAD (coronary artery disease)    mild nonobstructive disease, 30% Cfx lesion  . Cataract   . Diabetes mellitus    insulin-dependent; Dr. Chalmers Cater  . Dyslipidemia   . Hypertension   . Hypothyroidism    Dr. Chalmers Cater  . Stroke [facility])    Current Outpatient Medications on File Prior to Visit  Medication Sig Dispense Refill  . amLODipine (NORVASC) 5 MG tablet TAKE 1 TABLET EVERY DAY 30 tablet 8  . atorvastatin (LIPITOR) 80 MG tablet Take 80 mg by mouth daily.      . hydrochlorothiazide (HYDRODIURIL) 25 MG tablet Take 25 mg by mouth daily.    Marland Kitchen latanoprost (XALATAN) 0.005 % ophthalmic solution Place 1 drop into both eyes at bedtime.  5  . levothyroxine (SYNTHROID, LEVOTHROID) 50 MCG tablet Take 50 mcg by mouth daily before breakfast.    . loratadine (CLARITIN) 10 MG tablet Take 10 mg by mouth daily as needed for allergies. Reported on 10/09/2015    . NOVOLOG MIX 70/30 FLEXPEN (70-30) 100 UNIT/ML FlexPen INJECT 70 UNITS IN MORNING AND 60 IN EVENING  3  .  ramipril (ALTACE) 10 MG capsule TAKE ONE CAPSULE EVERY DAY 30 capsule 2  . SitaGLIPtin-MetFORMIN HCl (JANUMET XR) 50-500 MG TB24 Take 1 tablet by mouth 2 (two) times daily.    . Vitamin D, Ergocalciferol, (DRISDOL) 50000 units CAPS capsule TAKE 1 CAPSULE (50,000 UNITS TOTAL) BY MOUTH EVERY 7 (SEVEN) DAYS. 4 capsule 2  . aspirin 81 MG tablet Take 81 mg by mouth daily.     . Coenzyme Q10 (CO Q-10 PO) Take 1 capsule by mouth daily.     No current facility-administered medications on file prior to visit.    ROS as in subjective   Objective: BP 120/70   Pulse 68   Temp 98 F (36.7 C) (Oral)   Resp 16   Ht 5\' 3"  (1.6 m)   Wt 176 lb 12.8 oz (80.2 kg)   SpO2 98%   BMI 31.32 kg/m   General appearance: alert, no distress, WD/WN,  Neck: supple, no lymphadenopathy, no thyromegaly, no masses Heart: RRR, normal S1, S2, no murmurs Lungs: CTA bilaterally, no wheezes, rhonchi, or rales Pulses: 2+ symmetric, upper and lower extremities, normal cap refill Neuro: A&Ox 4, cn2-12 intact, nonfocal exam Psych: pleasant, good eye contact, answers questions regularly    Assessment: Encounter Diagnoses  Name Primary?  . Essential hypertension Yes  .  Coronary artery disease due to lipid rich plaque   . Controlled type 2 diabetes mellitus without complication, with long-term current use of insulin ([place])   . Hypothyroidism, unspecified type   . Insulin dependent diabetes mellitus ([place])   . Dyslipidemia   . Vaccine counseling      Plan: Hypertension-controlled, compliant with medication  We will request her recent endocrinology note and labs that were done in September.  Advised that she may need to come back for other labs depending on what was run at that visit  Diabetes and hypothyroidism managed by endocrinology  Dyslipidemia-continue statin, follow-up pending lab review from endocrinology  She is up-to-date on flu shot  Patient Instructions  Recommendations:  Vaccines: Check  insurance coverage for the following vaccines:  Pneumococcal 23  Tetanus diptheria  Shingrix (2 shots)  We will contact Dr. Almetta Lovely office for recent labs and visit notes.  I may need you to come back in for labs depending upon Dr. Almetta Lovely records.    Call and make a follow up appointment with Dr. Debara Pickett for yearly evaluation        Ramaya was seen today for med check.  Diagnoses and all orders for this visit:  Essential hypertension  Coronary artery disease due to lipid rich plaque  Controlled type 2 diabetes mellitus without complication, with long-term current use of insulin ([facility])  Hypothyroidism, unspecified type  Insulin dependent diabetes mellitus ([place])  Dyslipidemia  Vaccine counseling

## 2018-02-09 NOTE — Patient Instructions (Addendum)
Recommendations:  Vaccines: Check insurance coverage for the following vaccines:  Pneumococcal 23  Tetanus diptheria  Shingrix (2 shots)  We will contact Dr. Almetta Lovely office for recent labs and visit notes.  I may need you to come back in for labs depending upon Dr. Almetta Lovely records.    Call and make a follow up appointment with Dr. Debara Pickett for yearly evaluation

## 2018-02-17 ENCOUNTER — Telehealth: Payer: Self-pay | Admitting: Internal Medicine

## 2018-02-17 NOTE — Telephone Encounter (Signed)
Pt called and left VM that her insurance will cover shringix and Pneum. Not sure if she said about tetanus shot.    Pt can have Pneum 23 here so this can be scheduled  Shelly Blevins would need to write RX for plain TD, and Shringix so pt can take to pharmacy to get these shots

## 2018-03-15 ENCOUNTER — Ambulatory Visit (INDEPENDENT_AMBULATORY_CARE_PROVIDER_SITE_OTHER): Payer: PRIVATE HEALTH INSURANCE | Admitting: Internal Medicine

## 2018-03-15 ENCOUNTER — Encounter: Payer: Self-pay | Admitting: Internal Medicine

## 2018-03-15 VITALS — BP 146/60 | HR 62 | Ht 63.0 in | Wt 176.0 lb

## 2018-03-15 DIAGNOSIS — I2510 Atherosclerotic heart disease of native coronary artery without angina pectoris: Secondary | ICD-10-CM

## 2018-03-15 DIAGNOSIS — R5383 Other fatigue: Secondary | ICD-10-CM

## 2018-03-15 DIAGNOSIS — E785 Hyperlipidemia, unspecified: Secondary | ICD-10-CM

## 2018-03-15 DIAGNOSIS — R0600 Dyspnea, unspecified: Secondary | ICD-10-CM

## 2018-03-15 DIAGNOSIS — I10 Essential (primary) hypertension: Secondary | ICD-10-CM

## 2018-03-15 DIAGNOSIS — I2583 Coronary atherosclerosis due to lipid rich plaque: Secondary | ICD-10-CM

## 2018-03-15 DIAGNOSIS — R079 Chest pain, unspecified: Secondary | ICD-10-CM

## 2018-03-15 NOTE — Progress Notes (Signed)
OFFICE NOTE  Chief Complaint:  Occasional chest tightness  Primary Care Physician: Carlena Hurl, PA-C  HPI:  Shelly Blevins is a 72 year old female with a history of coronary artery disease with mild nonobstructive coronary disease and 50% (not 30% as previously reported) proximal circumflex lesion. She also has diabetes, insulin dependent, hypertension, and d chest yslipidemia. There is an unfortunate sudden-death history in her family with her sister dying at age 5 and a brother who died of heart failure at 81. Unfortunately, her husband recently died and he had a lot of medical problems in November of 2013. She has had problems with hypothyroidism and was off her thyroid medication for some time but that has been reestablished. Telemetry she is noted some increase in shortness of breath with activities. 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 She also is describing some discomfort in the left upper chest area into the left shoulder. This is actually short-lived but sometimes is worse with exertion and relieved by rest. It is concerning to me for possible angina.  I saw Shelly Blevins back in the office today. Overall she is doing well. She denies any chest pain or worsening shortness of breath. At her last office visit she underwent a nuclear stress test which was negative for ischemia. Her diabetes is been fairly well controlled. She recently saw Dr. Chalmers Cater who is her endocrinologist. Blood pressure is at goal. Cholesterol is also followed by her endocrinologist.  Shelly Blevins returns today for follow-up. In general she is doing quite well. She denies any chest pain or worsening shortness of breath. She denies any palpitations. EKG appears stable. Diabetes is been fairly well controlled. She is on high-dose atorvastatin.  07/10/2016  Shelly Blevins returns today for follow-up. Overall she seems to be doing well. Recently she's had some recurrent  lightheadedness and dizziness. She cause of vertigo however it's not clear that that's the issue. She has had some low blood sugars and her insulin was decreased somewhat. Since then her symptoms may been improved somewhat. Blood pressures recently well-controlled today.  03/15/2018  This is Group is seen today in follow-up.  I asked her how she was feeling she said she is not feeling all that well.  She says at times she gets short of breath and fatigued really easily.  She does have a history of mild nonobstructive coronary disease by cath in the past however recently has had some worsening exertional symptoms.  She underwent stress testing last about 4 years ago which was negative for ischemia.  EKG today shows sinus rhythm with poor R wave progression and a rate of 62-personally reviewed   PMHx:  Past Medical History:  Diagnosis Date  . Allergy   . Arthritis   . CAD (coronary artery disease)    mild nonobstructive disease, 30% Cfx lesion  . Cataract   . Diabetes mellitus    insulin-dependent; Dr. Chalmers Cater  . Dyslipidemia   . Hypertension   . Hypothyroidism    Dr. Chalmers Cater  . Stroke [facility])     Past Surgical History:  Procedure Laterality Date  . ABDOMINAL HYSTERECTOMY    . CARDIAC CATHETERIZATION  02/17/2007   mild CAD with 50% prox L Cfx stenosis, normal LV function (Dr. Norlene Duel)  . CHOLECYSTECTOMY    . EYE SURGERY    . NM MYOCAR PERF WALL MOTION  2009   lexiscan myoview - perfusion defect in anterior myociaral region (  breast attenuation) - low risk scan - EF 69%  . TRANSTHORACIC ECHOCARDIOGRAM  2008   normal study     FAMHx:  Family History  Problem Relation Age of Onset  . Heart failure Mother   . Diabetes Mother   . Sudden death Sister        died at 28  . Heart failure Brother        died at 1    SOCHx:   reports that she quit smoking about 31 years ago. She has never used smokeless tobacco. She reports that she does not drink alcohol or use drugs.  ALLERGIES:    No Known Allergies  ROS: A comprehensive review of systems was negative.  HOME MEDS: Current Outpatient Medications  Medication Sig Dispense Refill  . amLODipine (NORVASC) 5 MG tablet TAKE 1 TABLET EVERY DAY 30 tablet 8  . aspirin 81 MG tablet Take 81 mg by mouth daily.     Marland Kitchen atorvastatin (LIPITOR) 80 MG tablet Take 80 mg by mouth daily.      . Coenzyme Q10 (CO Q-10 PO) Take 1 capsule by mouth daily.    . hydrochlorothiazide (HYDRODIURIL) 25 MG tablet Take 25 mg by mouth daily.    Marland Kitchen latanoprost (XALATAN) 0.005 % ophthalmic solution Place 1 drop into both eyes at bedtime.  5  . levothyroxine (SYNTHROID, LEVOTHROID) 50 MCG tablet Take 50 mcg by mouth daily before breakfast.    . loratadine (CLARITIN) 10 MG tablet Take 10 mg by mouth daily as needed for allergies. Reported on 10/09/2015    . NOVOLOG MIX 70/30 FLEXPEN (70-30) 100 UNIT/ML FlexPen INJECT 70 UNITS IN MORNING AND 60 IN EVENING  3  . ramipril (ALTACE) 10 MG capsule TAKE ONE CAPSULE EVERY DAY 30 capsule 2  . SitaGLIPtin-MetFORMIN HCl (JANUMET XR) 50-500 MG TB24 Take 1 tablet by mouth 2 (two) times daily.    . Vitamin D, Ergocalciferol, (DRISDOL) 50000 units CAPS capsule TAKE 1 CAPSULE (50,000 UNITS TOTAL) BY MOUTH EVERY 7 (SEVEN) DAYS. 4 capsule 2   No current facility-administered medications for this visit.     LABS/IMAGING: No results found for this or any previous visit (from the past 48 hour(s)). No results found.  VITALS: BP (!) 146/60   Pulse 62   Ht 5\' 3"  (1.6 m)   Wt 176 lb (79.8 kg)   BMI 31.18 kg/m   EXAM: General appearance: alert and no distress Neck: no carotid bruit and no JVD Lungs: clear to auscultation bilaterally Heart: regular rate and rhythm, S1, S2 normal, no murmur, click, rub or gallop Abdomen: soft, non-tender; bowel sounds normal; no masses,  no organomegaly Extremities: extremities normal, atraumatic, no cyanosis or edema Pulses: 2+ and symmetric Skin: Skin color, texture, turgor normal.  No rashes or lesions Neurologic: Grossly normal  EKG: Normal sinus rhythm at 62, poor R wave progression-personally reviewed  ASSESSMENT: 1. Progressive fatigue, chest tightness and dyspnea 2. Moderate single vessel coronary disease with a 50% circumflex lesion and 2009 - negative nuclear stress test in 2015 3. Insulin-dependent diabetes 4. Hypertension 5. Dyslipidemia 6. Family history of premature sudden cardiac death Jun 30, 2022. Dyspnea/chest pain  PLAN: 1.   Shelly Blevins has had some progressive fatigue, chest tightness and dyspnea.  She had moderate single-vessel coronary disease in 2009- nuclear stress testing in 2015, but has multiple risk factors including insulin-dependent diabetes, hypertension, dyslipidemia and family history of sudden cardiac death.  I would like for her to undergo repeat stress testing to further  evaluate her symptoms.  Follow-up with me afterwards.  Pixie Casino, MD, [facility], [place] Director of the Advanced Lipid Disorders &  Cardiovascular Risk Reduction Clinic Diplomate of the American Board of Clinical Lipidology Attending Cardiologist  Direct Dial: [phone]  Fax: [phone]  Website:  [email or web address] 03/15/2018, 3:57 PM

## 2018-03-15 NOTE — Patient Instructions (Signed)
Medication Instructions:  Continue current medications If you need a refill on your cardiac medications before your next appointment, please call your pharmacy.   Testing/Procedures: Dr. Debara Pickett has ordered a Lexiscan Myocardial Perfusion Imaging Study.  Please arrive 15 minutes prior to your appointment time for registration and insurance purposes.   The test will take approximately 3 to 4 hours to complete; you may bring reading material.  If someone comes with you to your appointment, they will need to remain in the main lobby due to limited space in the testing area.    How to prepare for your Myocardial Perfusion Test:  Do not eat or drink 3 hours prior to your test, except you may have water.  Do not consume products containing caffeine (regular or decaffeinated) 12 hours prior to your test. (ex: coffee, chocolate, sodas, tea).  Do wear comfortable clothes (no dresses or overalls) and walking shoes, tennis shoes preferred (No heels or open toe shoes are allowed).  Do NOT wear cologne, perfume, aftershave, or lotions (deodorant is allowed).  If you use an inhaler, use it the AM of your test and bring it with you.   If you use a nebulizer, use it the AM of your test.   If these instructions are not followed, your test will have to be rescheduled.   Follow-Up: At [facility], you and your health needs are our priority.  As part of our continuing mission to provide you with exceptional heart care, we have created designated Provider Care Teams.  These Care Teams include your primary Cardiologist (physician) and Advanced Practice Providers (APPs -  Physician Assistants and Nurse Practitioners) who all work together to provide you with the care you need, when you need it. You will need a follow up appointment in 3-4 weeks after stress test.  You may see Dr. Debara Pickett or one of the following Advanced Practice Providers on your designated Care Team: Almyra Deforest, [place] . Fabian Sharp,  PA-C

## 2018-03-29 ENCOUNTER — Telehealth (HOSPITAL_COMMUNITY): Payer: Self-pay

## 2018-03-29 NOTE — Telephone Encounter (Signed)
Encounter complete. 

## 2018-04-01 ENCOUNTER — Ambulatory Visit (HOSPITAL_COMMUNITY)
Admission: RE | Admit: 2018-04-01 | Discharge: 2018-04-01 | Disposition: A | Discharge status: Home / Self Care | Payer: PRIVATE HEALTH INSURANCE | Source: Ambulatory Visit | Attending: Cardiovascular Disease | Admitting: Cardiovascular Disease

## 2018-04-01 DIAGNOSIS — R079 Chest pain, unspecified: Secondary | ICD-10-CM

## 2018-04-01 LAB — MYOCARDIAL PERFUSION IMAGING
CHL CUP NUCLEAR SRS: 2
CHL CUP RESTING HR STRESS: 55 {beats}/min
CSEPPHR: 71 {beats}/min
LVDIAVOL: 84 mL (ref 46–106)
LVSYSVOL: 33 mL
SDS: 2
SSS: 4
TID: 1.14

## 2018-04-01 MED ORDER — TECHNETIUM TC 99M TETROFOSMIN IV KIT
10.3000 | PACK | Freq: Once | INTRAVENOUS | Status: AC | PRN
  Administered 2018-04-01: 10.3 via INTRAVENOUS
  Filled 2018-04-01: qty 11

## 2018-04-01 MED ORDER — REGADENOSON 0.4 MG/5ML IV SOLN
0.4000 mg | Freq: Once | INTRAVENOUS | Status: AC
  Administered 2018-04-01: 0.4 mg via INTRAVENOUS

## 2018-04-01 MED ORDER — TECHNETIUM TC 99M TETROFOSMIN IV KIT
32.8000 | PACK | Freq: Once | INTRAVENOUS | Status: AC | PRN
  Administered 2018-04-01: 32.8 via INTRAVENOUS
  Filled 2018-04-01: qty 33

## 2018-04-05 ENCOUNTER — Other Ambulatory Visit: Payer: Self-pay | Admitting: Medical

## 2018-04-05 ENCOUNTER — Telehealth: Payer: Self-pay | Admitting: Medical

## 2018-04-05 MED ORDER — VITAMIN D 25 MCG (1000 UNIT) PO TABS
1000.0000 [IU] | ORAL_TABLET | Freq: Every day | ORAL | 3 refills | Status: DC
Start: 2018-04-05 — End: 2019-03-17

## 2018-04-05 NOTE — Telephone Encounter (Signed)
Is this ok to refill?  

## 2018-04-05 NOTE — Telephone Encounter (Signed)
Lets stop Vit D 50,000 units and change to 1000 units daily

## 2018-04-06 NOTE — Telephone Encounter (Signed)
Patient notified

## 2018-04-25 ENCOUNTER — Telehealth: Payer: Self-pay | Admitting: Medical

## 2018-04-25 NOTE — Telephone Encounter (Signed)
Received requested records from [facility]. Sending back for review.

## 2018-04-27 ENCOUNTER — Encounter: Payer: Self-pay | Admitting: Medical

## 2018-05-03 ENCOUNTER — Ambulatory Visit (INDEPENDENT_AMBULATORY_CARE_PROVIDER_SITE_OTHER): Payer: PRIVATE HEALTH INSURANCE | Admitting: Internal Medicine

## 2018-05-03 ENCOUNTER — Encounter: Payer: Self-pay | Admitting: Internal Medicine

## 2018-05-03 VITALS — BP 146/71 | HR 64 | Wt 177.0 lb

## 2018-05-03 DIAGNOSIS — R079 Chest pain, unspecified: Secondary | ICD-10-CM | POA: Diagnosis not present

## 2018-05-03 DIAGNOSIS — R5383 Other fatigue: Secondary | ICD-10-CM | POA: Diagnosis not present

## 2018-05-03 DIAGNOSIS — R0600 Dyspnea, unspecified: Secondary | ICD-10-CM

## 2018-05-03 NOTE — Progress Notes (Signed)
OFFICE NOTE  Chief Complaint:  Follow-up stress test  Primary Care Physician: Carlena Hurl, PA-C  HPI:  Shelly Blevins is a 73 year old female with a history of coronary artery disease with mild nonobstructive coronary disease and 50% (not 30% as previously reported) proximal circumflex lesion. She also has diabetes, insulin dependent, hypertension, and d chest yslipidemia. There is an unfortunate sudden-death history in her family with her sister dying at age 20 and a brother who died of heart failure at 24. Unfortunately, her husband recently died and he had a lot of medical problems in November of 2013. She has had problems with hypothyroidism and was off her thyroid medication for some time but that has been reestablished. Telemetry she is noted some increase in shortness of breath with activities. 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 She also is describing some discomfort in the left upper chest area into the left shoulder. Shelly is actually short-lived but sometimes is worse with exertion and relieved by rest. It is concerning to me for possible angina.  I saw Shelly Blevins back in the office today. Overall she is doing well. She denies any chest pain or worsening shortness of breath. At her last office visit she underwent a nuclear stress test which was negative for ischemia. Her diabetes is been fairly well controlled. She recently saw Dr. Chalmers Cater who is her endocrinologist. Blood pressure is at goal. Cholesterol is also followed by her endocrinologist.  Shelly Blevins returns today for follow-up. In general she is doing quite well. She denies any chest pain or worsening shortness of breath. She denies any palpitations. EKG appears stable. Diabetes is been fairly well controlled. She is on high-dose atorvastatin.  07/10/2016  Shelly Blevins returns today for follow-up. Overall she seems to be doing well. Recently she's had some recurrent  lightheadedness and dizziness. She cause of vertigo however it's not clear that that's the issue. She has had some low blood sugars and her insulin was decreased somewhat. Since then her symptoms may been improved somewhat. Blood pressures recently well-controlled today.  03/15/2018  Shelly is Blevins is seen today in follow-up.  I asked her how she was feeling she said she is not feeling all that well.  She says at times she gets short of breath and fatigued really easily.  She does have a history of mild nonobstructive coronary disease by cath in the past however recently has had some worsening exertional symptoms.  She underwent stress testing last about 4 years ago which was negative for ischemia.  EKG today shows sinus rhythm with poor R wave progression and a rate of 62-personally reviewed  05/03/2018  Shelly Blevins returns today for follow-up of her stress test.  Shelly showed normal LV function with no reversible ischemia.  She reports she still gets short of breath with quick movements and significant exertion.  Shelly could be related to some degree of diastolic dysfunction.  Blood pressure is a little bit elevated today.  PMHx:  Past Medical History:  Diagnosis Date  . Allergy   . Arthritis   . CAD (coronary artery disease)    mild nonobstructive disease, 30% Cfx lesion  . Cataract   . Diabetes mellitus    insulin-dependent; Dr. Chalmers Cater  . Dyslipidemia   . Hypertension   . Hypothyroidism    Dr. Chalmers Cater  . Stroke [facility])     Past Surgical History:  Procedure Laterality Date  .  ABDOMINAL HYSTERECTOMY    . CARDIAC CATHETERIZATION  02/17/2007   mild CAD with 50% prox L Cfx stenosis, normal LV function (Dr. Norlene Duel)  . CHOLECYSTECTOMY    . EYE SURGERY    . NM MYOCAR PERF WALL MOTION  2009   lexiscan myoview - perfusion defect in anterior myociaral region (breast attenuation) - low risk scan - EF 69%  . TRANSTHORACIC ECHOCARDIOGRAM  2008   normal study     FAMHx:  Family History    Problem Relation Age of Onset  . Heart failure Mother   . Diabetes Mother   . Sudden death Sister        died at 21  . Heart failure Brother        died at 60    SOCHx:   reports that she quit smoking about 32 years ago. She has never used smokeless tobacco. She reports that she does not drink alcohol or use drugs.  ALLERGIES:  No Known Allergies  ROS: A comprehensive review of systems was negative.  HOME MEDS: Current Outpatient Medications  Medication Sig Dispense Refill  . amLODipine (NORVASC) 5 MG tablet TAKE 1 TABLET EVERY DAY 30 tablet 8  . aspirin 81 MG tablet Take 81 mg by mouth daily.     Marland Kitchen atorvastatin (LIPITOR) 80 MG tablet Take 80 mg by mouth daily.      . cholecalciferol (VITAMIN D3) 25 MCG (1000 UT) tablet Take 1 tablet (1,000 Units total) by mouth daily. 90 tablet 3  . Coenzyme Q10 (CO Q-10 PO) Take 1 capsule by mouth daily.    . hydrochlorothiazide (HYDRODIURIL) 25 MG tablet Take 25 mg by mouth daily.    Marland Kitchen latanoprost (XALATAN) 0.005 % ophthalmic solution Place 1 drop into both eyes at bedtime.  5  . levothyroxine (SYNTHROID, LEVOTHROID) 50 MCG tablet Take 50 mcg by mouth daily before breakfast.    . loratadine (CLARITIN) 10 MG tablet Take 10 mg by mouth daily as needed for allergies. Reported on 10/09/2015    . NOVOLOG MIX 70/30 FLEXPEN (70-30) 100 UNIT/ML FlexPen INJECT 70 UNITS IN MORNING AND 60 IN EVENING  3  . ramipril (ALTACE) 10 MG capsule TAKE ONE CAPSULE EVERY DAY 30 capsule 2  . SitaGLIPtin-MetFORMIN HCl (JANUMET XR) 50-500 MG TB24 Take 1 tablet by mouth 2 (two) times daily.     No current facility-administered medications for Shelly visit.     LABS/IMAGING: No results found for Shelly or any previous visit (from the past 48 hour(s)). No results found.  VITALS: BP (!) 146/71   Pulse 64   Wt 177 lb (80.3 kg)   SpO2 96%   BMI 31.35 kg/m   EXAM: Deferred  EKG: Deferred  ASSESSMENT: 1. Progressive fatigue, chest tightness and dyspnea -low  risk Myoview stress test, normal LV function  (03/2018) 2. Moderate single vessel coronary disease with a 50% circumflex lesion and 2009 - negative nuclear stress test in 2015 3. Insulin-dependent diabetes 4. Hypertension  5. Dyslipidemia 6. Family history of premature sudden cardiac death 06-23-2022. Dyspnea/chest pain  PLAN: 1.   Mrs. Thorington still has shortness of breath only with marked exertion.  She had lower stress test which is reassuring and low risk stress test in 2015 despite having moderate single-vessel coronary disease of the circumflex.  Her symptoms could be due to diastolic dysfunction.  I would recommend continued exercise and monitoring of her symptoms.  If they progressively worsen we could consider further work-up including an echo  and or heart catheterization.  Pixie Casino, MD, [facility], [place] Director of the Advanced Lipid Disorders &  Cardiovascular Risk Reduction Clinic Diplomate of the American Board of Clinical Lipidology Attending Cardiologist  Direct Dial: [phone]  Fax: [phone]  Website:  [email or web address] 05/03/2018, 3:14 PM

## 2018-05-03 NOTE — Patient Instructions (Signed)
Medication Instructions:  Continue current medications If you need a refill on your cardiac medications before your next appointment, please call your pharmacy.   Follow-Up: At [facility], you and your health needs are our priority.  As part of our continuing mission to provide you with exceptional heart care, we have created designated Provider Care Teams.  These Care Teams include your primary Cardiologist (physician) and Advanced Practice Providers (APPs -  Physician Assistants and Nurse Practitioners) who all work together to provide you with the care you need, when you need it. You will need a follow up appointment in 6 months.  Please call our office 2 months in advance to schedule this appointment.  You may see Dr. Hilty or one of the following Advanced Practice Providers on your designated Care Team: [name], PA-C . [name], PA-C  Any Other Special Instructions Will Be Listed Below (If Applicable).    

## 2018-05-09 ENCOUNTER — Other Ambulatory Visit: Payer: Self-pay | Admitting: Medical

## 2018-05-20 ENCOUNTER — Encounter: Payer: Self-pay | Admitting: Medical

## 2018-06-14 ENCOUNTER — Emergency Department (HOSPITAL_COMMUNITY)
Admission: EM | Admit: 2018-06-14 | Discharge: 2018-06-14 | Disposition: A | Discharge status: Home / Self Care | Payer: PRIVATE HEALTH INSURANCE | Attending: Emergency Medicine | Admitting: Emergency Medicine

## 2018-06-14 ENCOUNTER — Encounter (HOSPITAL_COMMUNITY): Payer: Self-pay

## 2018-06-14 ENCOUNTER — Other Ambulatory Visit: Payer: Self-pay

## 2018-06-14 ENCOUNTER — Ambulatory Visit: Payer: PRIVATE HEALTH INSURANCE

## 2018-06-14 DIAGNOSIS — I2510 Atherosclerotic heart disease of native coronary artery without angina pectoris: Secondary | ICD-10-CM | POA: Diagnosis not present

## 2018-06-14 DIAGNOSIS — Z794 Long term (current) use of insulin: Secondary | ICD-10-CM | POA: Insufficient documentation

## 2018-06-14 DIAGNOSIS — E039 Hypothyroidism, unspecified: Secondary | ICD-10-CM | POA: Insufficient documentation

## 2018-06-14 DIAGNOSIS — Z79899 Other long term (current) drug therapy: Secondary | ICD-10-CM | POA: Insufficient documentation

## 2018-06-14 DIAGNOSIS — I10 Essential (primary) hypertension: Secondary | ICD-10-CM | POA: Diagnosis not present

## 2018-06-14 DIAGNOSIS — E119 Type 2 diabetes mellitus without complications: Secondary | ICD-10-CM | POA: Insufficient documentation

## 2018-06-14 DIAGNOSIS — Z7982 Long term (current) use of aspirin: Secondary | ICD-10-CM | POA: Insufficient documentation

## 2018-06-14 DIAGNOSIS — R040 Epistaxis: Secondary | ICD-10-CM

## 2018-06-14 DIAGNOSIS — Z87891 Personal history of nicotine dependence: Secondary | ICD-10-CM | POA: Diagnosis not present

## 2018-06-14 NOTE — Discharge Instructions (Addendum)

## 2018-06-14 NOTE — ED Provider Notes (Signed)
[place] DEPT Provider Note   CSN: [id number] Arrival date & time: 06/14/18  1057    History   Chief Complaint Chief Complaint  Patient presents with  . Epistaxis    HPI Shelly Blevins is a 73 y.o. female.     HPI 73 year old female comes in a chief complaint of bloody nose.  Patient has history of diabetes, hypertension, hyperlipidemia, stroke. Patient reports that she had nosebleed earlier in the morning today.  The bleeding stopped spontaneously, however while she was at work she started having massive amount of bleeding again.  Patient was bleeding from both of her nares, but it was worse on the left side.  She is not on any blood thinners.  No history of similar symptoms in the past.  No recent infections or trauma.  Past Medical History:  Diagnosis Date  . Allergy   . Arthritis   . CAD (coronary artery disease)    mild nonobstructive disease, 30% Cfx lesion  . Cataract   . Diabetes mellitus    insulin-dependent; Dr. Chalmers Blevins  . Dyslipidemia   . Hypertension   . Hypothyroidism    Dr. Chalmers Blevins  . Stroke [facility])     Patient Active Problem List   Diagnosis Date Noted  . Dizziness 11/02/2016  . Ataxia 11/02/2016  . Estrogen deficiency 11/02/2016  . Fall 11/02/2016  . Controlled type 2 diabetes mellitus without complication, with long-term current use of insulin ([place]) 11/02/2016  . Vaccine counseling 10/09/2015  . Advanced directives, counseling/discussion 10/09/2015  . Need for prophylactic vaccination against Streptococcus pneumoniae (pneumococcus) 10/09/2015  . Cancer screening 10/09/2015  . Hypothyroidism 04/17/2015  . Essential hypertension 06/06/2013  . Dyslipidemia 06/06/2013  . Coronary artery disease due to lipid rich plaque 06/06/2013  . Insulin dependent diabetes mellitus ([place]) 10/29/2008  . GERD 10/29/2008  . CONSTIPATION 10/29/2008    Past Surgical History:  Procedure Laterality Date  . ABDOMINAL HYSTERECTOMY    .  CARDIAC CATHETERIZATION  02/17/2007   mild CAD with 50% prox L Cfx stenosis, normal LV function (Dr. Norlene Duel)  . CHOLECYSTECTOMY    . EYE SURGERY    . NM MYOCAR PERF WALL MOTION  2009   lexiscan myoview - perfusion defect in anterior myociaral region (breast attenuation) - low risk scan - EF 69%  . TRANSTHORACIC ECHOCARDIOGRAM  2008   normal study      OB History   No obstetric history on file.      Home Medications    Prior to Admission medications   Medication Sig Start Date End Date Taking? Authorizing Provider  amLODipine (NORVASC) 5 MG tablet TAKE 1 TABLET EVERY DAY Patient taking differently: Take 5 mg by mouth daily.  10/28/17  Yes Tysinger, Camelia Eng, PA-C  aspirin 81 MG tablet Take 81 mg by mouth daily.    Yes [provider]  atorvastatin (LIPITOR) 80 MG tablet Take 80 mg by mouth daily.     Yes [provider]  cholecalciferol (VITAMIN D3) 25 MCG (1000 UT) tablet Take 1 tablet (1,000 Units total) by mouth daily. 04/05/18  Yes Tysinger, Camelia Eng, PA-C  Coenzyme Q10 (CO Q-10 PO) Take 1 capsule by mouth daily.   Yes [provider]  hydrochlorothiazide (HYDRODIURIL) 25 MG tablet Take 25 mg by mouth daily.   Yes [provider]  ibuprofen (ADVIL,MOTRIN) 200 MG tablet Take 400 mg by mouth every 6 (six) hours as needed for headache or moderate pain.   Yes [provider]  latanoprost (XALATAN) 0.005 % ophthalmic solution Place 1 drop into both eyes at bedtime. 06/18/17  Yes [provider]  levothyroxine (SYNTHROID, LEVOTHROID) 50 MCG tablet Take 50 mcg by mouth daily before breakfast.   Yes [provider]  lidocaine (XYLOCAINE) 5 % ointment Apply 1 application topically as needed for mild pain.   Yes [provider]  NOVOLOG MIX 70/30 FLEXPEN (70-30) 100 UNIT/ML FlexPen Inject 60 Units into the skin 2 (two) times daily.  06/15/17  Yes [provider]  ramipril (ALTACE) 10 MG capsule TAKE ONE CAPSULE  EVERY DAY Patient taking differently: Take 10 mg by mouth daily.  05/09/18  Yes Tysinger, Camelia Eng, PA-C  SitaGLIPtin-MetFORMIN HCl (JANUMET XR) 50-500 MG TB24 Take 1 tablet by mouth 2 (two) times daily.   Yes [provider]    Family History Family History  Problem Relation Age of Onset  . Heart failure Mother   . Diabetes Mother   . Sudden death Sister        died at 15  . Heart failure Brother        died at 52    Social History Social History   Tobacco Use  . Smoking status: Former Smoker    Last attempt to quit: 04/02/1986    Years since quitting: 32.2  . Smokeless tobacco: Never Used  Substance Use Topics  . Alcohol use: No  . Drug use: No     Allergies   Patient has no known allergies.   Review of Systems Review of Systems  Constitutional: Positive for activity change.  HENT: Positive for nosebleeds. Negative for sinus pressure and sinus pain.   Skin: Negative for rash.  Hematological: Does not bruise/bleed easily.     Physical Exam Updated Vital Signs BP (!) 141/63   Pulse 65   Temp 98.1 F (36.7 C)   Resp 16   Wt 78.9 kg   SpO2 100%   BMI 30.82 kg/m   Physical Exam Vitals signs and nursing note reviewed.  Constitutional:      Appearance: She is well-developed.  HENT:     Head: Normocephalic and atraumatic.     Nose:     Comments: Patient has clots in both of her nares.  No active bleeding. Neck:     Musculoskeletal: Normal range of motion and neck supple.  Cardiovascular:     Rate and Rhythm: Normal rate.  Pulmonary:     Effort: Pulmonary effort is normal.  Abdominal:     General: Bowel sounds are normal.  Skin:    General: Skin is warm and dry.  Neurological:     Mental Status: She is alert and oriented to person, place, and time.      ED Treatments / Results  Labs (all labs ordered are listed, but only abnormal results are displayed) Labs Reviewed - No data to display  EKG None  Radiology No results found.   Procedures Procedures (including critical care time)  Medications Ordered in ED Medications - No data to display   Initial Impression / Assessment and Plan / ED Course  I have reviewed the triage vital signs and the nursing notes.  Pertinent labs & imaging results that were available during my care of the patient were reviewed by me and considered in my medical decision making (see chart for details).        Patient had come into the ER with chief complaint of nosebleed.  Prior to ED arrival  she had packed her nose and was not having any active bleeding when the packing was removed.  We observed in the ER for short period of time and patient did not have any recurrence of her bleed.  She is not on any blood thinners, there is no trauma, no infection.  Stable for discharge.  Final Clinical Impressions(s) / ED Diagnoses   Final diagnoses:  Epistaxis    ED Discharge Orders    None       Varney Biles, MD 06/14/18 1702

## 2018-06-14 NOTE — ED Notes (Signed)
Bed: FK81 Expected date:  Expected time:  Means of arrival:  Comments: EMS-nose bleed

## 2018-06-14 NOTE — ED Triage Notes (Signed)
Per EMS: Pt from work with a nose bleed for about 1.5-2 hours.  Pt had one this am before work that last a few minutes also.  Pt c/o of minor dizziness.

## 2018-08-01 ENCOUNTER — Other Ambulatory Visit: Payer: Self-pay | Admitting: Medical

## 2018-08-01 NOTE — Telephone Encounter (Signed)
Are these ok to refill? 

## 2018-08-31 ENCOUNTER — Telehealth: Payer: Self-pay | Admitting: Medical

## 2018-08-31 ENCOUNTER — Ambulatory Visit (INDEPENDENT_AMBULATORY_CARE_PROVIDER_SITE_OTHER): Payer: PRIVATE HEALTH INSURANCE | Admitting: Medical

## 2018-08-31 ENCOUNTER — Encounter: Payer: Self-pay | Admitting: Medical

## 2018-08-31 ENCOUNTER — Other Ambulatory Visit: Payer: Self-pay

## 2018-08-31 VITALS — Temp 97.0°F | Wt 174.0 lb

## 2018-08-31 DIAGNOSIS — Z87891 Personal history of nicotine dependence: Secondary | ICD-10-CM | POA: Diagnosis not present

## 2018-08-31 DIAGNOSIS — R059 Cough, unspecified: Secondary | ICD-10-CM

## 2018-08-31 DIAGNOSIS — R0602 Shortness of breath: Secondary | ICD-10-CM

## 2018-08-31 DIAGNOSIS — R05 Cough: Secondary | ICD-10-CM

## 2018-08-31 MED ORDER — ALBUTEROL SULFATE HFA 108 (90 BASE) MCG/ACT IN AERS: 2.0000 | INHALATION_SPRAY | Freq: Four times a day (QID) | RESPIRATORY_TRACT | 0 refills | Status: DC | PRN

## 2018-08-31 MED ORDER — DOXYCYCLINE HYCLATE 100 MG PO TABS: 100.0000 mg | ORAL_TABLET | Freq: Two times a day (BID) | ORAL | 0 refills | Status: DC

## 2018-08-31 NOTE — Telephone Encounter (Signed)
Please give her info and schedule Covid test at [facility].  She has cough, wheezing for weeks, works at [employer], and 1 reported + case, but she doesn't think she was exposed but not sure  I put an order in computer but not sure if I did it correctly

## 2018-08-31 NOTE — Progress Notes (Signed)
This visit type was conducted due to national recommendations for restrictions regarding the COVID-19 Pandemic (e.g. social distancing) in an effort to limit this patient's exposure and mitigate transmission in our community.  Due to their co-morbid illnesses, this patient is at least at moderate risk for complications without adequate follow up.  This format is felt to be most appropriate for this patient at this time.    Documentation for virtual audio and video telecommunications through Zoom encounter:  The patient was located at home. The provider was located in the office. The patient did consent to this visit and is aware of possible charges through their insurance for this visit.  The other persons participating in this telemedicine service were none. Time spent on call was 15 minutes and in review of previous records >20 minutes total.  This virtual service is not related to other E/M service within previous 7 days.   Subjective:  Shelly Blevins is a 73 y.o. female who presents for possible bronchitis.  Virtual consult today.  She is concerned about bronchitis.  She notes starting in early part of the year she had some allergy symptoms that got worse in the spring.  But as the symptoms of the allergies went away such as runny nose and sneezing, she started having shortness of breath and wheezing and occasional cough.  In the last 2 weeks she has had little worse time with shortness of breath and wheezing worse at nighttime.  In general she lies somewhat inclined does not only lay flat when sleeping.  She has been getting up short of breath recently.  She denies fever, NVD, no body aches, no chills.   Using nothing for symptom.  She has former history of tobacco use for many years but quit years ago.   She does have history bronchitis a few times yearly.  Thinks this is bronchitis.  Denies chest pain, no hemoptysis, no swelling in legs, no weight changes.     She works at nursing  facility.   There was recent reported covid + worker but he was only in the kitchen and she has not been around this person.   No other covid contact, no recent travel.  No other aggravating or relieving factors.  No other c/o.  The following portions of the patient's history were reviewed and updated as appropriate: allergies, current medications, past family history, past medical history, past social history, past surgical history and problem list.  ROS as in subjective  Past Medical History:  Diagnosis Date  . Allergy   . Arthritis   . CAD (coronary artery disease)    mild nonobstructive disease, 30% Cfx lesion  . Cataract   . Diabetes mellitus    insulin-dependent; Dr. Chalmers Cater  . Dyslipidemia   . Hypertension   . Hypothyroidism    Dr. Chalmers Cater  . Stroke ([place])      Objective: Temp (!) 97 F (36.1 C) (Oral)   Wt 174 lb (78.9 kg)   BMI 30.82 kg/m   Not seen in office due to Covid 19 stay at home measures Not dyspneic, wd, wn, nad       Assessment: Encounter Diagnoses  Name Primary?  . SOB (shortness of breath) Yes  . Former smoker   . Cough      Plan:  Discussed symptoms and concerns.   Begin medications below for suspected respiratory tract infection.   Can use Tylenol for fever and malaise.  although she feels she is low risk and  no specific Covid contact, advised she go to testing site at [facility] location for testing for Covid given + coworker who she may or may have not had contact with.  Advised rest, self quarantine, and call back in 2 days with symptom update .     [name] was seen today for cough.  Diagnoses and all orders for this visit:  SOB (shortness of breath) -     Novel Coronavirus, NAA ([facility])  [place] testing site [place]  Former smoker -     Novel Coronavirus, NAA ([facility])  [place]  Cough -     Novel Coronavirus, NAA ([facility])  [place] testing [place]  Other orders -     albuterol (VENTOLIN HFA) 108 (90  Base) MCG/ACT inhaler; Inhale 2 puffs into the lungs every 6 (six) hours as needed for wheezing or shortness of breath. -     doxycycline (VIBRA-TABS) 100 MG tablet; Take 1 tablet (100 mg total) by mouth 2 (two) times daily.

## 2018-09-01 ENCOUNTER — Telehealth: Payer: Self-pay | Admitting: *Deleted

## 2018-09-01 ENCOUNTER — Other Ambulatory Visit: Payer: PRIVATE HEALTH INSURANCE

## 2018-09-01 DIAGNOSIS — Z20822 Contact with and (suspected) exposure to covid-19: Secondary | ICD-10-CM

## 2018-09-01 NOTE — Telephone Encounter (Signed)
Sent message to [place] [facility] they will advise when pt was contacted. [place]

## 2018-09-01 NOTE — Telephone Encounter (Addendum)
Patient is asymptomatic and does not want the testing at this time. Referred by Elyse Jarvis, RMA. No provider information within the referral. [place]?Routing information to PCP.

## 2018-09-01 NOTE — Telephone Encounter (Signed)
Reached out to the patient again. She will go for testing today at the [facility] site at 3:45p. Made aware to wear mask and stay in vehicle.

## 2018-09-01 NOTE — Telephone Encounter (Signed)
Hello Shelly Blevins  I did a virtual visit with her yesterday for "bronchitis", and she did have cough and wheezing.   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   Nevertheless, I think she should have come for testing.     Just an Laureen Abrahams

## 2018-09-01 NOTE — Addendum Note (Signed)
Addended by: Tarry Kos on: 09/01/2018 03:10 PM   Modules accepted: Orders

## 2018-09-02 ENCOUNTER — Telehealth: Payer: Self-pay | Admitting: Medical

## 2018-09-02 NOTE — Telephone Encounter (Signed)
P.A. ALBUTEROL VENTOLIN.  Called pharmacy, no preferred listed,  [facility] [phone] Ventolin is preferred.  Called pharmacy went thru for $35, she will try to run with discount card see if can get any cheaper. Left message for pt.

## 2018-09-04 LAB — NOVEL CORONAVIRUS, NAA: SARS-CoV-2, NAA: NOT DETECTED

## 2018-09-06 ENCOUNTER — Telehealth: Payer: Self-pay | Admitting: Medical

## 2018-09-06 NOTE — Telephone Encounter (Signed)
Call and see how she is doing, and scan and abstract HgbA1C that came in from endocrinology

## 2018-09-07 NOTE — Telephone Encounter (Signed)
Patient states that she is feeling a lot better and she thanks you for your help.

## 2018-09-13 ENCOUNTER — Encounter: Payer: Self-pay | Admitting: Medical

## 2018-09-26 ENCOUNTER — Other Ambulatory Visit: Payer: Self-pay | Admitting: Medical

## 2018-09-26 NOTE — Telephone Encounter (Signed)
Is this ok to refill?  

## 2018-11-14 ENCOUNTER — Other Ambulatory Visit: Payer: Self-pay | Admitting: Medical

## 2018-11-14 DIAGNOSIS — Z1231 Encounter for screening mammogram for malignant neoplasm of breast: Secondary | ICD-10-CM

## 2018-12-02 ENCOUNTER — Telehealth: Payer: PRIVATE HEALTH INSURANCE | Admitting: Internal Medicine

## 2018-12-05 ENCOUNTER — Encounter: Payer: Self-pay | Admitting: Internal Medicine

## 2018-12-12 ENCOUNTER — Telehealth: Payer: Self-pay | Admitting: Internal Medicine

## 2018-12-26 ENCOUNTER — Ambulatory Visit (AMBULATORY_SURGERY_CENTER): Payer: Self-pay

## 2018-12-26 ENCOUNTER — Other Ambulatory Visit: Payer: Self-pay

## 2018-12-26 ENCOUNTER — Ambulatory Visit
Admission: RE | Admit: 2018-12-26 | Discharge: 2018-12-26 | Disposition: A | Discharge status: Home / Self Care | Payer: PRIVATE HEALTH INSURANCE | Source: Ambulatory Visit | Attending: Medical | Admitting: Medical

## 2018-12-26 VITALS — Ht 63.0 in | Wt 178.8 lb

## 2018-12-26 DIAGNOSIS — Z86010 Personal history of colonic polyps: Secondary | ICD-10-CM

## 2018-12-26 DIAGNOSIS — Z1231 Encounter for screening mammogram for malignant neoplasm of breast: Secondary | ICD-10-CM

## 2018-12-26 MED ORDER — NA SULFATE-K SULFATE-MG SULF 17.5-3.13-1.6 GM/177ML PO SOLN: 1.0000 | Freq: Once | ORAL | 0 refills | Status: AC

## 2018-12-26 NOTE — Progress Notes (Signed)
Denies allergies to eggs or soy products. Denies complication of anesthesia or sedation. Denies use of weight loss medication. Denies use of O2.   Emmi instructions given for colonoscopy.  

## 2019-01-06 ENCOUNTER — Telehealth: Payer: Self-pay | Admitting: Internal Medicine

## 2019-01-06 NOTE — Telephone Encounter (Signed)

## 2019-01-09 ENCOUNTER — Encounter: Payer: Self-pay | Admitting: Internal Medicine

## 2019-01-09 ENCOUNTER — Other Ambulatory Visit: Payer: Self-pay

## 2019-01-09 ENCOUNTER — Ambulatory Visit (AMBULATORY_SURGERY_CENTER): Payer: PRIVATE HEALTH INSURANCE | Admitting: Internal Medicine

## 2019-01-09 VITALS — BP 167/68 | HR 54 | Temp 98.7°F | Resp 16 | Ht 63.0 in | Wt 178.8 lb

## 2019-01-09 DIAGNOSIS — D123 Benign neoplasm of transverse colon: Secondary | ICD-10-CM

## 2019-01-09 DIAGNOSIS — Z1211 Encounter for screening for malignant neoplasm of colon: Secondary | ICD-10-CM

## 2019-01-09 DIAGNOSIS — D122 Benign neoplasm of ascending colon: Secondary | ICD-10-CM | POA: Diagnosis not present

## 2019-01-09 MED ORDER — SODIUM CHLORIDE 0.9 % IV SOLN: 500.0000 mL | Freq: Once | INTRAVENOUS | Status: DC

## 2019-01-09 MED ORDER — FLEET ENEMA 7-19 GM/118ML RE ENEM
1.0000 | ENEMA | Freq: Once | RECTAL | Status: AC
  Administered 2019-01-09: 1 via RECTAL

## 2019-01-09 NOTE — Op Note (Signed)
[place] Patient Name: Shelly Blevins Procedure Date: 01/09/2019 8:38 AM MRN: [id number] Endoscopist: Docia Chuck. Henrene Pastor , MD Age: 73 Referring MD:  Date of Birth: 29-Jan-1946 Gender: Female Account #: [id number] Procedure:                Colonoscopy with cold snare polypectomy x 2 Indications:              Screening for colorectal malignant neoplasm.                            Previous examinations 2004 and 2010 both negative                            for neoplasia Medicines:                Monitored Anesthesia Care Procedure:                Pre-Anesthesia Assessment:                           - Prior to the procedure, a History and Physical                            was performed, and patient medications and                            allergies were reviewed. The patient's tolerance of                            previous anesthesia was also reviewed. The risks                            and benefits of the procedure and the sedation                            options and risks were discussed with the patient.                            All questions were answered, and informed consent                            was obtained. Prior Anticoagulants: The patient has                            taken no previous anticoagulant or antiplatelet                            agents. ASA Grade Assessment: II - A patient with                            mild systemic disease. After reviewing the risks                            and benefits, the patient was deemed in  satisfactory condition to undergo the procedure.                           After obtaining informed consent, the colonoscope                            was passed under direct vision. Throughout the                            procedure, the patient's blood pressure, pulse, and                            oxygen saturations were monitored continuously. The                            LOANER 0255 was  introduced through the anus and                            advanced to the the cecum, identified by                            appendiceal orifice and ileocecal valve. The                            ileocecal valve, appendiceal orifice, and rectum                            were photographed. The quality of the bowel                            preparation was excellent. The colonoscopy was                            performed without difficulty. The patient tolerated                            the procedure well. The bowel preparation used was                            SUPREP via split dose instruction. Scope In: 8:51:39 AM Scope Out: 9:08:31 AM Scope Withdrawal Time: 0 hours 13 minutes 55 seconds  Total Procedure Duration: 0 hours 16 minutes 52 seconds  Findings:                 Two polyps were found in the transverse colon and                            ascending colon. The polyps were 1 to 2 mm in size.                            These polyps were removed with a cold snare.                            Resection and retrieval were  complete.                           The exam was otherwise without abnormality on                            direct and retroflexion views. Complications:            No immediate complications. Estimated blood loss:                            None. Estimated Blood Loss:     Estimated blood loss: none. Impression:               - Two 1 to 2 mm polyps in the transverse colon and                            in the ascending colon, removed with a cold snare.                            Resected and retrieved.                           - The examination was otherwise normal on direct                            and retroflexion views. Recommendation:           - Repeat colonoscopy is not recommended for                            surveillance.                           - Patient has a contact number available for                            emergencies. The signs and  symptoms of potential                            delayed complications were discussed with the                            patient. Return to normal activities tomorrow.                            Written discharge instructions were provided to the                            patient.                           - Resume previous diet.                           - Continue present medications.                           -  Await pathology results. Docia Chuck. Henrene Pastor, MD 01/09/2019 9:15:29 AM This report has been signed electronically.

## 2019-01-09 NOTE — Patient Instructions (Signed)
YOU HAD AN ENDOSCOPIC PROCEDURE TODAY AT THE [facility]:   Refer to the procedure report that was given to you for any specific questions about what was found during the examination.  If the procedure report does not answer your questions, please call your gastroenterologist to clarify.  If you requested that your care partner not be given the details of your procedure findings, then the procedure report has been included in a sealed envelope for you to review at your convenience later.  YOU SHOULD EXPECT: Some feelings of bloating in the abdomen. Passage of more gas than usual.  Walking can help get rid of the air that was put into your GI tract during the procedure and reduce the bloating. If you had a lower endoscopy (such as a colonoscopy or flexible sigmoidoscopy) you may notice spotting of blood in your stool or on the toilet paper. If you underwent a bowel prep for your procedure, you may not have a normal bowel movement for a few days.  Please Note:  You might notice some irritation and congestion in your nose or some drainage.  This is from the oxygen used during your procedure.  There is no need for concern and it should clear up in a day or so.  SYMPTOMS TO REPORT IMMEDIATELY:   Following lower endoscopy (colonoscopy or flexible sigmoidoscopy):  Excessive amounts of blood in the stool  Significant tenderness or worsening of abdominal pains  Swelling of the abdomen that is new, acute  Fever of 100F or higher  For urgent or emergent issues, a gastroenterologist can be reached at any hour by calling [phone].   DIET:  We do recommend a small meal at first, but then you may proceed to your regular diet.  Drink plenty of fluids but you should avoid alcoholic beverages for 24 hours.  ACTIVITY:  You should plan to take it easy for the rest of today and you should NOT DRIVE or use heavy machinery until tomorrow (because of the sedation medicines used during the test).     FOLLOW UP: Our staff will call the number listed on your records 48-72 hours following your procedure to check on you and address any questions or concerns that you may have regarding the information given to you following your procedure. If we do not reach you, we will leave a message.  We will attempt to reach you two times.  During this call, we will ask if you have developed any symptoms of COVID 19. If you develop any symptoms (ie: fever, flu-like symptoms, shortness of breath, cough etc.) before then, please call [phone].  If you test positive for Covid 19 in the 2 weeks post procedure, please call and report this information to us.    If any biopsies were taken you will be contacted by phone or by letter within the next 1-3 weeks.  Please call us at [phone] if you have not heard about the biopsies in 3 weeks.    SIGNATURES/CONFIDENTIALITY: You and/or your care partner have signed paperwork which will be entered into your electronic medical record.  These signatures attest to the fact that that the information above on your After Visit Summary has been reviewed and is understood.  Full responsibility of the confidentiality of this discharge information lies with you and/or your care-partner. 

## 2019-01-09 NOTE — Progress Notes (Signed)
JB- Temp CW- Vitals 

## 2019-01-09 NOTE — Progress Notes (Signed)
Pt's states no medical or surgical changes since previsit or office visit. 

## 2019-01-09 NOTE — Progress Notes (Signed)
Called to room to assist during endoscopic procedure.  Patient ID and intended procedure confirmed with present staff. Received instructions for my participation in the procedure from the performing physician.  

## 2019-01-09 NOTE — Progress Notes (Signed)
Report to PACU, RN, vss, BBS= Clear.  

## 2019-01-11 ENCOUNTER — Telehealth: Payer: Self-pay | Admitting: Internal Medicine

## 2019-01-11 ENCOUNTER — Telehealth: Payer: Self-pay

## 2019-01-11 ENCOUNTER — Encounter: Payer: Self-pay | Admitting: Internal Medicine

## 2019-01-11 NOTE — Telephone Encounter (Signed)
Could not LVM

## 2019-01-11 NOTE — Telephone Encounter (Signed)
First attempt follow up call to pt, unable to leave message, quick busy.

## 2019-01-11 NOTE — Telephone Encounter (Signed)
LM for patient that 01/19/2019 visit is on a virtual clinic day. She can keep visit or r/s for first available in office. Asked her to call back to confirm this.   Appointment type changed

## 2019-01-19 ENCOUNTER — Telehealth: Payer: PRIVATE HEALTH INSURANCE | Admitting: Internal Medicine

## 2019-02-07 ENCOUNTER — Other Ambulatory Visit: Payer: Self-pay | Admitting: Medical

## 2019-02-07 NOTE — Telephone Encounter (Signed)
Does this need to come through Cardiologist or [place]?

## 2019-02-09 ENCOUNTER — Telehealth: Payer: Self-pay | Admitting: Medical

## 2019-02-09 NOTE — Telephone Encounter (Signed)
She needs appt with cardiology and looks like she may be due for medicare well with [place] too, please verify

## 2019-02-10 NOTE — Telephone Encounter (Signed)
Pt called back and schedule a CPE. She only has Medicare part A so does not qualify for [place].

## 2019-02-10 NOTE — Telephone Encounter (Signed)
Called and left pt a VM to call the office and schedule

## 2019-02-15 ENCOUNTER — Ambulatory Visit (INDEPENDENT_AMBULATORY_CARE_PROVIDER_SITE_OTHER): Payer: PRIVATE HEALTH INSURANCE | Admitting: Internal Medicine

## 2019-02-15 ENCOUNTER — Encounter: Payer: Self-pay | Admitting: Internal Medicine

## 2019-02-15 ENCOUNTER — Other Ambulatory Visit: Payer: Self-pay

## 2019-02-15 VITALS — BP 133/60 | HR 61 | Ht 63.0 in | Wt 177.4 lb

## 2019-02-15 DIAGNOSIS — Z79899 Other long term (current) drug therapy: Secondary | ICD-10-CM

## 2019-02-15 DIAGNOSIS — E1165 Type 2 diabetes mellitus with hyperglycemia: Secondary | ICD-10-CM

## 2019-02-15 DIAGNOSIS — I2510 Atherosclerotic heart disease of native coronary artery without angina pectoris: Secondary | ICD-10-CM

## 2019-02-15 DIAGNOSIS — R5383 Other fatigue: Secondary | ICD-10-CM | POA: Diagnosis not present

## 2019-02-15 DIAGNOSIS — I10 Essential (primary) hypertension: Secondary | ICD-10-CM

## 2019-02-15 DIAGNOSIS — E785 Hyperlipidemia, unspecified: Secondary | ICD-10-CM

## 2019-02-15 DIAGNOSIS — I2583 Coronary atherosclerosis due to lipid rich plaque: Secondary | ICD-10-CM

## 2019-02-15 MED ORDER — JARDIANCE 10 MG PO TABS: 10.0000 mg | ORAL_TABLET | Freq: Every day | ORAL | 5 refills | Status: DC

## 2019-02-15 NOTE — Progress Notes (Signed)
OFFICE NOTE  Chief Complaint:  Follow-up  Primary Care Physician: Carlena Hurl, PA-C  HPI:  Shelly Blevins is a 73 year old female with a history of coronary artery disease with mild nonobstructive coronary disease and 50% (not 30% as previously reported) proximal circumflex lesion. She also has diabetes, insulin dependent, hypertension, and d chest yslipidemia. There is an unfortunate sudden-death history in her family with her sister dying at age 77 and a brother who died of heart failure at 61. Unfortunately, her husband recently died and he had a lot of medical problems in November of 2013. She has had problems with hypothyroidism and was off her thyroid medication for some time but that has been reestablished. Telemetry she is noted some increase in shortness of breath with activities. 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 She also is describing some discomfort in the left upper chest area into the left shoulder. This is actually short-lived but sometimes is worse with exertion and relieved by rest. It is concerning to me for possible angina.  I saw Shelly Blevins back in the office today. Overall she is doing well. She denies any chest pain or worsening shortness of breath. At her last office visit she underwent a nuclear stress test which was negative for ischemia. Her diabetes is been fairly well controlled. She recently saw Dr. Chalmers Cater who is her endocrinologist. Blood pressure is at goal. Cholesterol is also followed by her endocrinologist.  Shelly Blevins returns today for follow-up. In general she is doing quite well. She denies any chest pain or worsening shortness of breath. She denies any palpitations. EKG appears stable. Diabetes is been fairly well controlled. She is on high-dose atorvastatin.  07/10/2016  Shelly Blevins returns today for follow-up. Overall she seems to be doing well. Recently she's had some recurrent lightheadedness and  dizziness. She cause of vertigo however it's not clear that that's the issue. She has had some low blood sugars and her insulin was decreased somewhat. Since then her symptoms may been improved somewhat. Blood pressures recently well-controlled today.  03/15/2018  This is Shelly Blevins is seen today in follow-up.  I asked her how she was feeling she said she is not feeling all that well.  She says at times she gets short of breath and fatigued really easily.  She does have a history of mild nonobstructive coronary disease by cath in the past however recently has had some worsening exertional symptoms.  She underwent stress testing last about 4 years ago which was negative for ischemia.  EKG today shows sinus rhythm with poor R wave progression and a rate of 62-personally reviewed  05/03/2018  Shelly Blevins returns today for follow-up of her stress test.  This showed normal LV function with no reversible ischemia.  She reports she still gets short of breath with quick movements and significant exertion.  This could be related to some degree of diastolic dysfunction.  Blood pressure is a little bit elevated today.  02/15/2019  Shelly Blevins is seen today in follow-up.  Overall she is doing well.  She denies any worsening shortness of breath or chest pain.  Her most recent hemoglobin A1c was 8.3.  She does have moderate coronary disease.  She is potentially good candidate for the coordinate diabetes study.  She is already on insulin as well as Janumet XR.  Despite this, she has not been able to achieve hemoglobin A1c less than 7 and still  cannot seem to get her weight lower.  Blood pressure is well controlled today.   PMHx:  Past Medical History:  Diagnosis Date  . Allergy   . Arthritis   . CAD (coronary artery disease)    mild nonobstructive disease, 30% Cfx lesion  . Cataract   . Diabetes mellitus    insulin-dependent; Dr. Chalmers Cater  . Dyslipidemia   . GERD (gastroesophageal reflux disease)   . Hyperlipidemia    . Hypertension   . Hypothyroidism    Dr. Chalmers Cater  . Stroke [facility])     Past Surgical History:  Procedure Laterality Date  . ABDOMINAL HYSTERECTOMY    . CARDIAC CATHETERIZATION  02/17/2007   mild CAD with 50% prox L Cfx stenosis, normal LV function (Dr. Norlene Duel)  . CHOLECYSTECTOMY    . EYE SURGERY    . NM MYOCAR PERF WALL MOTION  2009   lexiscan myoview - perfusion defect in anterior myociaral region (breast attenuation) - low risk scan - EF 69%  . THYROIDECTOMY    . TRANSTHORACIC ECHOCARDIOGRAM  2008   normal study     FAMHx:  Family History  Problem Relation Age of Onset  . Heart failure Mother   . Diabetes Mother   . Sudden death Sister        died at 84  . Heart failure Brother        died at 72  . Colon cancer Neg Hx   . Esophageal cancer Neg Hx   . Stomach cancer Neg Hx   . Rectal cancer Neg Hx     SOCHx:   reports that she quit smoking about 32 years ago. She has never used smokeless tobacco. She reports that she does not drink alcohol or use drugs.  ALLERGIES:  No Known Allergies  ROS: A comprehensive review of systems was negative.  HOME MEDS: Current Outpatient Medications  Medication Sig Dispense Refill  . amLODipine (NORVASC) 5 MG tablet TAKE 1 TABLET BY MOUTH EVERY DAY 30 tablet 0  . aspirin 81 MG tablet Take 81 mg by mouth daily.     Marland Kitchen atorvastatin (LIPITOR) 80 MG tablet Take 80 mg by mouth daily.      . cholecalciferol (VITAMIN D3) 25 MCG (1000 UT) tablet Take 1 tablet (1,000 Units total) by mouth daily. 90 tablet 3  . hydrochlorothiazide (HYDRODIURIL) 25 MG tablet Take 25 mg by mouth daily.    Marland Kitchen ibuprofen (ADVIL,MOTRIN) 200 MG tablet Take 400 mg by mouth every 6 (six) hours as needed for headache or moderate pain.    Marland Kitchen latanoprost (XALATAN) 0.005 % ophthalmic solution Place 1 drop into both eyes at bedtime.  5  . levothyroxine (SYNTHROID, LEVOTHROID) 50 MCG tablet Take 50 mcg by mouth daily before breakfast.    . lidocaine (XYLOCAINE) 5 % ointment  Apply 1 application topically as needed for mild pain.    Marland Kitchen NOVOLOG MIX 70/30 FLEXPEN (70-30) 100 UNIT/ML FlexPen Inject 60 Units into the skin 2 (two) times daily.   3  . ramipril (ALTACE) 10 MG capsule TAKE 1 CAPSULE BY MOUTH EVERY DAY 30 capsule 0  . SitaGLIPtin-MetFORMIN HCl (JANUMET XR) 50-500 MG TB24 Take 1 tablet by mouth 2 (two) times daily.    . VENTOLIN HFA 108 (90 Base) MCG/ACT inhaler TAKE 2 PUFFS BY MOUTH EVERY 6 HOURS AS NEEDED FOR WHEEZE OR SHORTNESS OF BREATH 18 g 1   No current facility-administered medications for this visit.     LABS/IMAGING: No results found for  this or any previous visit (from the past 48 hour(s)). No results found.  VITALS: BP 133/60   Pulse 61   Ht 5\' 3"  (1.6 m)   Wt 177 lb 6.4 oz (80.5 kg)   SpO2 99%   BMI 31.42 kg/m   EXAM: General appearance: alert and no distress Neck: no carotid bruit, no JVD and thyroid not enlarged, symmetric, no tenderness/mass/nodules Lungs: clear to auscultation bilaterally Heart: regular rate and rhythm, S1, S2 normal, no murmur, click, rub or gallop Abdomen: soft, non-tender; bowel sounds normal; no masses,  no organomegaly Extremities: extremities normal, atraumatic, no cyanosis or edema Pulses: 2+ and symmetric Skin: Skin color, texture, turgor normal. No rashes or lesions Neurologic: Grossly normal Psych: Pleasant  EKG: Sinus rhythm with sinus arrhythmia at 61-personally reviewed  ASSESSMENT: 1. History of fatigue, chest tightness and dyspnea -low risk Myoview stress test, normal LV function  (03/2018) 2. Moderate single vessel coronary disease with a 50% circumflex lesion and 2009 - negative nuclear stress test in 2015 3. Insulin-dependent diabetes, A1c 8.3 4. Hypertension  5. Dyslipidemia 6. Family history of premature sudden cardiac death 06-29-2022. Dyspnea/chest pain  PLAN: 1.   Mrs. Decarli does report some improvement in her fatigue and chest tightness.  She had a normal Myoview stress in January 2020.   She was noted to have moderate single-vessel coronary disease in 2009.  Her hemoglobin A1c still not at target less than 7 despite being on insulin and Janumet XR.  I believe she is a good candidate to add Jardiance 10 mg daily.  Will plan repeat metabolic profile in about a week.  She is agreeable to participate in a coordinate diabetes trial and our researchers will be in contact with her.  Follow-up with me in 6 months.  Pixie Casino, MD, [facility], [place] Director of the [facility] Lip[facility]  Card[facility] Diplomate of the American Board of Clinical Lipidology Attending Cardiologist  Direct Dial: [phone]  Fax: [phone]  Website:  [email or web address] 02/15/2019, 2:57 PM

## 2019-02-15 NOTE — Patient Instructions (Addendum)
Medication Instructions:  Dr Debara Pickett has recommended making the following medication changes: 1. START Jardiance 10 mg daily  *If you need a refill on your cardiac medications before your next appointment, please call your pharmacy*  Lab Work: Your physician recommends that you return for lab work ONE WEEK after starting [employer].  If you have labs (blood work) drawn today and your tests are completely normal, you will receive your results only by: Marland Kitchen MyChart Message (if you have MyChart) OR . A paper copy in the mail If you have any lab test that is abnormal or we need to change your treatment, we will call you to review the results.  Follow-Up: At [facility], you and your health needs are our priority.  As part of our continuing mission to provide you with exceptional heart care, we have created designated Provider Care Teams.  These Care Teams include your primary Cardiologist (physician) and Advanced Practice Providers (APPs -  Physician Assistants and Nurse Practitioners) who all work together to provide you with the care you need, when you need it.  Your next appointment:   6 month(s)  The format for your next appointment:   In Person  Provider:   You may see Pixie Casino, MD or one of the following Advanced Practice Providers on your designated Care Team:    Almyra Deforest, PA-C  Fabian Sharp, PA-C or   Roby Lofts, PA-C  You will receive a reminder letter in the mail two months in advance. If you don't receive a letter, please call our office to schedule the follow-up appointment.   Medication samples have been provided to the patient. Drug name: Vanessa Ralphs: #7 tabs LOT: [id number] Exp.Date: 03/22  Donivan Scull 3:12 PM 02/15/2019

## 2019-02-16 ENCOUNTER — Telehealth: Payer: Self-pay | Admitting: *Deleted

## 2019-02-16 DIAGNOSIS — Z006 Encounter for examination for normal comparison and control in clinical research program: Secondary | ICD-10-CM

## 2019-02-16 NOTE — Telephone Encounter (Signed)
Coordinate-DiabetesInformed Consent  Subject Name:Shelly Blevins  Subject met inclusion and exclusion criteria. The informed consent form, study requirements and expectations were reviewed with the subject and questions and concerns were addressed prior to the signing of the consent form. The subject verbalized understanding of the trial requirements. The subject agreed to participate in the Coordinate Diabetestrial and gave a verbal consent on 19Nov2020. The informed consent was obtained prior to performance of any protocol-specific procedures for the subject. A copy of the signed informed consent was mailed to the subject and a copy was placed in the subject's medical record.   Oletta Cohn.  This was a verbal consent

## 2019-02-22 ENCOUNTER — Telehealth: Payer: Self-pay | Admitting: Internal Medicine

## 2019-02-22 NOTE — Telephone Encounter (Signed)
New Message  Patient is calling in to get another discount card for medication. States the one that was given to her had expired and could not be used at the pharmacy. Patient states that she will be in for lab work this afternoon and can pick discount card up then. Please call and confirm.

## 2019-02-22 NOTE — Telephone Encounter (Signed)
Spoke to patient placed saving card at front desk-- Patient states she is getting private medical insurance

## 2019-02-23 LAB — BASIC METABOLIC PANEL
BUN/Creatinine Ratio: 14 (ref 12–28)
BUN: 19 mg/dL (ref 8–27)
CO2: 25 mmol/L (ref 20–29)
Calcium: 9.7 mg/dL (ref 8.7–10.3)
Chloride: 97 mmol/L (ref 96–106)
Creatinine, Ser: 1.32 mg/dL — ABNORMAL HIGH (ref 0.57–1.00)
GFR calc Af Amer: 46 mL/min/{1.73_m2} — ABNORMAL LOW (ref >59)
GFR calc non Af Amer: 39 mL/min/{1.73_m2} — ABNORMAL LOW (ref >59)
Glucose: 136 mg/dL — ABNORMAL HIGH (ref 65–99)
Potassium: 3.8 mmol/L (ref 3.5–5.2)
Sodium: 138 mmol/L (ref 134–144)

## 2019-03-14 ENCOUNTER — Other Ambulatory Visit: Payer: Self-pay | Admitting: Medical

## 2019-03-17 ENCOUNTER — Other Ambulatory Visit: Payer: Self-pay | Admitting: Medical

## 2019-03-28 ENCOUNTER — Other Ambulatory Visit: Payer: Self-pay

## 2019-03-28 ENCOUNTER — Ambulatory Visit (INDEPENDENT_AMBULATORY_CARE_PROVIDER_SITE_OTHER): Payer: PRIVATE HEALTH INSURANCE | Admitting: Medical

## 2019-03-28 ENCOUNTER — Encounter: Payer: Self-pay | Admitting: Medical

## 2019-03-28 VITALS — BP 136/70 | HR 64 | Temp 97.7°F | Ht 63.0 in | Wt 173.0 lb

## 2019-03-28 DIAGNOSIS — I2583 Coronary atherosclerosis due to lipid rich plaque: Secondary | ICD-10-CM

## 2019-03-28 DIAGNOSIS — K219 Gastro-esophageal reflux disease without esophagitis: Secondary | ICD-10-CM | POA: Diagnosis not present

## 2019-03-28 DIAGNOSIS — Z0000 Encounter for general adult medical examination without abnormal findings: Secondary | ICD-10-CM | POA: Diagnosis not present

## 2019-03-28 DIAGNOSIS — I2510 Atherosclerotic heart disease of native coronary artery without angina pectoris: Secondary | ICD-10-CM

## 2019-03-28 DIAGNOSIS — M25561 Pain in right knee: Secondary | ICD-10-CM

## 2019-03-28 DIAGNOSIS — I10 Essential (primary) hypertension: Secondary | ICD-10-CM

## 2019-03-28 DIAGNOSIS — G8929 Other chronic pain: Secondary | ICD-10-CM

## 2019-03-28 DIAGNOSIS — E559 Vitamin D deficiency, unspecified: Secondary | ICD-10-CM

## 2019-03-28 DIAGNOSIS — Z7185 Encounter for immunization safety counseling: Secondary | ICD-10-CM

## 2019-03-28 DIAGNOSIS — E785 Hyperlipidemia, unspecified: Secondary | ICD-10-CM

## 2019-03-28 DIAGNOSIS — E2839 Other primary ovarian failure: Secondary | ICD-10-CM

## 2019-03-28 DIAGNOSIS — Z794 Long term (current) use of insulin: Secondary | ICD-10-CM

## 2019-03-28 DIAGNOSIS — Z7189 Other specified counseling: Secondary | ICD-10-CM

## 2019-03-28 DIAGNOSIS — E039 Hypothyroidism, unspecified: Secondary | ICD-10-CM

## 2019-03-28 DIAGNOSIS — E119 Type 2 diabetes mellitus without complications: Secondary | ICD-10-CM

## 2019-03-28 DIAGNOSIS — Z129 Encounter for screening for malignant neoplasm, site unspecified: Secondary | ICD-10-CM

## 2019-03-28 DIAGNOSIS — M25562 Pain in left knee: Secondary | ICD-10-CM

## 2019-03-28 NOTE — Patient Instructions (Signed)
Preventative Care for Adults - Female   Thank you for coming in for your well visit today, and thank you for trusting [place] with your care!   Maintain regular health and wellness exams:  A routine yearly physical is a good way to check in with your primary care provider about your health and preventive screening. It is also an opportunity to share updates about your health and any concerns you have, and receive a thorough all-over exam.   Most health insurance companies pay for at least some preventative services.  Check with your health plan for specific coverages.  What preventative services do women need?  Adult women should have their weight and blood pressure checked regularly.   Women age 23 and older should have their cholesterol levels checked regularly.  Women should be screened for cervical cancer with a Pap smear and pelvic exam beginning at either age 46, or 3 years after they become sexually activity.    Breast cancer screening generally begins at age 80 with a mammogram and breast exam by your primary care provider.    Beginning at age 63 and continuing to age 76, women should be screened for colorectal cancer.  Certain people may need continued testing until age 64.  Updating vaccinations is part of preventative care.  Vaccinations help protect against diseases such as the flu.  Osteoporosis is a disease in which the bones lose minerals and strength as we age. Women ages 53 and over should discuss this with their caregivers, as should women after menopause who have other risk factors.  Lab tests are generally done as part of preventative care to screen for anemia and blood disorders, to screen for problems with the kidneys and liver, to screen for bladder problems, to check blood sugar, and to check your cholesterol level.  Preventative services generally include counseling about diet, exercise, avoiding tobacco, drugs, excessive alcohol consumption, and sexually transmitted  infections.    Xrays and CT scans are not normally done as a preventative test, and most insurances do not pay for imaging for screening other than as discussed under cancer screens below.   On the other hand, if you have certain medical concerns, imaging may be necessary as a diagnostic test.   Your Medical Team Your medical team starts with [place], your PCP or primary care provider.  Please use our services for your routine care such as physicals, screenings, immunizations, sick visits, and your first stop for general medical concerns.  You can call our number for after hours information for urgent questions that may need attention but cannot wait til the next business day.    Urgent care-urgent cares exist to provide care when your primary care office would typically be closed such as evenings or weekends.   Urgent care is for evaluation of urgent medical problems that do not necessarily require emergency department care, but cannot wait til the next business day when we are open.  Emergency department care-please reserve emergency department care for serious, urgent, possibly life-threatening medical problems.  This includes issues like possible stroke, heart attack, significant injury, mental health crisis, or other urgent need that requires immediate medical attention.     See your dentist office twice yearly for hygiene and cleaning visits.   Brush your teeth and floss your teeth daily.  See your eye doctor yearly for routine eye exam and screenings for glaucoma and retinal disease.  See your gynecologist yearly if you do not have your female/gynecological exams at our  office.     Specific Concerns: Go for xrays of both knees. Please go to [facility] for your knee xrays.   Their hours are 8am - 4:30 pm Monday - Friday.  Take your insurance card with you.  [facility] [phone]  [address], [place], [place] [address]  [address], [place]  [address]    Vaccines:  Stay up to date with your tetanus shots and other required immunizations. You should have a booster for tetanus every 10 years. Be sure to get your flu shot every year, since 5%-20% of the [place] population comes down with the flu. The flu vaccine changes each year, so being vaccinated once is not enough. Get your shot in the fall, before the flu season peaks.   Other vaccines to consider:  Pneumococcal vaccine to protect against certain types of pneumonia.  This is normally recommended for adults age 57 or older.  However, adults younger than 75 years old with certain underlying conditions such as diabetes, heart or lung disease should also receive the vaccine.  Shingles vaccine to protect against Varicella Zoster if you are older than age 52, or younger than 75 years old with certain underlying illness.  If you have not had the Shingrix vaccine, please call your insurer to inquire about coverage for the Shingrix vaccine given in 2 doses.   Some insurers cover this vaccine after age 76, some cover this after age 37.  If your insurer covers this, then call to schedule appointment to have this vaccine here  Hepatitis A vaccine to protect against a form of infection of the liver by a virus acquired from food.  Hepatitis B vaccine to protect against a form of infection of the liver by a virus acquired from blood or body fluids, particularly if you work in health care.  If you plan to travel internationally, check with your local health department for specific vaccination recommendations.  Human Papilloma Virus or HPV causes cancer of the cervix, and other infections that can be transmitted from person to person. There is a vaccine for HPV, and males should get immunized between the ages of 58 and 72. It requires a series of 3 shots.   Covid/Coronavirus - as the vaccines are becoming available, please consider vaccination if you are a health care worker, first responder, or have  significant health problems such as asthma, COPD, heart disease, hypertension, diabetes, obesity, multiple medical problems, over age 22yo, or immunocompromised.     I recommend the following vaccines: Check insurance coverage for tetanus booster, shingrix shingles vaccine.  Continue plan to get the Covid vaccine this Saturday.    What should I know about Cancer screening? Many types of cancers can be detected early and may often be prevented. Lung Cancer  You should be screened every year for lung cancer if: ? You are a current smoker who has smoked for at least 30 years. ? You are a former smoker who has quit within the past 15 years.  Talk to your health care provider about your screening options, when you should start screening, and how often you should be screened.  Breast cancer screening is essential to preventive care for women. All women age 73 and older should perform a breast self-exam every month. At age 35 and older, women should have their caregiver complete a breast exam each year. Women at ages 13 and older should have a mammogram (x-ray film) of the breasts. Your caregiver can  discuss how often you need mammograms.    Breast cancer screening   [facility]   [phone]         [phone] [address] [place], [address] [place], [place] [address]        [place], [place] [address]  Colorectal Cancer  Routine colorectal cancer screening usually begins at 75 years of age and should be repeated every 5-10 years until you are 75 years old. You may need to be screened more often if early forms of precancerous polyps or small growths are found. Your health care provider may recommend screening at an earlier age if you have risk factors for colon cancer.  Your health care provider may recommend using home test kits to check for hidden blood in the stool.  A small camera at the end of a tube  can be used to examine your colon (sigmoidoscopy or colonoscopy). This checks for the earliest forms of colorectal cancer.  Skin Cancer  Check your skin from head to toe regularly.  Tell your health care provider about any new moles or changes in moles, especially if: ? There is a change in a mole's size, shape, or color. ? You have a mole that is larger than a pencil eraser.  Always use sunscreen. Apply sunscreen liberally and repeat throughout the day.  Protect yourself by wearing long sleeves, pants, a wide-brimmed hat, and sunglasses when outside.     GENERAL RECOMMENDATIONS FOR GOOD HEALTH:  Healthy diet:  Eat a variety of foods, including fruit, vegetables, animal or vegetable protein, such as meat, fish, chicken, and eggs, or beans, lentils, tofu, and grains, such as rice.  Drink plenty of water daily.  Decrease saturated fat in the diet, avoid lots of red meat, processed foods, sweets, fast foods, and fried foods.  Exercise:  Aerobic exercise helps maintain good heart health. At least 30-40 minutes of moderate-intensity exercise is recommended. For example, a brisk walk that increases your heart rate and breathing. This should be done on most days of the week.   Find a type of exercise or a variety of exercises that you enjoy so that it becomes a part of your daily life.  Examples are running, walking, swimming, water aerobics, and biking.  For motivation and support, explore group exercise such as aerobic class, spin class, Zumba, Yoga,or  martial arts, etc.    Set exercise goals for yourself, such as a certain weight goal, walk or run in a race such as a 5k walk/run.  Speak to your primary care provider about exercise goals.  Your weight readings per our records: Wt Readings from Last 3 Encounters:  03/28/19 173 lb (78.5 kg)  02/15/19 177 lb 6.4 oz (80.5 kg)  01/09/19 178 lb 12.8 oz (81.1 kg)    Body mass index is 30.65 kg/m.    Disease prevention:  If you  smoke or chew tobacco, find out from your caregiver how to quit. It can literally save your life, no matter how long you have been a tobacco user. If you do not use tobacco, never begin.   Maintain a healthy diet and normal weight. Increased weight leads to problems with blood pressure and diabetes.   The Body Mass Index or BMI is a way of measuring how much of your body is fat. Having a BMI above 27 increases the risk of heart  disease, diabetes, hypertension, stroke and other problems related to obesity. Your caregiver can help determine your BMI and based on it develop an exercise and dietary program to help you achieve or maintain this important measurement at a healthful level.  High blood pressure causes heart and blood vessel problems.  Persistent high blood pressure should be treated with medicine if weight loss and exercise do not work.  Your blood pressure readings per our records:     BP Readings from Last 3 Encounters:  03/28/19 136/70  02/15/19 133/60  01/09/19 (!) 167/68     Fat and cholesterol leaves deposits in your arteries that can block them. This causes heart disease and vessel disease elsewhere in your body.  If your cholesterol is found to be high, or if you have heart disease or certain other medical conditions, then you may need to have your cholesterol monitored frequently and be treated with medication.   Ask if you should have a cardiac stress test if your history suggests this. A stress test is a test done on a treadmill that looks for heart disease. This test can find disease prior to there being a problem.  Menopause can be associated with physical symptoms and risks. Hormone replacement therapy is available to decrease these. You should talk to your caregiver about whether starting or continuing to take hormones is right for you.   Osteoporosis is a disease in which the bones lose minerals and strength as we age. This can result in serious bone fractures. Risk of  osteoporosis can be identified using a bone density scan. Women ages 79 and over should discuss this with their caregivers, as should women after menopause who have other risk factors. Ask your caregiver whether you should be taking a calcium supplement and Vitamin D, to reduce the rate of osteoporosis.   Osteoporosis screening/bone density testing:  [facility]   [phone]          [phone] [address] [place], [address] [place], [place] [address]        [place], [place] [address]    Avoid drinking alcohol in excess (more than two drinks per day).  Avoid use of street drugs. Do not share needles with anyone. Ask for professional help if you need assistance or instructions on stopping the use of alcohol, cigarettes, and/or drugs.  Brush your teeth twice a day with fluoride toothpaste, and floss once a day. Good oral hygiene prevents tooth decay and gum disease. The problems can be painful, unattractive, and can cause other health problems. Visit your dentist for a routine oral and dental check up and preventive care every 6-12 months.   Safety:  Use seatbelts 100% of the time, whether driving or as a passenger.  Use safety devices such as hearing protection if you work in environments with loud noise or significant background noise.  Use safety glasses when doing any work that could send debris in to the eyes.  Use a helmet if you ride a bike or motorcycle.  Use appropriate safety gear for contact sports.  Talk to your caregiver about gun safety.  Use sunscreen with a SPF (or skin protection factor) of 15 or greater.  Lighter skinned people are at a greater risk of skin cancer. Don't forget to also wear sunglasses in order to protect your eyes from too much damaging sunlight. Damaging sunlight can  accelerate cataract formation.   Keep carbon monoxide and smoke detectors in your home functioning at all times.  Change the batteries every 6 months or use a model that plugs into the wall.

## 2019-03-28 NOTE — Progress Notes (Signed)
Subjective:    Shelly Blevins is a 73 y.o. female who presents for Preventative Services visit and chronic medical problems/med check visit.    Primary Care Provider Tysinger, Camelia Eng, PA-C here for primary care  Current Health Care Team: Dentist Eye doctor Dr. Scarlette Shorts  Gastroenterology Dr. Lyman Bishop to cardiology Dr. Chalmers Cater, Endocrinology Tysinger, Camelia Eng, PA-C here for primary care   Medical Services you may have received from other than [facility] providers in the past year (date may be approximate) None   Exercise Current exercise habits: Walking  Nutrition/Diet Current diet:  Healthy  Depression Screen Depression screen [facility] 2/9 03/28/2019  Decreased Interest 0  Down, Depressed, Hopeless 0  PHQ - 2 Score 0    Activities of Daily Living Screen/Functional Status Survey Is the patient deaf or have difficulty hearing?: No Does the patient have difficulty seeing, even when wearing glasses/contacts?: No Does the patient have difficulty concentrating, remembering, or making decisions?: Yes(sometimes) Does the patient have difficulty walking or climbing stairs?: Yes(climbing stairs) Does the patient have difficulty dressing or bathing?: No Does the patient have difficulty doing errands alone such as visiting a doctor's office or shopping?: No  Can patient draw a clock face showing 3:15 o'clock - yes  Fall Risk Screen Fall Risk  03/28/2019 02/09/2018 11/02/2016  Falls in the past year? 0 0 Yes  Number falls in past yr: - - 1  Injury with Fall? - - (No Data)  Comment - - itch on on elbow  Risk for fall due to : - - Impaired balance/gait    Gait Assessment: Normal gait observed -yes  Advanced directives Does patient have a [place]? no Does patient have a Living Will? no  Past Medical History:  Diagnosis Date  . Allergy   . Arthritis   . CAD (coronary artery disease)    mild nonobstructive disease, 30% Cfx lesion  . Cataract   .  Diabetes mellitus    insulin-dependent; Dr. Chalmers Cater  . Dyslipidemia   . GERD (gastroesophageal reflux disease)   . Hyperlipidemia   . Hypertension   . Hypothyroidism    Dr. Chalmers Cater  . Stroke [facility])     Past Surgical History:  Procedure Laterality Date  . ABDOMINAL HYSTERECTOMY    . CARDIAC CATHETERIZATION  02/17/2007   mild CAD with 50% prox L Cfx stenosis, normal LV function (Dr. Norlene Duel)  . CHOLECYSTECTOMY    . EYE SURGERY    . NM MYOCAR PERF WALL MOTION  2009   lexiscan myoview - perfusion defect in anterior myociaral region (breast attenuation) - low risk scan - EF 69%  . THYROIDECTOMY    . TRANSTHORACIC ECHOCARDIOGRAM  2008   normal study     Social History   Socioeconomic History  . Marital status: Married    Spouse name: Not on file  . Number of children: 2  . Years of education: 10  . Highest education level: Not on file  Occupational History    Employer: [employer]  Tobacco Use  . Smoking status: Former Smoker    Quit date: 04/02/1986    Years since quitting: 33.0  . Smokeless tobacco: Never Used  Substance and Sexual Activity  . Alcohol use: No  . Drug use: No  . Sexual activity: Not on file  Other Topics Concern  . Not on file  Social History Narrative  . Not on file   Social Determinants of Health   Financial Resource  Strain:   . Difficulty of Paying Living Expenses: Not on file  Food Insecurity:   . Worried About Charity fundraiser in the Last Year: Not on file  . Ran Out of Food in the Last Year: Not on file  Transportation Needs:   . Lack of Transportation (Medical): Not on file  . Lack of Transportation (Non-Medical): Not on file  Physical Activity:   . Days of Exercise per Week: Not on file  . Minutes of Exercise per Session: Not on file  Stress:   . Feeling of Stress : Not on file  Social Connections:   . Frequency of Communication with Friends and Family: Not on file  . Frequency of Social Gatherings with Friends and Family:  Not on file  . Attends Religious Services: Not on file  . Active Member of Clubs or Organizations: Not on file  . Attends Archivist Meetings: Not on file  . Marital Status: Not on file  Intimate Partner Violence:   . Fear of Current or Ex-Partner: Not on file  . Emotionally Abused: Not on file  . Physically Abused: Not on file  . Sexually Abused: Not on file    Family History  Problem Relation Age of Onset  . Heart failure Mother   . Diabetes Mother   . Sudden death Sister        died at 50  . Heart failure Brother        died at 49  . Colon cancer Neg Hx   . Esophageal cancer Neg Hx   . Stomach cancer Neg Hx   . Rectal cancer Neg Hx      Current Outpatient Medications:  .  amLODipine (NORVASC) 5 MG tablet, TAKE 1 TABLET BY MOUTH EVERY DAY, Disp: 30 tablet, Rfl: 0 .  aspirin 81 MG tablet, Take 81 mg by mouth daily. , Disp: , Rfl:  .  atorvastatin (LIPITOR) 80 MG tablet, Take 80 mg by mouth daily.  , Disp: , Rfl:  .  BD PEN NEEDLE NANO 2ND GEN 32G X 4 MM MISC, 2 (two) times daily. as directed, Disp: , Rfl:  .  cholecalciferol (VITAMIN D3) 25 MCG (1000 UT) tablet, TAKE 1 TABLET BY MOUTH EVERY DAY, Disp: 90 tablet, Rfl: 3 .  empagliflozin (JARDIANCE) 10 MG TABS tablet, Take 10 mg by mouth daily before breakfast., Disp: 30 tablet, Rfl: 5 .  hydrochlorothiazide (HYDRODIURIL) 25 MG tablet, Take 25 mg by mouth daily., Disp: , Rfl:  .  ibuprofen (ADVIL,MOTRIN) 200 MG tablet, Take 400 mg by mouth every 6 (six) hours as needed for headache or moderate pain., Disp: , Rfl:  .  latanoprost (XALATAN) 0.005 % ophthalmic solution, Place 1 drop into both eyes at bedtime., Disp: , Rfl: 5 .  levothyroxine (SYNTHROID, LEVOTHROID) 50 MCG tablet, Take 50 mcg by mouth daily before breakfast., Disp: , Rfl:  .  lidocaine (XYLOCAINE) 5 % ointment, Apply 1 application topically as needed for mild pain., Disp: , Rfl:  .  NOVOLOG MIX 70/30 FLEXPEN (70-30) 100 UNIT/ML FlexPen, Inject 60 Units  into the skin 2 (two) times daily. , Disp: , Rfl: 3 .  ramipril (ALTACE) 10 MG capsule, TAKE 1 CAPSULE BY MOUTH EVERY DAY, Disp: 30 capsule, Rfl: 0 .  SitaGLIPtin-MetFORMIN HCl (JANUMET XR) 50-500 MG TB24, Take 1 tablet by mouth 2 (two) times daily., Disp: , Rfl:  .  VENTOLIN HFA 108 (90 Base) MCG/ACT inhaler, TAKE 2 PUFFS BY MOUTH EVERY 6  HOURS AS NEEDED FOR WHEEZE OR SHORTNESS OF BREATH, Disp: 18 g, Rfl: 1  No Known Allergies  History reviewed: allergies, current medications, past family history, past medical history, past social history, past surgical history and problem list  Chronic issues discussed: Knees pains bilat, arthritis, no swelling, some trouble with stairs.  No fall, no trauma.  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  Getting covid vaccine this weekend through work.  Recently started in study including use of Jardiance.  compliant with medications without c/o.    Objective:      Biometrics BP 136/70   Pulse 64   Temp 97.7 F (36.5 C)   Ht 5\' 3"  (1.6 m)   Wt 173 lb (78.5 kg)   SpO2 95%   BMI 30.65 kg/m   Gen: wd, wn ,nad HEENT: normocephalic, sclerae anicteric, TMs pearly, nares patent, no discharge or erythema, pharynx normal Oral cavity: MMM, no lesions Neck: supple, no lymphadenopathy, no thyromegaly, no masses, no bruits Heart: RRR, normal S1, S2, no murmurs Lungs: CTA bilaterally, no wheezes, rhonchi, or rales Abdomen: +bs, soft, non tender, non distended, no masses, no hepatomegaly, no splenomegaly Musculoskeletal: bony arthritic changes noted of both patella sn  nontender, no swelling, no obvious deformity Extremities: no edema, no cyanosis, no clubbing Pulses: 2+ symmetric, upper and lower extremities, normal cap refill Neurological: alert, oriented x 3, CN2-12 intact, strength normal upper extremities and lower extremities, sensation normal throughout, DTRs 2+ throughout, no cerebellar signs, gait  normal Psychiatric: normal affect, behavior normal, pleasant  Breast/gyn - deferred to gynecology   Assessment:   Encounter Diagnoses  Name Primary?  . Encounter for health maintenance examination in adult Yes  . Medicare annual wellness visit, subsequent   . Essential hypertension   . Coronary artery disease due to lipid rich plaque   . Gastroesophageal reflux disease, unspecified whether esophagitis present   . Hypothyroidism, unspecified type   . Controlled type 2 diabetes mellitus without complication, with long-term current use of insulin ([place])   . Dyslipidemia   . Estrogen deficiency   . Cancer screening   . Advanced directives, counseling/discussion   . Vaccine counseling   . Chronic pain of both knees   . Vitamin D deficiency      Plan:   A preventative services visit was completed today.  During the course of the visit today, we discussed and counseled about appropriate screening and preventive services.  A health risk assessment was established today that included a review of current medications, allergies, social history, family history, medical and preventative health history, biometrics, and preventative screenings to identify potential safety concerns or impairments.  A personalized plan was printed today for your records and use.   Personalized health advice and education was given today to reduce health risks and promote self management and wellness.  Information regarding end of life planning was discussed today.  Conditions/risks identified: Chronic bilateral knee pain  Chronic problems discussed today: Chronic knee pain-go for x-rays, likely OA  Diabetes-labs today, but follow-up with endocrinology  Hypertension, dyslipidemia-reviewed cardiology note from November 2020.  She had a normal Myoview stress test January 2020, had mild single-vessel coronary disease in 2009.  Cardiology advised to start Jardiance and she is involved in a pharmaceutic statin    Acute problems discussed today: None  Recommendations:  I recommend a yearly ophthalmology/optometry visit for glaucoma screening and eye checkup  I recommended a yearly dental visit for hygiene and checkup  Advanced  directives - discussed nature and purpose of Advanced Directives, encouraged them to complete them if they have not done so and/or encouraged them to get [place] a copy if they have done this already.  Referrals today: none  Immunizations: I recommended a yearly influenza vaccine, typically in September when the vaccine is usually available She will get Covid vaccine this weekend.  She will check insurance coverage for tetanus booster and Shingrix  Venda was seen today for [employer].  Diagnoses and all orders for this visit:  Encounter for health maintenance examination in adult -     Comprehensive metabolic panel -     CBC with Differential -     Lipid Panel -     Hemoglobin A1c -     Vitamin D, 25-hydroxy -     ABI; Future -     Microalbumin/Creatinine Ratio, Urine -     TSH + free T4  Medicare annual wellness visit, subsequent  Essential hypertension -     Comprehensive metabolic panel -     CBC with Differential -     Lipid Panel  Coronary artery disease due to lipid rich plaque -     Comprehensive metabolic panel -     Lipid Panel  Gastroesophageal reflux disease, unspecified whether esophagitis present  Hypothyroidism, unspecified type -     TSH + free T4  Controlled type 2 diabetes mellitus without complication, with long-term current use of insulin ([facility]) -     Hemoglobin A1c -     ABI; Future -     Microalbumin/Creatinine Ratio, Urine  Dyslipidemia -     Comprehensive metabolic panel -     Lipid Panel  Estrogen deficiency  Cancer screening  Advanced directives, counseling/discussion  Vaccine counseling  Chronic pain of both knees -     DG Knee Complete 4 Views Left; Future -     DG Knee Complete 4 Views Right;  Future  Vitamin D deficiency -     Vitamin D, 25-hydroxy     Medicare Attestation A preventative services visit was completed today.  During the course of the visit the patient was educated and counseled about appropriate screening and preventive services.  A health risk assessment was established with the patient that included a review of current medications, allergies, social history, family history, medical and preventative health history, biometrics, and preventative screenings to identify potential safety concerns or impairments.  A personalized plan was printed today for the patient's records and use.   Personalized health advice and education was given today to reduce health risks and promote self management and wellness.  Information regarding end of life planning was discussed today.  Dorothea Ogle, PA-C   03/30/2019

## 2019-03-29 ENCOUNTER — Ambulatory Visit
Admission: RE | Admit: 2019-03-29 | Discharge: 2019-03-29 | Disposition: A | Discharge status: Home / Self Care | Payer: PRIVATE HEALTH INSURANCE | Source: Ambulatory Visit | Attending: Medical | Admitting: Medical

## 2019-03-29 DIAGNOSIS — G8929 Other chronic pain: Secondary | ICD-10-CM

## 2019-03-29 LAB — CBC WITH DIFFERENTIAL/PLATELET
Basophils Absolute: 0 10*3/uL (ref 0.0–0.2)
Basos: 0 %
EOS (ABSOLUTE): 0.2 10*3/uL (ref 0.0–0.4)
Eos: 2 %
Hematocrit: 36.8 % (ref 34.0–46.6)
Hemoglobin: 12.2 g/dL (ref 11.1–15.9)
Immature Grans (Abs): 0 10*3/uL (ref 0.0–0.1)
Immature Granulocytes: 0 %
Lymphocytes Absolute: 2.9 10*3/uL (ref 0.7–3.1)
Lymphs: 39 %
MCH: 28.7 pg (ref 26.6–33.0)
MCHC: 33.2 g/dL (ref 31.5–35.7)
MCV: 87 fL (ref 79–97)
Monocytes Absolute: 0.6 10*3/uL (ref 0.1–0.9)
Monocytes: 7 %
Neutrophils Absolute: 3.8 10*3/uL (ref 1.4–7.0)
Neutrophils: 52 %
Platelets: 233 10*3/uL (ref 150–450)
RBC: 4.25 x10E6/uL (ref 3.77–5.28)
RDW: 13.6 % (ref 11.7–15.4)
WBC: 7.5 10*3/uL (ref 3.4–10.8)

## 2019-03-29 LAB — MICROALBUMIN / CREATININE URINE RATIO
Creatinine, Urine: 57.3 mg/dL
Microalb/Creat Ratio: 23 mg/g creat (ref 0–29)
Microalbumin, Urine: 13.3 ug/mL

## 2019-03-29 LAB — COMPREHENSIVE METABOLIC PANEL
ALT: 31 IU/L (ref 0–32)
AST: 32 IU/L (ref 0–40)
Albumin/Globulin Ratio: 1.6 (ref 1.2–2.2)
Albumin: 4.7 g/dL (ref 3.7–4.7)
Alkaline Phosphatase: 74 IU/L (ref 39–117)
BUN/Creatinine Ratio: 21 (ref 12–28)
BUN: 21 mg/dL (ref 8–27)
Bilirubin Total: 0.2 mg/dL (ref 0.0–1.2)
CO2: 27 mmol/L (ref 20–29)
Calcium: 9.9 mg/dL (ref 8.7–10.3)
Chloride: 99 mmol/L (ref 96–106)
Creatinine, Ser: 0.99 mg/dL (ref 0.57–1.00)
GFR calc Af Amer: 64 mL/min/{1.73_m2} (ref >59)
GFR calc non Af Amer: 56 mL/min/{1.73_m2} — ABNORMAL LOW (ref >59)
Globulin, Total: 3 g/dL (ref 1.5–4.5)
Glucose: 156 mg/dL — ABNORMAL HIGH (ref 65–99)
Potassium: 4.1 mmol/L (ref 3.5–5.2)
Sodium: 139 mmol/L (ref 134–144)
Total Protein: 7.7 g/dL (ref 6.0–8.5)

## 2019-03-29 LAB — LIPID PANEL
Chol/HDL Ratio: 3.5 ratio (ref 0.0–4.4)
Cholesterol, Total: 152 mg/dL (ref 100–199)
HDL: 44 mg/dL (ref >39)
LDL Chol Calc (NIH): 78 mg/dL (ref 0–99)
Triglycerides: 178 mg/dL — ABNORMAL HIGH (ref 0–149)
VLDL Cholesterol Cal: 30 mg/dL (ref 5–40)

## 2019-03-29 LAB — HEMOGLOBIN A1C
Est. average glucose Bld gHb Est-mCnc: 169 mg/dL
Hgb A1c MFr Bld: 7.5 % — ABNORMAL HIGH (ref 4.8–5.6)

## 2019-03-29 LAB — TSH+FREE T4
Free T4: 0.89 ng/dL (ref 0.82–1.77)
TSH: 1 u[IU]/mL (ref 0.450–4.500)

## 2019-03-29 LAB — VITAMIN D 25 HYDROXY (VIT D DEFICIENCY, FRACTURES): Vit D, 25-Hydroxy: 32.1 ng/mL (ref 30.0–100.0)

## 2019-04-04 ENCOUNTER — Other Ambulatory Visit: Payer: Self-pay

## 2019-04-04 ENCOUNTER — Ambulatory Visit (HOSPITAL_COMMUNITY)
Admission: RE | Admit: 2019-04-04 | Discharge: 2019-04-04 | Disposition: A | Discharge status: Home / Self Care | Payer: PRIVATE HEALTH INSURANCE | Source: Ambulatory Visit | Attending: Medical | Admitting: Medical

## 2019-04-04 DIAGNOSIS — Z794 Long term (current) use of insulin: Secondary | ICD-10-CM | POA: Diagnosis present

## 2019-04-04 DIAGNOSIS — Z0000 Encounter for general adult medical examination without abnormal findings: Secondary | ICD-10-CM | POA: Diagnosis present

## 2019-04-04 DIAGNOSIS — E119 Type 2 diabetes mellitus without complications: Secondary | ICD-10-CM | POA: Insufficient documentation

## 2019-04-05 ENCOUNTER — Other Ambulatory Visit: Payer: Self-pay | Admitting: Medical

## 2019-04-06 ENCOUNTER — Other Ambulatory Visit: Payer: Self-pay | Admitting: Medical

## 2019-04-11 ENCOUNTER — Other Ambulatory Visit: Payer: Self-pay | Admitting: Medical

## 2019-04-11 DIAGNOSIS — I10 Essential (primary) hypertension: Secondary | ICD-10-CM

## 2019-04-11 DIAGNOSIS — R6889 Other general symptoms and signs: Secondary | ICD-10-CM

## 2019-04-11 DIAGNOSIS — I2510 Atherosclerotic heart disease of native coronary artery without angina pectoris: Secondary | ICD-10-CM

## 2019-04-11 DIAGNOSIS — E119 Type 2 diabetes mellitus without complications: Secondary | ICD-10-CM

## 2019-04-11 MED ORDER — ASPIRIN EC 81 MG PO TBEC: 81.0000 mg | DELAYED_RELEASE_TABLET | Freq: Every day | ORAL | 3 refills | Status: DC

## 2019-04-11 MED ORDER — ATORVASTATIN CALCIUM 80 MG PO TABS: 80.0000 mg | ORAL_TABLET | Freq: Every day | ORAL | 0 refills | Status: DC

## 2019-04-11 MED ORDER — RAMIPRIL 10 MG PO CAPS: ORAL_CAPSULE | ORAL | 0 refills | Status: DC

## 2019-04-11 MED ORDER — ALBUTEROL SULFATE HFA 108 (90 BASE) MCG/ACT IN AERS: INHALATION_SPRAY | RESPIRATORY_TRACT | 1 refills | Status: DC

## 2019-04-11 MED ORDER — AMLODIPINE BESYLATE 5 MG PO TABS: 5.0000 mg | ORAL_TABLET | Freq: Every day | ORAL | 0 refills | Status: DC

## 2019-04-11 MED ORDER — HYDROCHLOROTHIAZIDE 25 MG PO TABS: 25.0000 mg | ORAL_TABLET | Freq: Every day | ORAL | 0 refills | Status: DC

## 2019-04-16 NOTE — Progress Notes (Deleted)
Cardiology Office Note   Date:  04/16/2019   ID:  Shelly Blevins, DOB 04/07/1945, MRN [id number]  PCP:  Carlena Hurl, PA-C  Cardiologist:  Pixie Casino, MD EP: None  No chief complaint on file.     History of Present Illness: Shelly Blevins is a 74 y.o. female with a PMH of non-obstructive CAD (moderate LCx disease), HTN, HLD, DM type 2 on insulin, hypothyroidism, and CVA, who presents for ***  She was last evaluated by cardiology at an outpatient visit with Dr. Debara Pickett 02/15/2019, at which time she reported chronic DOE which was unchanged, otherwise no cardiac complaints. She was started on jardiance in addition to her insulin and janumet for management of her DM and heart disease. Her last ischemic evaluation was a NST 03/2018 which showed EF 55-65% and no ischemia or infarction. She had a LHC in 2008 which showed 50% pLCx stenosis. She underwent ABI testing 04/04/19 which suggested mild bilateral LE arterial disease.  She presents today for ***  1. CAD: moderate non-obstructive LCx disease on LHC in 2008. Negative stress test 03/2018. No anginal complaints - Continue aspirin and statin - Continue aggressive risk factor modifications below  2. PAD: mild b/l LE disease noted on ABI's 04/04/19.  - Continue aspirin and statin  3. HTN: BP *** today; goal <130/80 - Continue amlodipine, HCTZ, and ramapril  4. HLD: LDL 78 02/2019; goal <70 - Continue atorvastatin 80mg  daily - Consider starting zetia  5. DM type 2: A1C 7.5 02/2019; goal <7 - Continue insulin, jardiance, and janumet    Past Medical History:  Diagnosis Date  . Allergy   . Arthritis   . CAD (coronary artery disease)    mild nonobstructive disease, 30% Cfx lesion  . Cataract   . Diabetes mellitus    insulin-dependent; Dr. Chalmers Cater  . Dyslipidemia   . GERD (gastroesophageal reflux disease)   . Hyperlipidemia   . Hypertension   . Hypothyroidism    Dr. Chalmers Cater  . Stroke [facility])     Past Surgical History:    Procedure Laterality Date  . ABDOMINAL HYSTERECTOMY    . CARDIAC CATHETERIZATION  02/17/2007   mild CAD with 50% prox L Cfx stenosis, normal LV function (Dr. Norlene Duel)  . CHOLECYSTECTOMY    . EYE SURGERY    . NM MYOCAR PERF WALL MOTION  2009   lexiscan myoview - perfusion defect in anterior myociaral region (breast attenuation) - low risk scan - EF 69%  . THYROIDECTOMY    . TRANSTHORACIC ECHOCARDIOGRAM  2008   normal study      Current Outpatient Medications  Medication Sig Dispense Refill  . albuterol (VENTOLIN HFA) 108 (90 Base) MCG/ACT inhaler TAKE 2 PUFFS BY MOUTH EVERY 6 HOURS AS NEEDED FOR WHEEZE OR SHORTNESS OF BREATH 18 g 1  . amLODipine (NORVASC) 5 MG tablet Take 1 tablet (5 mg total) by mouth daily. 90 tablet 0  . aspirin EC 81 MG tablet Take 1 tablet (81 mg total) by mouth daily. 90 tablet 3  . atorvastatin (LIPITOR) 80 MG tablet Take 1 tablet (80 mg total) by mouth daily. 90 tablet 0  . BD PEN NEEDLE NANO 2ND GEN 32G X 4 MM MISC 2 (two) times daily. as directed    . cholecalciferol (VITAMIN D3) 25 MCG (1000 UT) tablet TAKE 1 TABLET BY MOUTH EVERY DAY 90 tablet 3  . empagliflozin (JARDIANCE) 10 MG TABS tablet Take 10 mg by mouth daily before breakfast.  30 tablet 5  . hydrochlorothiazide (HYDRODIURIL) 25 MG tablet Take 1 tablet (25 mg total) by mouth daily. 90 tablet 0  . ibuprofen (ADVIL,MOTRIN) 200 MG tablet Take 400 mg by mouth every 6 (six) hours as needed for headache or moderate pain.    Marland Kitchen latanoprost (XALATAN) 0.005 % ophthalmic solution Place 1 drop into both eyes at bedtime.  5  . levothyroxine (SYNTHROID, LEVOTHROID) 50 MCG tablet Take 50 mcg by mouth daily before breakfast.    . lidocaine (XYLOCAINE) 5 % ointment Apply 1 application topically as needed for mild pain.    Marland Kitchen NOVOLOG MIX 70/30 FLEXPEN (70-30) 100 UNIT/ML FlexPen Inject 60 Units into the skin 2 (two) times daily.   3  . ramipril (ALTACE) 10 MG capsule TAKE 1 CAPSULE BY MOUTH EVERY DAY 90 capsule 0  .  SitaGLIPtin-MetFORMIN HCl (JANUMET XR) 50-500 MG TB24 Take 1 tablet by mouth 2 (two) times daily.     No current facility-administered medications for this visit.    Allergies:   Patient has no known allergies.    Social History:  The patient  reports that she quit smoking about 33 years ago. She has never used smokeless tobacco. She reports that she does not drink alcohol or use drugs.   Family History:  The patient's ***family history includes Diabetes in her mother; Heart failure in her brother and mother; Sudden death in her sister.    ROS:  Please see the history of present illness.   Otherwise, review of systems are positive for {NONE DEFAULTED:18576::"none"}.   All other systems are reviewed and negative.    PHYSICAL EXAM: VS:  There were no vitals taken for this visit. , BMI There is no height or weight on file to calculate BMI. GEN: Well nourished, well developed, in no acute distress HEENT: normal Neck: no JVD, carotid bruits, or masses Cardiac: ***RRR; no murmurs, rubs, or gallops,no edema  Respiratory:  clear to auscultation bilaterally, normal work of breathing GI: soft, nontender, nondistended, + BS MS: no deformity or atrophy Skin: warm and dry, no rash Neuro:  Strength and sensation are intact Psych: euthymic mood, full affect   EKG:  EKG {ACTION; IS/IS [id number] ordered today. The ekg ordered today demonstrates ***   Recent Labs: 03/28/2019: ALT 31; BUN 21; Creatinine, Ser 0.99; Hemoglobin 12.2; Platelets 233; Potassium 4.1; Sodium 139; TSH 1.000    Lipid Panel    Component Value Date/Time   CHOL 152 03/28/2019 1025   TRIG 178 (H) 03/28/2019 1025   HDL 44 03/28/2019 1025   CHOLHDL 3.5 03/28/2019 1025   LDLCALC 78 03/28/2019 1025      Wt Readings from Last 3 Encounters:  03/28/19 173 lb (78.5 kg)  02/15/19 177 lb 6.4 oz (80.5 kg)  01/09/19 178 lb 12.8 oz (81.1 kg)      Other studies Reviewed: Additional studies/ records that were reviewed  today include:   NST 03/2018:  The left ventricular ejection fraction is normal (55-65%).  Nuclear stress EF: 61%.  There was no ST segment deviation noted during stress.  The study is normal.  This is a low risk study.   Normal stress nuclear study with no ischemia or infarction.  Gated ejection fraction 61% with normal wall motion.  ABI's 04/04/19:  Summary: Right: Resting right ankle-brachial index indicates mild right lower extremity arterial disease.  Left: Resting left ankle-brachial index indicates mild left lower extremity arterial disease.   ASSESSMENT AND PLAN:  1.  ***  Current medicines are reviewed at length with the patient today.  The patient {ACTIONS; HAS/DOES NOT HAVE:19233} concerns regarding medicines.  The following changes have been made:  {PLAN; NO CHANGE:13088:s}  Labs/ tests ordered today include: *** No orders of the defined types were placed in this encounter.    Disposition:   FU with *** in {gen number [id number] {Days to years:10300}  Signed, Abigail Butts, PA-C  04/16/2019 1:07 PM

## 2019-04-17 ENCOUNTER — Ambulatory Visit: Payer: PRIVATE HEALTH INSURANCE | Admitting: Medical

## 2019-04-19 ENCOUNTER — Encounter: Payer: Self-pay | Admitting: Medical

## 2019-04-19 ENCOUNTER — Ambulatory Visit (INDEPENDENT_AMBULATORY_CARE_PROVIDER_SITE_OTHER): Payer: PRIVATE HEALTH INSURANCE | Admitting: Medical

## 2019-04-19 ENCOUNTER — Other Ambulatory Visit: Payer: Self-pay

## 2019-04-19 VITALS — BP 138/72 | HR 68 | Temp 95.7°F | Wt 174.6 lb

## 2019-04-19 DIAGNOSIS — E785 Hyperlipidemia, unspecified: Secondary | ICD-10-CM | POA: Diagnosis not present

## 2019-04-19 DIAGNOSIS — I2510 Atherosclerotic heart disease of native coronary artery without angina pectoris: Secondary | ICD-10-CM | POA: Diagnosis not present

## 2019-04-19 DIAGNOSIS — I10 Essential (primary) hypertension: Secondary | ICD-10-CM

## 2019-04-19 DIAGNOSIS — E1165 Type 2 diabetes mellitus with hyperglycemia: Secondary | ICD-10-CM | POA: Diagnosis not present

## 2019-04-19 DIAGNOSIS — I2583 Coronary atherosclerosis due to lipid rich plaque: Secondary | ICD-10-CM

## 2019-04-19 DIAGNOSIS — I739 Peripheral vascular disease, unspecified: Secondary | ICD-10-CM

## 2019-04-19 MED ORDER — AMLODIPINE BESYLATE 10 MG PO TABS: 10.0000 mg | ORAL_TABLET | Freq: Every day | ORAL | 3 refills | Status: DC

## 2019-04-19 MED ORDER — EZETIMIBE 10 MG PO TABS: 10.0000 mg | ORAL_TABLET | Freq: Every day | ORAL | 3 refills | Status: DC

## 2019-04-19 NOTE — Patient Instructions (Signed)
Medication Instructions:   INCREASE AMLODIPINE TO 10 MG DAILY  START ZETIA 10 MG DAILY *If you need a refill on your cardiac medications before your next appointment, please call your pharmacy*  Lab Work: NONE ordered at this time of appointment   If you have labs (blood work) drawn today and your tests are completely normal, you will receive your results only by: Marland Kitchen MyChart Message (if you have MyChart) OR . A paper copy in the mail If you have any lab test that is abnormal or we need to change your treatment, we will call you to review the results.  Testing/Procedures: NONE ordered at this time of appointment   Follow-Up: At [facility], you and your health needs are our priority.  As part of our continuing mission to provide you with exceptional heart care, we have created designated Provider Care Teams.  These Care Teams include your primary Cardiologist (physician) and Advanced Practice Providers (APPs -  Physician Assistants and Nurse Practitioners) who all work together to provide you with the care you need, when you need it.  Your next appointment:   6 month(s)  The format for your next appointment:   In Person  Provider:   K. Mali Hilty, MD  Other Instructions

## 2019-04-19 NOTE — Progress Notes (Signed)
Cardiology Office Note   Date:  04/19/2019   ID:  Cyan, Barbosa 06-03-1945, MRN [id number]  PCP:  Carlena Hurl, PA-C  Cardiologist:  Pixie Casino, MD EP: None  Chief Complaint  Patient presents with  . Follow-up    CAD, HTN      History of Present Illness: Shelly Blevins is a 74 y.o. female with a PMH of non-obstructive CAD (moderate LCx stenosis on LHC 2008), HTN, HLD, DM type 2 on insulin, CVA, hypothyroidism who presents for routine follow-up.   She was last evaluated by cardiology at an outpatient visit with Dr. Debara Pickett 02/15/2019, at which time she had improvement in her fatigue and chest tightness, though recent HgbA1C showed poor DM control. She was started on jardiance and enrolled in Coordinate-Diabetes trial. She was recommended to follow-up in 6 months. Since that visit she had complaints of b/l LE pain and decreased pulses. ABI's suggested mild bilateral LE arterial disease.   Her last ischemic evaluation was a NST 03/2018 which showed EF 55-65% and no ischemia or infarction. LHC in 2008 showed moderate LCx disease, otherwise normal coronaries. Her last echo in 2008 showed EF 62%, G1DD, and no significant valvular abnormalities.   She has been doing fine from a cardiac standpoint. She retired from her housekeeping job a couple weeks ago. She reports her nephew recently died from CHF and she wants to make sure she's being aggressive with her health to prevent complications. She denies chest pain, SOB, DOE, LE edema, dizziness, lightheadedness, syncope, or palpitations. We discussed increasing physical activity with goal of 30 minutes of activity per day.      Past Medical History:  Diagnosis Date  . Allergy   . Arthritis   . CAD (coronary artery disease)    mild nonobstructive disease, 30% Cfx lesion  . Cataract   . Diabetes mellitus    insulin-dependent; Dr. Chalmers Cater  . Dyslipidemia   . GERD (gastroesophageal reflux disease)   . Hyperlipidemia   .  Hypertension   . Hypothyroidism    Dr. Chalmers Cater  . Stroke [facility])     Past Surgical History:  Procedure Laterality Date  . ABDOMINAL HYSTERECTOMY    . CARDIAC CATHETERIZATION  02/17/2007   mild CAD with 50% prox L Cfx stenosis, normal LV function (Dr. Norlene Duel)  . CHOLECYSTECTOMY    . EYE SURGERY    . NM MYOCAR PERF WALL MOTION  2009   lexiscan myoview - perfusion defect in anterior myociaral region (breast attenuation) - low risk scan - EF 69%  . THYROIDECTOMY    . TRANSTHORACIC ECHOCARDIOGRAM  2008   normal study      Current Outpatient Medications  Medication Sig Dispense Refill  . albuterol (VENTOLIN HFA) 108 (90 Base) MCG/ACT inhaler TAKE 2 PUFFS BY MOUTH EVERY 6 HOURS AS NEEDED FOR WHEEZE OR SHORTNESS OF BREATH 18 g 1  . amLODipine (NORVASC) 10 MG tablet Take 1 tablet (10 mg total) by mouth daily. 90 tablet 3  . aspirin EC 81 MG tablet Take 1 tablet (81 mg total) by mouth daily. 90 tablet 3  . atorvastatin (LIPITOR) 80 MG tablet Take 1 tablet (80 mg total) by mouth daily. 90 tablet 0  . BD PEN NEEDLE NANO 2ND GEN 32G X 4 MM MISC 2 (two) times daily. as directed    . cholecalciferol (VITAMIN D3) 25 MCG (1000 UT) tablet TAKE 1 TABLET BY MOUTH EVERY DAY 90 tablet 3  . empagliflozin (JARDIANCE)  10 MG TABS tablet Take 10 mg by mouth daily before breakfast. 30 tablet 5  . hydrochlorothiazide (HYDRODIURIL) 25 MG tablet Take 1 tablet (25 mg total) by mouth daily. 90 tablet 0  . ibuprofen (ADVIL,MOTRIN) 200 MG tablet Take 400 mg by mouth every 6 (six) hours as needed for headache or moderate pain.    Marland Kitchen latanoprost (XALATAN) 0.005 % ophthalmic solution Place 1 drop into both eyes at bedtime.  5  . levothyroxine (SYNTHROID, LEVOTHROID) 50 MCG tablet Take 50 mcg by mouth daily before breakfast.    . lidocaine (XYLOCAINE) 5 % ointment Apply 1 application topically as needed for mild pain.    Marland Kitchen NOVOLOG MIX 70/30 FLEXPEN (70-30) 100 UNIT/ML FlexPen Inject 60 Units into the skin 2 (two) times  daily.   3  . ramipril (ALTACE) 10 MG capsule TAKE 1 CAPSULE BY MOUTH EVERY DAY 90 capsule 0  . SitaGLIPtin-MetFORMIN HCl (JANUMET XR) 50-500 MG TB24 Take 1 tablet by mouth 2 (two) times daily.    Marland Kitchen ezetimibe (ZETIA) 10 MG tablet Take 1 tablet (10 mg total) by mouth daily. 90 tablet 3   No current facility-administered medications for this visit.    Allergies:   Patient has no known allergies.    Social History:  The patient  reports that she quit smoking about 33 years ago. She has never used smokeless tobacco. She reports that she does not drink alcohol or use drugs.   Family History:  The patient's family history includes Diabetes in her mother; Heart failure in her brother and mother; Sudden death in her sister.    ROS:  Please see the history of present illness.   Otherwise, review of systems are positive for none.   All other systems are reviewed and negative.    PHYSICAL EXAM: VS:  BP 138/72   Pulse 68   Temp (!) 95.7 F (35.4 C)   Wt 174 lb 9.6 oz (79.2 kg)   SpO2 98%   BMI 30.93 kg/m  , BMI Body mass index is 30.93 kg/m. GEN: Well nourished, well developed, in no acute distress HEENT: sclera anicteric Neck: no JVD, carotid bruits, or masses Cardiac: RRR; no murmurs, rubs, or gallops, no edema  Respiratory:  clear to auscultation bilaterally, normal work of breathing GI: soft, nontender, nondistended, + BS MS: no deformity or atrophy Skin: warm and dry, no rash Neuro:  Strength and sensation are intact Psych: euthymic mood, full affect   EKG:  EKG is not ordered today.   Recent Labs: 03/28/2019: ALT 31; BUN 21; Creatinine, Ser 0.99; Hemoglobin 12.2; Platelets 233; Potassium 4.1; Sodium 139; TSH 1.000    Lipid Panel    Component Value Date/Time   CHOL 152 03/28/2019 1025   TRIG 178 (H) 03/28/2019 1025   HDL 44 03/28/2019 1025   CHOLHDL 3.5 03/28/2019 1025   LDLCALC 78 03/28/2019 1025      Wt Readings from Last 3 Encounters:  04/19/19 174 lb 9.6 oz  (79.2 kg)  03/28/19 173 lb (78.5 kg)  02/15/19 177 lb 6.4 oz (80.5 kg)      Other studies Reviewed: Additional studies/ records that were reviewed today include:   NST 03/2018:  The left ventricular ejection fraction is normal (55-65%).  Nuclear stress EF: 61%.  There was no ST segment deviation noted during stress.  The study is normal.  This is a low risk study.   Normal stress nuclear study with no ischemia or infarction.  Gated ejection fraction 61%  with normal wall motion.    ASSESSMENT AND PLAN:  1. Non-obstructive CAD: no anginal complaints. - Continue aspirin and statin  2. HTN: BP 138/72 today - Will increase amlodipine to 10mg  daily in an effort to get BP to goal.  - Continue HCTZ, and ramipril  3. HLD: LDL 78 02/2019; goal <70; triglycerides 178 - Continue atrovastatin - Will start zetia in an effort to reach goal LDL <70  4. DM type 2: A1C improved from 8.3 to 7.5 after starting jardiance.  - Continue jardiance, janumet, and insulin  5. PAD: ABIs 04/04/19 suggestive of mild bilateral LE arterial disease - Continue aspirin and statin   Current medicines are reviewed at length with the patient today.  The patient does not have concerns regarding medicines.  The following changes have been made:  As above  Labs/ tests ordered today include:  No orders of the defined types were placed in this encounter.    Disposition:   FU with Dr. Debara Pickett in 6 months  Signed, Abigail Butts, PA-C  04/19/2019 12:09 PM

## 2019-05-08 ENCOUNTER — Telehealth (HOSPITAL_COMMUNITY): Payer: Self-pay

## 2019-05-08 NOTE — Telephone Encounter (Signed)

## 2019-05-09 ENCOUNTER — Encounter: Payer: Self-pay | Admitting: Vascular Surgery

## 2019-05-09 ENCOUNTER — Ambulatory Visit (INDEPENDENT_AMBULATORY_CARE_PROVIDER_SITE_OTHER): Payer: PRIVATE HEALTH INSURANCE | Admitting: Vascular Surgery

## 2019-05-09 ENCOUNTER — Other Ambulatory Visit: Payer: Self-pay

## 2019-05-09 VITALS — BP 112/65 | HR 63 | Temp 97.5°F | Resp 20 | Ht 63.0 in | Wt 174.8 lb

## 2019-05-09 DIAGNOSIS — I739 Peripheral vascular disease, unspecified: Secondary | ICD-10-CM

## 2019-05-09 NOTE — Progress Notes (Signed)
[facility]  Patient name: Shelly Blevins MRN: [id number] DOB: 1945-06-15 Sex: female  REASON FOR CONSULT: Valuation left leg discomfort  HPI: Shelly Blevins is a 74 y.o. female, who is here today for evaluation.  She is very pleasant active 74 year old.  Her complaint is of discomfort in her left pretibial region.  She reports that this is most noted when she is sitting for an extended period of time and when she first arises has discomfort in the pretibial area.  On questioning claudication symptoms, she specifically denies any difficulty with calf discomfort or tightening if she does a great deal of walking.  She has no history of lower extremity tissue loss.  Past Medical History:  Diagnosis Date  . Allergy   . Arthritis   . CAD (coronary artery disease)    mild nonobstructive disease, 30% Cfx lesion  . Cataract   . Diabetes mellitus    insulin-dependent; Dr. Chalmers Cater  . Dyslipidemia   . GERD (gastroesophageal reflux disease)   . Hyperlipidemia   . Hypertension   . Hypothyroidism    Dr. Chalmers Cater  . Stroke [facility])     Family History  Problem Relation Age of Onset  . Heart failure Mother   . Diabetes Mother   . Sudden death Sister        died at 16  . Heart failure Brother        died at 60  . Colon cancer Neg Hx   . Esophageal cancer Neg Hx   . Stomach cancer Neg Hx   . Rectal cancer Neg Hx     SOCIAL HISTORY: Social History   Socioeconomic History  . Marital status: Married    Spouse name: Not on file  . Number of children: 2  . Years of education: 10  . Highest education level: Not on file  Occupational History    Employer: [employer]  Tobacco Use  . Smoking status: Former Smoker    Quit date: 04/02/1986    Years since quitting: 33.1  . Smokeless tobacco: Never Used  Substance and Sexual Activity  . Alcohol use: No  . Drug use: No  . Sexual activity: Not on file  Other Topics Concern   . Not on file  Social History Narrative  . Not on file   Social Determinants of Health   Financial Resource Strain:   . Difficulty of Paying Living Expenses: Not on file  Food Insecurity:   . Worried About Charity fundraiser in the Last Year: Not on file  . Ran Out of Food in the Last Year: Not on file  Transportation Needs:   . Lack of Transportation (Medical): Not on file  . Lack of Transportation (Non-Medical): Not on file  Physical Activity:   . Days of Exercise per Week: Not on file  . Minutes of Exercise per Session: Not on file  Stress:   . Feeling of Stress : Not on file  Social Connections:   . Frequency of Communication with Friends and Family: Not on file  . Frequency of Social Gatherings with Friends and Family: Not on file  . Attends Religious Services: Not on file  . Active Member of Clubs or Organizations: Not on file  . Attends Archivist Meetings: Not on file  . Marital Status: Not on file  Intimate Partner Violence:   . Fear of Current or Ex-Partner: Not on file  . Emotionally Abused: Not  on file  . Physically Abused: Not on file  . Sexually Abused: Not on file    No Known Allergies  Current Outpatient Medications  Medication Sig Dispense Refill  . albuterol (VENTOLIN HFA) 108 (90 Base) MCG/ACT inhaler TAKE 2 PUFFS BY MOUTH EVERY 6 HOURS AS NEEDED FOR WHEEZE OR SHORTNESS OF BREATH 18 g 1  . amLODipine (NORVASC) 10 MG tablet Take 1 tablet (10 mg total) by mouth daily. 90 tablet 3  . aspirin EC 81 MG tablet Take 1 tablet (81 mg total) by mouth daily. 90 tablet 3  . atorvastatin (LIPITOR) 80 MG tablet Take 1 tablet (80 mg total) by mouth daily. 90 tablet 0  . BD PEN NEEDLE NANO 2ND GEN 32G X 4 MM MISC 2 (two) times daily. as directed    . cholecalciferol (VITAMIN D3) 25 MCG (1000 UT) tablet TAKE 1 TABLET BY MOUTH EVERY DAY 90 tablet 3  . empagliflozin (JARDIANCE) 10 MG TABS tablet Take 10 mg by mouth daily before breakfast. 30 tablet 5  .  ezetimibe (ZETIA) 10 MG tablet Take 1 tablet (10 mg total) by mouth daily. 90 tablet 3  . hydrochlorothiazide (HYDRODIURIL) 25 MG tablet Take 1 tablet (25 mg total) by mouth daily. 90 tablet 0  . ibuprofen (ADVIL,MOTRIN) 200 MG tablet Take 400 mg by mouth every 6 (six) hours as needed for headache or moderate pain.    Marland Kitchen latanoprost (XALATAN) 0.005 % ophthalmic solution Place 1 drop into both eyes at bedtime.  5  . levothyroxine (SYNTHROID, LEVOTHROID) 50 MCG tablet Take 50 mcg by mouth daily before breakfast.    . lidocaine (XYLOCAINE) 5 % ointment Apply 1 application topically as needed for mild pain.    Marland Kitchen NOVOLOG MIX 70/30 FLEXPEN (70-30) 100 UNIT/ML FlexPen Inject 60 Units into the skin 2 (two) times daily.   3  . ramipril (ALTACE) 10 MG capsule TAKE 1 CAPSULE BY MOUTH EVERY DAY 90 capsule 0  . SitaGLIPtin-MetFORMIN HCl (JANUMET XR) 50-500 MG TB24 Take 1 tablet by mouth 2 (two) times daily.     No current facility-administered medications for this visit.    REVIEW OF SYSTEMS:  [X]  denotes positive finding, [ ]  denotes negative finding Cardiac  Comments:  Chest pain or chest pressure:    Shortness of breath upon exertion:    Short of breath when lying flat:    Irregular heart rhythm:        Vascular    Pain in calf, thigh, or hip brought on by ambulation:    Pain in feet at night that wakes you up from your sleep:     Blood clot in your veins:    Leg swelling:         Pulmonary    Oxygen at home:    Productive cough:     Wheezing:         Neurologic    Sudden weakness in arms or legs:     Sudden numbness in arms or legs:     Sudden onset of difficulty speaking or slurred speech:    Temporary loss of vision in one eye:     Problems with dizziness:  x       Gastrointestinal    Blood in stool:     Vomited blood:         Genitourinary    Burning when urinating:     Blood in urine:        Psychiatric    Major depression:  Hematologic    Bleeding problems:     Problems with blood clotting too easily:        Skin    Rashes or ulcers:        Constitutional    Fever or chills:      PHYSICAL EXAM: Vitals:   05/09/19 1021  BP: 112/65  Pulse: 63  Resp: 20  Temp: (!) 97.5 F (36.4 C)  SpO2: 98%  Weight: 174 lb 12.8 oz (79.3 kg)  Height: 5\' 3"  (1.6 m)    GENERAL: The patient is a well-nourished female, in no acute distress. The vital signs are documented above. CARDIOVASCULAR: Carotid arteries are without bruits bilaterally.  2+ radial pulses bilaterally 1-2+ dorsalis pedis pulses bilaterally PULMONARY: There is good air exchange  ABDOMEN: Soft and non-tender  MUSCULOSKELETAL: There are no major deformities or cyanosis. NEUROLOGIC: No focal weakness or paresthesias are detected. SKIN: There are no ulcers or rashes noted. PSYCHIATRIC: The patient has a normal affect.  DATA:  Noninvasive lower extremity studies from 04/04/2019 were reviewed.  This reveals a slightly diminished right ankle arm index 0.81 and left ankle arm index at 0.86.  She has biphasic waveforms bilaterally  MEDICAL ISSUES: I discussed these findings with the patient.  She does not have any calf claudication type symptoms and is asymptomatic from her mild lower extremity arterial disease.  I do not feel that she has any risk for critical limb ischemia.  She will continue her walking program and will see [place] again on an as-needed basis.  I do not see any indication for repeating her studies in the future unless she should have progressive symptoms   Rosetta Posner, MD [facility] Tel [phone] Pager [phone]

## 2019-06-01 ENCOUNTER — Other Ambulatory Visit: Payer: Self-pay | Admitting: Medical

## 2019-06-12 ENCOUNTER — Encounter: Payer: Self-pay | Admitting: Medical

## 2019-07-20 ENCOUNTER — Encounter: Payer: Self-pay | Admitting: *Deleted

## 2019-07-20 DIAGNOSIS — Z006 Encounter for examination for normal comparison and control in clinical research program: Secondary | ICD-10-CM

## 2019-07-20 NOTE — Research (Signed)
Marland KitchenMarland KitchenMarland KitchenMarland KitchenMarland KitchenMarland KitchenMarland Kitchen   COORDINATE-Diabetes 3 Month Patient Questionnaire (Intervention) Site #:   [id number]   Patient ID:        [id number]           3 MONTH QUESTIONNAIRE  Visit Date   Vital Status [x]   Patient Alive > Proceed to Visit Status []   Patient Dead > Complete Death Form only []   Unknown > Proceed to Visit Status  Visit Status Was the interview completed? []  No >IF NO, Select reason why [] Unable to locate                                    [] No Valid Contacts (patients or alternates) [] Multiple attempts to valid contacts []  Patient no longer cared for at study clinic []  Patient withdrew []  Other, specify:                                           >IF NO, Last date of contact: / /             MM      DD        YYYY    [] Yes >IF YES, Select source of Interview: []   Proxy []   Patient     3 Month Patient Questionnaire (Intervention)  Instructions:   1. Prior to speaking with the patient either over the phone or in person, print off or have available the patient's current list of medications.      IF conducting follow-up visit over the phone - Instruct the patient to gather all their pill bottles. 2. For the purpose of the COORDINATE-Diabetes Study, please document whether the patient is currently taking each of the following medication classes. 3. DO NOT PROMPT DURING FOLLOW-UP VISIT: IF subject mentions reaction to one of the following medications, complete the AE/SAE section and follow instructions in operations manual regarding Adverse event reporting to [employer] (BI).  MEDICATIONS  Medication Are you currently taking [insert name of previous med]? If currently taking: If started since last visit: If stopped since last visit:  ACE Inhibitor / Angiotensin Receptor Blocker (ARB) / Angiotensin Receptor Neprilysin inhibitor (ARNi) [x] No >  [] Yes > [] Stopped since last visit [] Never prescribed [] Started since last visit [] Same medication as last     visit [] Different medication  than       last visit [Study personnel to answer]  Documented in EHR? [] No  [] Yes   If no, Verified by: [] Photo of bottle [] Copy of rx [] Dispensing       pharmacy [] Prescribing provider [] Other (specify):                    Date started:        / /             MM DD YYYY Who prescribed this medication for you? [] Cardiology provider > [] Study clinic [] Outside clinic [] Endocrinology provider [] Primary care provider [] Other provider > Specify:                             [] Unknown Date discontinued:        / /             MM DD YYYY Why did you stop taking this medication? (check  all that apply) [] Allergic reaction [] Medication side effects [] Unable to       adhere/monitor [] Had an      operation/procedure      that required      stopping it [] Unable to afford it [] No longer wants        to take        this medication [] Provider decision [] Pregnancy [] Other (specify: ) [] Unknown Reason   If started or changed  What medication are you taking now? [] Benazepril (Lotensin) [] Captopril (Capoten) [] Enalapril (Vasotec) [] Fosinopril (Monopril) [] Lisinopril (Zestril, Prinivil) [] Quinapril (Accupril) [] Ramipril (Altace) [] Azilsartan (Edarbi) [] Candesartan (Atacand) [] Irbesartan (Avapro) [] Losartan (Cozaar) [] Olmesartan (Benicar) [] Telmisartan (Micardis) [] Valsartan (Diovan) [] Sacrubitril/Valsartan Delene Loll)      Medication Are you currently taking [insert name of previous med]? If currently taking: If started since last visit: If stopped since last visit:  Statin []  No >  [] Yes > [] Stopped since last visit [] Never prescribed [] Started since last visit [x] Same medication as last        visit [] Different medication or dose     than last visit [Study personnel to answer]  Documented in EHR? [] No [] Yes    If no, Verified by: [] Photo of bottle [] Copy of rx [] Dispensing pharmacy [] Prescribing provider [] Other (specify):                     Date started:        / /             MM DD YYYY Who prescribed this medication for you? [] Cardiology provider  [] Study clinic [] Outside clinic [] Endocrinology provider [] Primary care provider [] Other provider  Specify:                             [] Unknown Date discontinued:        / /             MM DD YYYY Why did you stop taking this medication? (check all that apply) [] Allergic reaction [] Medication side effects  [] Muscle aches [] Weakness [] Joint pain [] Cognitive symptoms [] Other (specify):                          [] Unable to        adhere/monitor [] Had an    operation/procedure    that required stopping it [] Unable to afford it [] No longer wants       to take this            medication [] Provider decision [] Pregnancy [] Other (specify: ) [] Unknown Reason   If started or changed  What medication are you taking now? [] Atorvastatin (Lipitor) [] Fluvastatin (Lescol) [] Lovastatin (Mevacor) [] Pravastatin (Pravachol) [] Rosuvastatin (Crestor) [] Simvastatin (Zocor) [] Pitatavastatin (Livalo)  Dose: [] 1 mg  [] 10 mg [] 2 mg  []  20 mg [] 3 mg  []  40 mg [] 4 mg  []  60 mg [] 5 mg  []  80 mg  Frequency: [] Daily []  Less than daily      Medication Are you currently taking [insert name of previous med]? If currently taking: If started since last visit: If stopped since last visit:  SGLT2 Inhibitor [] No   [x] Yes [] Stopped since last visit [] Never prescribed [] Started since last visit [x] Same medication as last      visit [] Different medication than last visit [Study personnel to answer]  Documented in EHR? [] No [] Yes   If no, Verified by: [] Photo of bottle [] Copy of rx [] Dispensing  pharmacy [] Prescribing       provider [] Other (specify):                    Date started:        / /             MM DD YYYY Who prescribed this medication for you? [] Cardiology provider  [] Study clinic [] Outside clinic [] Endocrinology provider [] Primary care  provider [] Other provider  Specify:                             [] Unknown Date discontinued:        / /             MM DD YYYY Why did you stop taking this medication? (check all that apply) [] Allergic reaction [] Medication side effects [] Unable to       adhere/monitor [] Had an        operation/procedure         that required           stopping it [] Patient unable to       afford it [] Patient no longer       wants to       take this medication [] Provider decision [] Pregnancy [] Other (specify: ) [] Unknown Reason   If started or changed  What medication are you taking now? [] Canaglifozin (Invokana) [] Dapagliflozin (Farxiga) [x] Empaglifozin (Jardiance) [] Ertugliflozin (Steglatro)     GLP1 Receptor Agonist [x] No   [] Yes [] Stopped since last visit [] Never prescribed [] Started since last visit [] Same medication as last      visit [] Different medication than       last visit [Study personnel to answer]  Documented in EHR? [] No [] Yes   If no, Verified by: [] Photo of bottle [] Copy of rx [] Dispensing       pharmacy [] Prescribing       provider [] Other (specify):                    Date started:        / /             MM DD YYYY Who prescribed this medication for you? [] Cardiology provider  [] Study clinic [] Outside clinic [] Endocrinology provider [] Primary care provider [] Other provider  Specify:                             [] Unknown Date discontinued:        / /             MM DD YYYY Why did you stop taking this medication? (check all that apply) [] Allergic reaction [] Medication side effects [] Unable to      adhere/monitor [] Had an    operation/procedure    that required stopping it [] Patient unable to        afford it [] Patient no longer        wants to take this        medication [] Provider decision [] Pregnancy [] Other (specify: ) [] Unknown Reason   If started or changed  What medication are you taking now? [] Albiglutide  (Tanzeum) [] Dulaglutide (Trulicity) [] Exanatide (Byetta, Bydureon) [] Liraglutide (Victoza, Saxenda) [] Lixisenatide (Adlyxin) [] Semaglutice (Ozempic)     06/07/2018 ([facility] Release)

## 2019-07-29 ENCOUNTER — Other Ambulatory Visit: Payer: Self-pay | Admitting: Medical

## 2019-08-14 ENCOUNTER — Encounter: Payer: Self-pay | Admitting: Internal Medicine

## 2019-08-14 ENCOUNTER — Ambulatory Visit (INDEPENDENT_AMBULATORY_CARE_PROVIDER_SITE_OTHER): Payer: Medicare Other | Admitting: Internal Medicine

## 2019-08-14 ENCOUNTER — Other Ambulatory Visit: Payer: Self-pay

## 2019-08-14 VITALS — BP 146/64 | HR 63 | Ht 63.0 in | Wt 180.2 lb

## 2019-08-14 DIAGNOSIS — I2583 Coronary atherosclerosis due to lipid rich plaque: Secondary | ICD-10-CM | POA: Diagnosis not present

## 2019-08-14 DIAGNOSIS — I10 Essential (primary) hypertension: Secondary | ICD-10-CM

## 2019-08-14 DIAGNOSIS — I2510 Atherosclerotic heart disease of native coronary artery without angina pectoris: Secondary | ICD-10-CM | POA: Diagnosis not present

## 2019-08-14 DIAGNOSIS — Z794 Long term (current) use of insulin: Secondary | ICD-10-CM

## 2019-08-14 DIAGNOSIS — E785 Hyperlipidemia, unspecified: Secondary | ICD-10-CM | POA: Diagnosis not present

## 2019-08-14 DIAGNOSIS — I739 Peripheral vascular disease, unspecified: Secondary | ICD-10-CM

## 2019-08-14 DIAGNOSIS — E119 Type 2 diabetes mellitus without complications: Secondary | ICD-10-CM | POA: Diagnosis not present

## 2019-08-14 NOTE — Patient Instructions (Signed)
Medication Instructions:  Your physician recommends that you continue on your current medications as directed. Please refer to the Current Medication list given to you today.  *If you need a refill on your cardiac medications before your next appointment, please call your pharmacy*   Lab Work: FASTING lipid panel & A1c  If you have labs (blood work) drawn today and your tests are completely normal, you will receive your results only by: Marland Kitchen MyChart Message (if you have MyChart) OR . A paper copy in the mail If you have any lab test that is abnormal or we need to change your treatment, we will call you to review the results.   Testing/Procedures: NONE   Follow-Up: At [facility], you and your health needs are our priority.  As part of our continuing mission to provide you with exceptional heart care, we have created designated Provider Care Teams.  These Care Teams include your primary Cardiologist (physician) and Advanced Practice Providers (APPs -  Physician Assistants and Nurse Practitioners) who all work together to provide you with the care you need, when you need it.  We recommend signing up for the patient portal called "MyChart".  Sign up information is provided on this After Visit Summary.  MyChart is used to connect with patients for Virtual Visits (Telemedicine).  Patients are able to view lab/test results, encounter notes, upcoming appointments, etc.  Non-urgent messages can be sent to your provider as well.   To learn more about what you can do with MyChart, go to [email or web address].    Your next appointment:   12 month(s)  The format for your next appointment:   Either In Person or Virtual  Provider:   You may see Pixie Casino, MD or one of the following Advanced Practice Providers on your designated Care Team:    Almyra Deforest, PA-C  Fabian Sharp, PA-C or   Roby Lofts, [place]    Other Instructions

## 2019-08-14 NOTE — Progress Notes (Signed)
OFFICE NOTE  Chief Complaint:  Follow-up  Primary Care Physician: Carlena Hurl, PA-C  HPI:  Shelly Blevins is a 74 year old female with a history of coronary artery disease with mild nonobstructive coronary disease and 50% (not 30% as previously reported) proximal circumflex lesion. She also has diabetes, insulin dependent, hypertension, and d chest yslipidemia. There is an unfortunate sudden-death history in her family with her sister dying at age 77 and a brother who died of heart failure at 61. Unfortunately, her husband recently died and he had a lot of medical problems in November of 2013. She has had problems with hypothyroidism and was off her thyroid medication for some time but that has been reestablished. Telemetry she is noted some increase in shortness of breath with activities. 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 She also is describing some discomfort in the left upper chest area into the left shoulder. Shelly is actually short-lived but sometimes is worse with exertion and relieved by rest. It is concerning to me for possible angina.  I saw Shelly Blevins back in the office today. Overall she is doing well. She denies any chest pain or worsening shortness of breath. At her last office visit she underwent a nuclear stress test which was negative for ischemia. Her diabetes is been fairly well controlled. She recently saw Dr. Chalmers Cater who is her endocrinologist. Blood pressure is at goal. Cholesterol is also followed by her endocrinologist.  Shelly Blevins returns today for follow-up. In general she is doing quite well. She denies any chest pain or worsening shortness of breath. She denies any palpitations. EKG appears stable. Diabetes is been fairly well controlled. She is on high-dose atorvastatin.  07/10/2016  Shelly Blevins returns today for follow-up. Overall she seems to be doing well. Recently she's had some recurrent lightheadedness and  dizziness. She cause of vertigo however it's not clear that that's the issue. She has had some low blood sugars and her insulin was decreased somewhat. Since then her symptoms may been improved somewhat. Blood pressures recently well-controlled today.  03/15/2018  Shelly Blevins is seen today in follow-up.  I asked her how she was feeling she said she is not feeling all that well.  She says at times she gets short of breath and fatigued really easily.  She does have a history of mild nonobstructive coronary disease by cath in the past however recently has had some worsening exertional symptoms.  She underwent stress testing last about 4 years ago which was negative for ischemia.  EKG today shows sinus rhythm with poor R wave progression and a rate of 62-personally reviewed  05/03/2018  Shelly Blevins returns today for follow-up of her stress test.  Shelly showed normal LV function with no reversible ischemia.  She reports she still gets short of breath with quick movements and significant exertion.  Shelly could be related to some degree of diastolic dysfunction.  Blood pressure is a little bit elevated today.  02/15/2019  Shelly Blevins is seen today in follow-up.  Overall she is doing well.  She denies any worsening shortness of breath or chest pain.  Her most recent hemoglobin A1c was 8.3.  She does have moderate coronary disease.  She is potentially good candidate for the coordinate diabetes study.  She is already on insulin as well as Janumet XR.  Despite Shelly, she has not been able to achieve hemoglobin A1c less than 7 and still  cannot seem to get her weight lower.  Blood pressure is well controlled today.   08/14/2019  Shelly Blevins is seen today in follow-up.  Overall she reports being under a lot of stress.  She has no specific complaints but occasionally gets some right-sided chest discomfort.  She says her diabetes is not been optimally controlled although her last A1c was 7.5.  She is followed closely by Dr.  Michiel Sites.  She has not had recent repeat lipids.  She was seen in January 2021 by Roby Lofts, PA-C who added ezetimibe.  She is not had repeat lipids since then.  PMHx:  Past Medical History:  Diagnosis Date  . Allergy   . Arthritis   . CAD (coronary artery disease)    mild nonobstructive disease, 30% Cfx lesion  . Cataract   . Diabetes mellitus    insulin-dependent; Dr. Chalmers Cater  . Dyslipidemia   . GERD (gastroesophageal reflux disease)   . Hyperlipidemia   . Hypertension   . Hypothyroidism    Dr. Chalmers Cater  . Stroke [facility])     Past Surgical History:  Procedure Laterality Date  . ABDOMINAL HYSTERECTOMY    . CARDIAC CATHETERIZATION  02/17/2007   mild CAD with 50% prox L Cfx stenosis, normal LV function (Dr. Norlene Duel)  . CHOLECYSTECTOMY    . EYE SURGERY    . NM MYOCAR PERF WALL MOTION  2009   lexiscan myoview - perfusion defect in anterior myociaral region (breast attenuation) - low risk scan - EF 69%  . THYROIDECTOMY    . TRANSTHORACIC ECHOCARDIOGRAM  2008   normal study     FAMHx:  Family History  Problem Relation Age of Onset  . Heart failure Mother   . Diabetes Mother   . Sudden death Sister        died at 34  . Heart failure Brother        died at 54  . Colon cancer Neg Hx   . Esophageal cancer Neg Hx   . Stomach cancer Neg Hx   . Rectal cancer Neg Hx     SOCHx:   reports that she quit smoking about 33 years ago. She has never used smokeless tobacco. She reports that she does not drink alcohol or use drugs.  ALLERGIES:  No Known Allergies  ROS: A comprehensive review of systems was negative.  HOME MEDS: Current Outpatient Medications  Medication Sig Dispense Refill  . albuterol (VENTOLIN HFA) 108 (90 Base) MCG/ACT inhaler TAKE 2 PUFFS BY MOUTH EVERY 6 HOURS AS NEEDED FOR WHEEZE OR SHORTNESS OF BREATH 18 g 1  . amLODipine (NORVASC) 10 MG tablet Take 1 tablet (10 mg total) by mouth daily. 90 tablet 3  . aspirin EC 81 MG tablet Take 1 tablet (81 mg total)  by mouth daily. 90 tablet 3  . atorvastatin (LIPITOR) 80 MG tablet Take 1 tablet (80 mg total) by mouth daily. 90 tablet 0  . BD PEN NEEDLE NANO 2ND GEN 32G X 4 MM MISC 2 (two) times daily. as directed    . cholecalciferol (VITAMIN D3) 25 MCG (1000 UT) tablet TAKE 1 TABLET BY MOUTH EVERY DAY 90 tablet 3  . empagliflozin (JARDIANCE) 10 MG TABS tablet Take 10 mg by mouth daily before breakfast. 30 tablet 5  . ezetimibe (ZETIA) 10 MG tablet Take 1 tablet (10 mg total) by mouth daily. 90 tablet 3  . hydrochlorothiazide (HYDRODIURIL) 25 MG tablet TAKE 1 TABLET BY MOUTH EVERY DAY 30 tablet 2  .  ibuprofen (ADVIL,MOTRIN) 200 MG tablet Take 400 mg by mouth every 6 (six) hours as needed for headache or moderate pain.    Marland Kitchen latanoprost (XALATAN) 0.005 % ophthalmic solution Place 1 drop into both eyes at bedtime.  5  . levothyroxine (SYNTHROID, LEVOTHROID) 50 MCG tablet Take 50 mcg by mouth daily before breakfast.    . lidocaine (XYLOCAINE) 5 % ointment Apply 1 application topically as needed for mild pain.    Marland Kitchen NOVOLOG MIX 70/30 FLEXPEN (70-30) 100 UNIT/ML FlexPen Inject 60 Units into the skin 2 (two) times daily.   3  . ramipril (ALTACE) 10 MG capsule TAKE 1 CAPSULE BY MOUTH EVERY DAY 90 capsule 0  . SitaGLIPtin-MetFORMIN HCl (JANUMET XR) 50-500 MG TB24 Take 1 tablet by mouth 2 (two) times daily.     No current facility-administered medications for Shelly visit.    LABS/IMAGING: No results found for Shelly or any previous visit (from the past 48 hour(s)). No results found.  VITALS: BP (!) 146/64   Pulse 63   Ht 5\' 3"  (1.6 m)   Wt 180 lb 3.2 oz (81.7 kg)   BMI 31.92 kg/m   EXAM: General appearance: alert and no distress Neck: no carotid bruit, no JVD and thyroid not enlarged, symmetric, no tenderness/mass/nodules Lungs: clear to auscultation bilaterally Heart: regular rate and rhythm, S1, S2 normal, no murmur, click, rub or gallop Abdomen: soft, non-tender; bowel sounds normal; no masses,  no  organomegaly Extremities: extremities normal, atraumatic, no cyanosis or edema Pulses: 2+ and symmetric Skin: Skin color, texture, turgor normal. No rashes or lesions Neurologic: Grossly normal Psych: Pleasant  EKG: Normal sinus rhythm 63, low voltage QRS-personally reviewed  ASSESSMENT: 1. History of fatigue, chest tightness and dyspnea -low risk Myoview stress test, normal LV function  (03/2018) 2. Moderate single vessel coronary disease with a 50% circumflex lesion and 2009 - negative nuclear stress test in 2015 3. nsulin-dependent diabetes, A1c 7.5 4. Hypertension  5. Dyslipidemia, goal LDL<70 6. Family history of premature sudden cardiac death Jun 30, 2022. Dyspnea/chest pain  PLAN: 1.   Shelly Blevins has been describing right-sided chest discomfort which is atypical.  Her A1c more recently was 7.5 after adding Jardiance and she is working with Dr. Michiel Sites.  We will plan to repeat lipid and A1c.  She also had the recent addition of ezetimibe.  At Shelly point I think she is probably on maximal medical therapy.  We will continue to monitor for any cardiac chest pain.  Follow-up with me annually or sooner as necessary.  Pixie Casino, MD, [facility], [place] Director of the Advanced Lipid Disorders &  Cardiovascular Risk Reduction Clinic Diplomate of the American Board of Clinical Lipidology Attending Cardiologist  Direct Dial: [phone]  Fax: [phone]  Website:  [email or web address]   Nadean Corwin Jefry Lesinski 08/14/2019, 2:22 PM

## 2019-08-15 ENCOUNTER — Other Ambulatory Visit: Payer: Self-pay | Admitting: Medical

## 2019-08-18 ENCOUNTER — Encounter: Payer: Self-pay | Admitting: Internal Medicine

## 2019-08-18 LAB — LIPID PANEL
Chol/HDL Ratio: 2.7 ratio (ref 0.0–4.4)
Cholesterol, Total: 107 mg/dL (ref 100–199)
HDL: 40 mg/dL (ref >39)
LDL Chol Calc (NIH): 48 mg/dL (ref 0–99)
Triglycerides: 104 mg/dL (ref 0–149)
VLDL Cholesterol Cal: 19 mg/dL (ref 5–40)

## 2019-08-18 LAB — HEMOGLOBIN A1C
Est. average glucose Bld gHb Est-mCnc: 186 mg/dL
Hgb A1c MFr Bld: 8.1 % — ABNORMAL HIGH (ref 4.8–5.6)

## 2019-09-05 ENCOUNTER — Other Ambulatory Visit: Payer: Self-pay | Admitting: Internal Medicine

## 2019-09-12 ENCOUNTER — Telehealth: Payer: Self-pay | Admitting: *Deleted

## 2019-09-12 NOTE — Telephone Encounter (Signed)
I called patient for 6 month Coordinate-Diabetes Study. Her medications have not changed.Hemoglobin A1c was 8. I encouraged patient to exercise. Patient states she watches diet.She has been under stress and has not felt well.

## 2019-10-26 ENCOUNTER — Encounter: Payer: Self-pay | Admitting: *Deleted

## 2019-10-26 ENCOUNTER — Telehealth: Payer: Self-pay | Admitting: *Deleted

## 2019-10-26 DIAGNOSIS — Z006 Encounter for examination for normal comparison and control in clinical research program: Secondary | ICD-10-CM

## 2019-10-26 NOTE — Telephone Encounter (Signed)
I left message for patient to call me for 1-month phone call for Coordinate-Diabetes Study.

## 2019-10-26 NOTE — Research (Signed)
Marland KitchenMarland KitchenMarland KitchenMarland KitchenMarland KitchenMarland KitchenMarland Kitchen   COORDINATE-Diabetes 46Month Patient Questionnaire (Intervention) Site #:   301   Patient ID:          [id number]       0 MONTH QUESTIONNAIRE  Visit Date 10/26/19   Vital Status [x]   Patient Alive > Proceed to Visit Status []   Patient Dead > Complete Death Form only []   Unknown > Proceed to Visit Status  Visit Status Was the interview completed? []  No >IF NO, Select reason why [] Unable to locate                                    [] No Valid Contacts (patients or alternates) [] Multiple attempts to valid contacts []  Patient no longer cared for at study clinic []  Patient withdrew []  Other, specify:                                           >IF NO, Last date of contact: / /             MM      DD        YYYY    [x] Yes >IF YES, Select source of Interview: []   Proxy [x]   Patient     9 Month Patient Questionnaire (Intervention)  Instructions:   1. Prior to speaking with the patient either over the phone or in person, print off or have available the patient's current list of medications.      IF conducting follow-up visit over the phone - Instruct the patient to gather all their pill bottles. 2. For the purpose of the COORDINATE-Diabetes Study, please document whether the patient is currently taking each of the following medication classes. 3. DO NOT PROMPT DURING FOLLOW-UP VISIT: IF subject mentions reaction to one of the following medications, complete the AE/SAE section and follow instructions in operations manual regarding Adverse event reporting to [employer] (BI).  MEDICATIONS  Medication Are you currently taking [insert name of previous med]? If currently taking: If started since last visit: If stopped since last visit:  ACE Inhibitor / Angiotensin Receptor Blocker (ARB) / Angiotensin Receptor Neprilysin inhibitor (ARNi) [] No >  [x] Yes > [] Stopped since last visit [] Never prescribed [] Started since last visit [x] Same medication as last     visit [] Different  medication than       last visit [Study personnel to answer]  Documented in EHR? [] No  [x] Yes   If no, Verified by: [] Photo of bottle [] Copy of rx [] Dispensing       pharmacy [] Prescribing provider [] Other (specify):                    Date started:        / /             MM DD YYYY Who prescribed this medication for you? [] Cardiology provider > [] Study clinic [] Outside clinic [] Endocrinology provider [] Primary care provider [] Other provider > Specify:                             [] Unknown Date discontinued:        / /             MM DD YYYY Why did you stop taking this medication? (check all that  apply) [] Allergic reaction [] Medication side effects [] Unable to       adhere/monitor [] Had an      operation/procedure      that required      stopping it [] Unable to afford it [] No longer wants        to take        this medication [] Provider decision [] Pregnancy [] Other (specify: ) [] Unknown Reason   If started or changed  What medication are you taking now? [] Benazepril (Lotensin) [] Captopril (Capoten) [] Enalapril (Vasotec) [] Fosinopril (Monopril) [] Lisinopril (Zestril, Prinivil) [] Quinapril (Accupril) [] Ramipril (Altace) [] Azilsartan Earnest Rosier) [] Candesartan (Atacand) [] Irbesartan (Avapro) [] Losartan (Cozaar) [] Olmesartan (Benicar) [] Telmisartan (Micardis) [] Valsartan (Diovan) [] Sacrubitril/Valsartan Delene Loll)      Medication Are you currently taking [insert name of previous med]? If currently taking: If started since last visit: If stopped since last visit:  Statin []  No >  [x] Yes > [] Stopped since last visit [] Never prescribed [] Started since last visit [x] Same medication as last        visit [] Different medication or dose     than last visit [Study personnel to answer]  Documented in EHR? [] No [x] Yes    If no, Verified by: [] Photo of bottle [] Copy of rx [] Dispensing pharmacy [] Prescribing provider [] Other (specify):                     Date started:        / /             MM DD YYYY Who prescribed this medication for you? [] Cardiology provider  [] Study clinic [] Outside clinic [] Endocrinology provider [] Primary care provider [] Other provider  Specify:                             [] Unknown Date discontinued:        / /             MM DD YYYY Why did you stop taking this medication? (check all that apply) [] Allergic reaction [] Medication side effects  [] Muscle aches [] Weakness [] Joint pain [] Cognitive symptoms [] Other (specify):                          [] Unable to        adhere/monitor [] Had an    operation/procedure    that required stopping it [] Unable to afford it [] No longer wants       to take this            medication [] Provider decision [] Pregnancy [] Other (specify: ) [] Unknown Reason   If started or changed  What medication are you taking now? [] Atorvastatin (Lipitor) [] Fluvastatin (Lescol) [] Lovastatin (Mevacor) [] Pravastatin (Pravachol) [] Rosuvastatin (Crestor) [] Simvastatin (Zocor) [] Pitatavastatin (Livalo)  Dose: [] 1 mg  [] 10 mg [] 2 mg  []  20 mg [] 3 mg  []  40 mg [] 4 mg  []  60 mg [] 5 mg  []  80 mg  Frequency: [] Daily []  Less than daily      Medication Are you currently taking [insert name of previous med]? If currently taking: If started since last visit: If stopped since last visit:  SGLT2 Inhibitor [] No   [x] Yes [] Stopped since last visit [] Never prescribed [] Started since last visit [x] Same medication as last      visit [] Different medication than last visit [Study personnel to answer]  Documented in EHR? [] No [x] Yes   If no, Verified by: [] Photo of bottle [] Copy of rx [] Dispensing       pharmacy []   Prescribing       provider [] Other (specify):                    Date started:        / /             MM DD YYYY Who prescribed this medication for you? [] Cardiology provider  [] Study clinic [] Outside clinic [] Endocrinology  provider [] Primary care provider [] Other provider  Specify:                             [] Unknown Date discontinued:        / /             MM DD YYYY Why did you stop taking this medication? (check all that apply) [] Allergic reaction [] Medication side effects [] Unable to       adhere/monitor [] Had an        operation/procedure         that required           stopping it [] Patient unable to       afford it [] Patient no longer       wants to       take this medication [] Provider decision [] Pregnancy [] Other (specify: ) [] Unknown Reason   If started or changed  What medication are you taking now? [] Canaglifozin (Invokana) [] Dapagliflozin (Farxiga) [] Empaglifozin (Jardiance) [] Ertugliflozin (Steglatro)     GLP1 Receptor Agonist [x] No   [] Yes [] Stopped since last visit [] Never prescribed [] Started since last visit [] Same medication as last      visit [] Different medication than       last visit [Study personnel to answer]  Documented in EHR? [] No [] Yes   If no, Verified by: [] Photo of bottle [] Copy of rx [] Dispensing       pharmacy [] Prescribing       provider [] Other (specify):                    Date started:        / /             MM DD YYYY Who prescribed this medication for you? [] Cardiology provider  [] Study clinic [] Outside clinic [] Endocrinology provider [] Primary care provider [] Other provider  Specify:                             [] Unknown Date discontinued:        / /             MM DD YYYY Why did you stop taking this medication? (check all that apply) [] Allergic reaction [] Medication side effects [] Unable to      adhere/monitor [] Had an    operation/procedure    that required stopping it [] Patient unable to        afford it [] Patient no longer        wants to take this        medication [] Provider decision [] Pregnancy [] Other (specify: ) [] Unknown Reason   If started or changed  What medication are you taking  now? [] Albiglutide (Tanzeum) [] Dulaglutide (Trulicity) [] Exanatide (Byetta, Bydureon) [] Liraglutide (Victoza, Saxenda) [] Lixisenatide (Adlyxin) [] Semaglutice (Ozempic)     06/07/2018 ([facility] Release)

## 2019-10-31 ENCOUNTER — Telehealth (INDEPENDENT_AMBULATORY_CARE_PROVIDER_SITE_OTHER): Payer: Medicare Other | Admitting: Medical

## 2019-10-31 ENCOUNTER — Other Ambulatory Visit: Payer: Self-pay

## 2019-10-31 ENCOUNTER — Encounter: Payer: Self-pay | Admitting: Medical

## 2019-10-31 VITALS — Ht 63.0 in | Wt 180.0 lb

## 2019-10-31 DIAGNOSIS — R05 Cough: Secondary | ICD-10-CM

## 2019-10-31 DIAGNOSIS — R0602 Shortness of breath: Secondary | ICD-10-CM

## 2019-10-31 DIAGNOSIS — R059 Cough, unspecified: Secondary | ICD-10-CM

## 2019-10-31 MED ORDER — AZITHROMYCIN 250 MG PO TABS
ORAL_TABLET | ORAL | 0 refills | Status: DC
Start: 2019-10-31 — End: 2020-04-26

## 2019-10-31 MED ORDER — ALBUTEROL SULFATE HFA 108 (90 BASE) MCG/ACT IN AERS: 2.0000 | INHALATION_SPRAY | RESPIRATORY_TRACT | 1 refills | Status: DC | PRN

## 2019-10-31 MED ORDER — EMERGEN-C IMMUNE PLUS PO PACK
1.0000 | PACK | Freq: Two times a day (BID) | ORAL | 0 refills | Status: DC
Start: 2019-10-31 — End: 2022-05-29

## 2019-10-31 NOTE — Progress Notes (Signed)
Subjective:     Patient ID: Shelly Blevins, female   DOB: 1946/02/01, 74 y.o.   MRN: [id number]  This visit type was conducted due to national recommendations for restrictions regarding the COVID-19 Pandemic (e.g. social distancing) in an effort to limit this patient's exposure and mitigate transmission in our community.  Due to their co-morbid illnesses, this patient is at least at moderate risk for complications without adequate follow up.  This format is felt to be most appropriate for this patient at this time.    Documentation for virtual audio and video telecommunications through Zoom encounter:  The patient was located at home. The provider was located in the office. The patient did consent to this visit and is aware of possible charges through their insurance for this visit.  The other persons participating in this telemedicine service were none. Time spent on call was 20 minutes and in review of previous records 20 minutes total.  This virtual service is not related to other E/M service within previous 7 days.   HPI Chief Complaint  Patient presents with   Cough    with scratchy throat   Virtual consult today for cough and scratchy throat.  Has had sinus pressure for a while.  This past week worsended into chest with mucous, some sob.  Lots of drainage in back of throat.  Symptoms of sinus for weeks, but the other symptoms began last week.  Has thick mucous.  No fever, no body aches or chills. No vomiting.  Legs ache though, attributes to arthritis.   No change in smell or taste.  No recent sick contacts.  She had moderna vaccine.  No recent covid test.   Using albuterol twice daily.  Does get improvement with albuterol.  Drinking plenty of water.   She is using Robitussin OTC  No other aggravating or relieving factors. No other complaint.   Review of Systems As in subjective    Objective:   Physical Exam Due to coronavirus pandemic stay at home measures, patient visit  was virtual and they were not examined in person.    Ht 5\' 3"  (1.6 m)    Wt 180 lb (81.6 kg)    BMI 31.89 kg/m      Assessment:     Encounter Diagnoses  Name Primary?   Cough Yes   Shortness of breath        Plan:     We discussed her symptoms and concerns.  We discussed limitations of virtual consult.  She seems a little short of breath on the phone.  I asked her to go get a Covid test [facility] and she can also get a [facility] at [facility].  If positive for Covid we will use a different recommendation and plan including infusion therapy.  Otherwise she will work on good hydration, rest, can use albuterol 2 puffs every 4 hours, for now begin Z-Pak antibiotic, she can continue Robitussin, add emergency mean plus over-the-counter.  Advised if worse in the next 2 to 3 days particular fever, or shortness of breath, then call recheck or go to the emergency department.  Makennah was seen today for cough.  Diagnoses and all orders for this visit:  Cough  Shortness of breath -     DG Chest 2 View; Future  Other orders -     azithromycin (ZITHROMAX) 250 MG tablet; 2 tablets day 1, then 1 tablet days 2-4 -     albuterol (VENTOLIN  HFA) 108 (90 Base) MCG/ACT inhaler; Inhale 2 puffs into the lungs every 4 (four) hours as needed for wheezing or shortness of breath. -     Multiple Vitamins-Minerals (EMERGEN-C IMMUNE PLUS) PACK; Take 1 tablet by mouth 2 (two) times daily.  f/u pending CXR, Covid test

## 2019-11-07 ENCOUNTER — Other Ambulatory Visit: Payer: Self-pay | Admitting: Medical

## 2019-11-09 ENCOUNTER — Other Ambulatory Visit: Payer: Self-pay | Admitting: Medical

## 2019-11-09 ENCOUNTER — Telehealth: Payer: Self-pay | Admitting: Medical

## 2019-11-09 ENCOUNTER — Other Ambulatory Visit: Payer: Self-pay

## 2019-11-09 MED ORDER — ALBUTEROL SULFATE HFA 108 (90 BASE) MCG/ACT IN AERS
2.0000 | INHALATION_SPRAY | Freq: Four times a day (QID) | RESPIRATORY_TRACT | 0 refills | Status: DC | PRN
Start: 2019-11-09 — End: 2020-04-03

## 2019-11-09 NOTE — Telephone Encounter (Signed)
Recv'd fax from [employer] that Ventolin no longer covered Proair is preferred.  Called pharmacy & pt didn't pick up Ventolin.  Changed to Proair & went thru for $8

## 2019-11-17 ENCOUNTER — Other Ambulatory Visit: Payer: Self-pay | Admitting: Medical

## 2019-11-17 DIAGNOSIS — Z1231 Encounter for screening mammogram for malignant neoplasm of breast: Secondary | ICD-10-CM

## 2019-11-30 ENCOUNTER — Telehealth: Payer: Self-pay | Admitting: Internal Medicine

## 2019-11-30 NOTE — Telephone Encounter (Signed)
[facility]ed [facility] to inquire about medication. Med is covered but is $47/month co-pay Called patient and relayed this info. She would like to pursue patient assistance.  Will leave application for her to pick up, as she has no computer

## 2019-11-30 NOTE — Telephone Encounter (Signed)
Pt c/o medication issue:  1. Name of Medication: JARDIANCE 10 MG TABS tablet  2. How are you currently taking this medication (dosage and times per day)? n/a  3. Are you having a reaction (difficulty breathing--STAT)? no  4. What is your medication issue? Patient states that her insurance will not cover this medication.

## 2019-12-12 ENCOUNTER — Other Ambulatory Visit: Payer: Self-pay | Admitting: Medical

## 2019-12-12 ENCOUNTER — Other Ambulatory Visit: Payer: Self-pay

## 2019-12-12 ENCOUNTER — Telehealth: Payer: Self-pay

## 2019-12-12 MED ORDER — RAMIPRIL 10 MG PO CAPS: 10.0000 mg | ORAL_CAPSULE | Freq: Every day | ORAL | 0 refills | Status: DC

## 2019-12-12 MED ORDER — HYDROCHLOROTHIAZIDE 25 MG PO TABS: 25.0000 mg | ORAL_TABLET | Freq: Every day | ORAL | 2 refills | Status: DC

## 2019-12-12 NOTE — Telephone Encounter (Signed)
This is Dr. Hilty's pt 

## 2019-12-12 NOTE — Telephone Encounter (Signed)
Received a fax from [place] for a refill on the pts. hctz and ramipril pt. Last apt was 10/31/19 and next apt is 03/28/20.

## 2019-12-13 MED ORDER — EZETIMIBE 10 MG PO TABS: 10.0000 mg | ORAL_TABLET | Freq: Every day | ORAL | 2 refills | Status: DC

## 2019-12-19 ENCOUNTER — Other Ambulatory Visit: Payer: Self-pay

## 2019-12-19 MED ORDER — AMLODIPINE BESYLATE 10 MG PO TABS: 10.0000 mg | ORAL_TABLET | Freq: Every day | ORAL | 2 refills | Status: DC

## 2019-12-27 ENCOUNTER — Other Ambulatory Visit: Payer: Self-pay

## 2019-12-27 ENCOUNTER — Ambulatory Visit
Admission: RE | Admit: 2019-12-27 | Discharge: 2019-12-27 | Disposition: A | Discharge status: Home / Self Care | Payer: Medicare Other | Source: Ambulatory Visit | Attending: Medical | Admitting: Medical

## 2019-12-27 DIAGNOSIS — Z1231 Encounter for screening mammogram for malignant neoplasm of breast: Secondary | ICD-10-CM

## 2020-01-02 LAB — HM DIABETES EYE EXAM

## 2020-01-04 ENCOUNTER — Telehealth: Payer: Self-pay | Admitting: Internal Medicine

## 2020-01-04 NOTE — Telephone Encounter (Signed)
    Pt c/o medication issue:  1. Name of Medication:   JARDIANCE 10 MG TABS tablet   2. How are you currently taking this medication (dosage and times per day)? TAKE 1 TABLET BY MOUTH DAILY BEFORE BREAKFAST.  3. Are you having a reaction (difficulty breathing--STAT)?   4. What is your medication issue? Pt said this med is expensive, she asked if there's a generic alternative

## 2020-01-04 NOTE — Telephone Encounter (Signed)
Patient called in to office in regards to her Jardiance prescriptions. Patient states with her insurance it is too expensive. Advised patient of patient assistance application, patient stated she would like to apply for this. Advised patient that 2 weeks worth of samples, and patient assistance application will be placed at the front desk for pick up. Advised patient to return forms once completed back to the office. Patient verbalized understanding.   Medication Samples have been provided to the patient.  Drug name: Jardiance       Strength: 10mg        Qty: 2 bottles- 14 tablets  LOT: [id number]  Exp.Date: 08/23  Dosing instructions: Take 10mg  (1 tablet) daily before breakfast  The patient has been instructed regarding the correct time, dose, and frequency of taking this medication, including desired effects and most common side effects.   Rella Egelston B Seylah Wernert 10:04 AM 01/04/2020

## 2020-01-10 ENCOUNTER — Encounter: Payer: Self-pay | Admitting: Medical

## 2020-01-10 ENCOUNTER — Ambulatory Visit (INDEPENDENT_AMBULATORY_CARE_PROVIDER_SITE_OTHER): Payer: Medicare Other | Admitting: Medical

## 2020-01-10 ENCOUNTER — Other Ambulatory Visit: Payer: Self-pay

## 2020-01-10 VITALS — BP 132/82 | HR 60 | Temp 97.8°F | Wt 183.6 lb

## 2020-01-10 DIAGNOSIS — I2510 Atherosclerotic heart disease of native coronary artery without angina pectoris: Secondary | ICD-10-CM | POA: Diagnosis not present

## 2020-01-10 DIAGNOSIS — Z23 Encounter for immunization: Secondary | ICD-10-CM

## 2020-01-10 DIAGNOSIS — G8929 Other chronic pain: Secondary | ICD-10-CM

## 2020-01-10 DIAGNOSIS — E785 Hyperlipidemia, unspecified: Secondary | ICD-10-CM

## 2020-01-10 DIAGNOSIS — M25561 Pain in right knee: Secondary | ICD-10-CM

## 2020-01-10 DIAGNOSIS — I10 Essential (primary) hypertension: Secondary | ICD-10-CM

## 2020-01-10 DIAGNOSIS — I2583 Coronary atherosclerosis due to lipid rich plaque: Secondary | ICD-10-CM

## 2020-01-10 DIAGNOSIS — E119 Type 2 diabetes mellitus without complications: Secondary | ICD-10-CM

## 2020-01-10 DIAGNOSIS — Z794 Long term (current) use of insulin: Secondary | ICD-10-CM

## 2020-01-10 DIAGNOSIS — E039 Hypothyroidism, unspecified: Secondary | ICD-10-CM

## 2020-01-10 DIAGNOSIS — M25562 Pain in left knee: Secondary | ICD-10-CM

## 2020-01-10 DIAGNOSIS — M170 Bilateral primary osteoarthritis of knee: Secondary | ICD-10-CM

## 2020-01-10 DIAGNOSIS — Z7185 Encounter for immunization safety counseling: Secondary | ICD-10-CM

## 2020-01-10 NOTE — Progress Notes (Signed)
Subjective: Chief Complaint  Patient presents with  . Diabetes    diabetes check memory getting a little worse, knees and wrist hurting in the morning, checking BS daily running pretty good, had diabetic eye exam last week, daily feet check feet look fine and not hurting   Here for med check  HTN - no BP cuff at home.  Compliant with medications. No chest pain, no difficulty breathing, no edema  Exercise - limited by knees.  Lately been giving some problems.   If sititng prolonted, has hard time getting up.  Knees hurt every morning.  Wrists hurt as well.  Not seeing rheumatology or orthopedics. No swelling of joints. No other joint pains.  Hyperlipidemia-compliant with statin without complaint  Diabetes-managed by endocrinology, compliant with medications. Blood sugars have been running pretty good lately  Hypothyroidism-managed by endocrinology, compliant with medications  Otherwise been in normal state of health.  Past Medical History:  Diagnosis Date  . Allergy   . Arthritis   . CAD (coronary artery disease)    mild nonobstructive disease, 30% Cfx lesion  . Cataract   . Diabetes mellitus    insulin-dependent; Dr. Chalmers Cater  . Dyslipidemia   . GERD (gastroesophageal reflux disease)   . Hyperlipidemia   . Hypertension   . Hypothyroidism    Dr. Chalmers Cater  . Stroke [facility])     Current Outpatient Medications on File Prior to Visit  Medication Sig Dispense Refill  . albuterol (VENTOLIN HFA) 108 (90 Base) MCG/ACT inhaler Inhale 2 puffs into the lungs every 6 (six) hours as needed for wheezing or shortness of breath. proair is covered by insurance 8 g 0  . amLODipine (NORVASC) 10 MG tablet Take 1 tablet (10 mg total) by mouth daily. 90 tablet 2  . aspirin EC 81 MG tablet Take 1 tablet (81 mg total) by mouth daily. 90 tablet 3  . atorvastatin (LIPITOR) 80 MG tablet Take 1 tablet (80 mg total) by mouth daily. 90 tablet 0  . BD PEN NEEDLE NANO 2ND GEN 32G X 4 MM MISC 2 (two) times daily.  as directed    . cholecalciferol (VITAMIN D3) 25 MCG (1000 UT) tablet TAKE 1 TABLET BY MOUTH EVERY DAY 90 tablet 3  . ezetimibe (ZETIA) 10 MG tablet Take 1 tablet (10 mg total) by mouth daily. 90 tablet 2  . hydrochlorothiazide (HYDRODIURIL) 25 MG tablet Take 1 tablet (25 mg total) by mouth daily. 90 tablet 2  . JARDIANCE 10 MG TABS tablet TAKE 1 TABLET BY MOUTH DAILY BEFORE BREAKFAST. 30 tablet 5  . latanoprost (XALATAN) 0.005 % ophthalmic solution Place 1 drop into both eyes at bedtime.  5  . levothyroxine (SYNTHROID, LEVOTHROID) 50 MCG tablet Take 50 mcg by mouth daily before breakfast.    . lidocaine (XYLOCAINE) 5 % ointment Apply 1 application topically as needed for mild pain.     . Multiple Vitamins-Minerals (EMERGEN-C IMMUNE PLUS) PACK Take 1 tablet by mouth 2 (two) times daily. 10 each 0  . NOVOLOG MIX 70/30 FLEXPEN (70-30) 100 UNIT/ML FlexPen Inject 60 Units into the skin 2 (two) times daily.   3  . omeprazole (PRILOSEC) 40 MG capsule Take 40 mg by mouth every morning.    Glory Rosebush VERIO test strip 3 (three) times daily.    . ramipril (ALTACE) 10 MG capsule Take 1 capsule (10 mg total) by mouth daily. 90 capsule 0  . SitaGLIPtin-MetFORMIN HCl (JANUMET XR) 50-500 MG TB24 Take 1 tablet by mouth 2 (two)  times daily.    Marland Kitchen azithromycin (ZITHROMAX) 250 MG tablet 2 tablets day 1, then 1 tablet days 2-4 (Patient not taking: Reported on 01/10/2020) 6 tablet 0  . ibuprofen (ADVIL,MOTRIN) 200 MG tablet Take 400 mg by mouth every 6 (six) hours as needed for headache or moderate pain. (Patient not taking: Reported on 10/31/2019)     No current facility-administered medications on file prior to visit.   Review of systems as in subjective   Objective: BP 132/82   Pulse 60   Temp 97.8 F (36.6 C)   Wt 183 lb 9.6 oz (83.3 kg)   LMP  (LMP Unknown)   BMI 32.52 kg/m   General appearence: alert, no distress, WD/WN,  Neck: supple, no lymphadenopathy, no thyromegaly, no masses Heart: RRR, normal  S1, S2, no murmurs Lungs: CTA bilaterally, no wheezes, rhonchi, or rales Abdomen: +bs, soft, non tender, non distended, no masses, no hepatomegaly, no splenomegaly Pulses: 2+ symmetric, upper and lower extremities, normal cap refill No extremity edema Bilateral knees with some bony arthritic changes of the joint, no obvious swelling, no obvious deformity, no laxity, range of motion seems full, rest of leg exam unremarkable   Assessment: Encounter Diagnoses  Name Primary?  . Need for influenza vaccination Yes  . Need for COVID-19 vaccine   . Essential hypertension   . Coronary artery disease due to lipid rich plaque   . Hypothyroidism, unspecified type   . Controlled type 2 diabetes mellitus without complication, with long-term current use of insulin ([place])   . Dyslipidemia   . Vaccine counseling   . Chronic pain of both knees   . Osteoarthritis of both knees, unspecified osteoarthritis type       Plan Hypertension-we discussed goal blood pressure, advise she get a cuff to start checking some so she knows where her pressures are running. Continue current medications  Diabetes and hypothyroidism-managed by endocrinology, sees him next month. Reviewed her labs in the chart record from earlier this year  Hyperlipidemia  You are compliant with medication  Continue your current cholesterol medication  Your cholesterol numbers look great in May 2021  Knee and wrist osteoarthritis-reviewed knee x-rays from December 2020. Referral to orthopedics for further management. Avoid NSAIDs given diabetes, hypertension, risk to the kidneys. She can use topical creams for now for comfort as needed   Vaccine counseling Counseled on the influenza virus vaccine.  Vaccine information sheet given.  Influenza vaccine given after consent obtained.  Counseled on the Covid vaccine. Vaccine information given.  Moderna Covid booster given today.  Shingles vaccine:  I recommend you have a shingles  vaccine to help prevent shingles or herpes zoster outbreak.   Please call your insurer to inquire about coverage for the Shingrix vaccine given in 2 doses.   Some insurers cover this vaccine after age 21, some cover this after age 73.  If your insurer covers this, then call to schedule appointment to have this vaccine here.  We also discussed that she is due for pneumonia 23 vaccine  Follow-up in December for yearly physical/well visit  Ellowyn was seen today for diabetes.  Diagnoses and all orders for this visit:  Need for influenza vaccination -     Flu Vaccine QUAD High Dose(Fluad)  Need for COVID-19 vaccine -     Moderna SARS-CoV-2 Vaccine  Essential hypertension  Coronary artery disease due to lipid rich plaque  Hypothyroidism, unspecified type  Controlled type 2 diabetes mellitus without complication, with long-term current use of  insulin ([facility])  Dyslipidemia  Vaccine counseling  Chronic pain of both knees  Osteoarthritis of both knees, unspecified osteoarthritis type

## 2020-01-10 NOTE — Progress Notes (Signed)
Referral has been sent.

## 2020-01-16 ENCOUNTER — Ambulatory Visit: Payer: Medicare Other | Admitting: Orthopaedic Surgery

## 2020-01-16 ENCOUNTER — Ambulatory Visit: Payer: Self-pay

## 2020-01-16 ENCOUNTER — Encounter: Payer: Self-pay | Admitting: Orthopaedic Surgery

## 2020-01-16 VITALS — Ht 63.0 in | Wt 183.0 lb

## 2020-01-16 DIAGNOSIS — M25562 Pain in left knee: Secondary | ICD-10-CM | POA: Diagnosis not present

## 2020-01-16 DIAGNOSIS — M25561 Pain in right knee: Secondary | ICD-10-CM

## 2020-01-16 DIAGNOSIS — G8929 Other chronic pain: Secondary | ICD-10-CM | POA: Diagnosis not present

## 2020-01-16 NOTE — Progress Notes (Signed)
Office Visit Note   Patient: Shelly Blevins           Date of Birth: [birth year]           MRN: [id number] Visit Date: 01/16/2020              Requested by: Carlena Hurl, PA-C [address] [place],  [place] [id number] PCP: Carlena Hurl, PA-C   Assessment & Plan: Visit Diagnoses:  1. Chronic pain of both knees     Plan: Impression is bilateral knee moderate DJD.  Overall symptoms are well-tolerated.  Based on our discussion of treatment options she would like to try topical Voltaren gel for now.  She would like to avoid p.o. NSAIDs due to multiple medications for hypertension and heart issues.  Patient will return if she decides that she wants to try cortisone injections.  Follow-up as needed otherwise.  Follow-Up Instructions: Return if symptoms worsen or fail to improve.   Orders:  Orders Placed This Encounter  Procedures  . XR KNEE 3 VIEW LEFT  . XR KNEE 3 VIEW RIGHT   No orders of the defined types were placed in this encounter.     Procedures: No procedures performed   Clinical Data: No additional findings.   Subjective: Chief Complaint  Patient presents with  . Right Knee - Pain  . Left Knee - Pain    Shelly Blevins is a very pleasant 74 year old female here for evaluation of bilateral knee pain for several months worse on the left side.  Is worse in the morning and has start up stiffness and pain.  She retired in January.  She denies any giving way or catching or popping.  She has occasional swelling.  Takes Tylenol without significant relief.  Unable to take NSAIDs due to hypertension.   Review of Systems  Constitutional: Negative.   HENT: Negative.   Eyes: Negative.   Respiratory: Negative.   Cardiovascular: Negative.   Endocrine: Negative.   Musculoskeletal: Negative.   Neurological: Negative.   Hematological: Negative.   Psychiatric/Behavioral: Negative.   All other systems reviewed and are negative.    Objective: Vital Signs: Ht 5\' 3"   (1.6 m)   Wt 183 lb (83 kg)   LMP  (LMP Unknown)   BMI 32.42 kg/m   Physical Exam Vitals and nursing note reviewed.  Constitutional:      Appearance: She is well-developed.  HENT:     Head: Normocephalic and atraumatic.  Pulmonary:     Effort: Pulmonary effort is normal.  Abdominal:     Palpations: Abdomen is soft.  Musculoskeletal:     Cervical back: Neck supple.  Skin:    General: Skin is warm.     Capillary Refill: Capillary refill takes less than 2 seconds.  Neurological:     Mental Status: She is alert and oriented to person, place, and time.  Psychiatric:        Behavior: Behavior normal.        Thought Content: Thought content normal.        Judgment: Judgment normal.     Ortho Exam Bilateral knees show no joint effusions.  Mildly limited range of motion with patellofemoral crepitus.  Collaterals and cruciates are stable. Specialty Comments:  No specialty comments available.  Imaging: XR KNEE 3 VIEW LEFT  Result Date: 01/16/2020 Moderate tricompartment DJD.  XR KNEE 3 VIEW RIGHT  Result Date: 01/16/2020 Moderate tricompartmental DJD    PMFS History: Patient Active Problem List  Diagnosis Date Noted  . Osteoarthritis of both knees 01/10/2020  . Chronic pain of both knees 03/28/2019  . Vitamin D deficiency 03/28/2019  . Dizziness 11/02/2016  . Ataxia 11/02/2016  . Estrogen deficiency 11/02/2016  . Fall 11/02/2016  . Controlled type 2 diabetes mellitus without complication, with long-term current use of insulin ([place]) 11/02/2016  . Vaccine counseling 10/09/2015  . Advanced directives, counseling/discussion 10/09/2015  . Need for prophylactic vaccination against Streptococcus pneumoniae (pneumococcus) 10/09/2015  . Cancer screening 10/09/2015  . Hypothyroidism 04/17/2015  . Essential hypertension 06/06/2013  . Dyslipidemia 06/06/2013  . Coronary artery disease due to lipid rich plaque 06/06/2013  . Insulin dependent diabetes mellitus 10/29/2008    . GERD 10/29/2008  . CONSTIPATION 10/29/2008   Past Medical History:  Diagnosis Date  . Allergy   . Arthritis   . CAD (coronary artery disease)    mild nonobstructive disease, 30% Cfx lesion  . Cataract   . Diabetes mellitus    insulin-dependent; Dr. Chalmers Cater  . Dyslipidemia   . GERD (gastroesophageal reflux disease)   . Hyperlipidemia   . Hypertension   . Hypothyroidism    Dr. Chalmers Cater  . Stroke [facility])     Family History  Problem Relation Age of Onset  . Heart failure Mother   . Diabetes Mother   . Sudden death Sister        died at 51  . Heart failure Brother        died at 41  . Colon cancer Neg Hx   . Esophageal cancer Neg Hx   . Stomach cancer Neg Hx   . Rectal cancer Neg Hx     Past Surgical History:  Procedure Laterality Date  . ABDOMINAL HYSTERECTOMY    . CARDIAC CATHETERIZATION  02/17/2007   mild CAD with 50% prox L Cfx stenosis, normal LV function (Dr. Norlene Duel)  . CHOLECYSTECTOMY    . EYE SURGERY    . NM MYOCAR PERF WALL MOTION  2009   lexiscan myoview - perfusion defect in anterior myociaral region (breast attenuation) - low risk scan - EF 69%  . THYROIDECTOMY    . TRANSTHORACIC ECHOCARDIOGRAM  2008   normal study    Social History   Occupational History    Employer: [employer]  Tobacco Use  . Smoking status: Former Smoker    Quit date: 04/02/1986    Years since quitting: 33.8  . Smokeless tobacco: Never Used  Vaping Use  . Vaping Use: Never used  Substance and Sexual Activity  . Alcohol use: No  . Drug use: No  . Sexual activity: Not on file

## 2020-01-30 ENCOUNTER — Telehealth: Payer: Self-pay | Admitting: *Deleted

## 2020-01-30 ENCOUNTER — Encounter: Payer: Self-pay | Admitting: *Deleted

## 2020-01-30 DIAGNOSIS — Z006 Encounter for examination for normal comparison and control in clinical research program: Secondary | ICD-10-CM

## 2020-01-30 NOTE — Telephone Encounter (Signed)
I called patient for 12 month Coordinate-Diabetes Study. I discussed the importance of following up with doctor every 3 months for management of diabetes control.  I reminded patient importance of following diabetic diet and daily exercise. Patient verbalized understanding. I thanked patient for participating in the study.

## 2020-02-02 ENCOUNTER — Other Ambulatory Visit: Payer: Self-pay | Admitting: Medical

## 2020-02-02 ENCOUNTER — Encounter: Payer: Self-pay | Admitting: Medical

## 2020-02-08 ENCOUNTER — Encounter: Payer: Self-pay | Admitting: Medical

## 2020-02-10 ENCOUNTER — Other Ambulatory Visit: Payer: Self-pay | Admitting: Medical

## 2020-02-29 ENCOUNTER — Other Ambulatory Visit: Payer: Self-pay

## 2020-02-29 ENCOUNTER — Ambulatory Visit (INDEPENDENT_AMBULATORY_CARE_PROVIDER_SITE_OTHER): Payer: Medicare Other | Admitting: Medical

## 2020-02-29 ENCOUNTER — Encounter: Payer: Self-pay | Admitting: Medical

## 2020-02-29 VITALS — BP 130/60 | HR 65 | Ht 63.0 in | Wt 181.0 lb

## 2020-02-29 DIAGNOSIS — R140 Abdominal distension (gaseous): Secondary | ICD-10-CM | POA: Insufficient documentation

## 2020-02-29 DIAGNOSIS — Z794 Long term (current) use of insulin: Secondary | ICD-10-CM

## 2020-02-29 DIAGNOSIS — E039 Hypothyroidism, unspecified: Secondary | ICD-10-CM

## 2020-02-29 DIAGNOSIS — K5900 Constipation, unspecified: Secondary | ICD-10-CM | POA: Insufficient documentation

## 2020-02-29 DIAGNOSIS — R109 Unspecified abdominal pain: Secondary | ICD-10-CM | POA: Insufficient documentation

## 2020-02-29 DIAGNOSIS — R1084 Generalized abdominal pain: Secondary | ICD-10-CM | POA: Insufficient documentation

## 2020-02-29 DIAGNOSIS — I10 Essential (primary) hypertension: Secondary | ICD-10-CM

## 2020-02-29 DIAGNOSIS — E119 Type 2 diabetes mellitus without complications: Secondary | ICD-10-CM

## 2020-02-29 LAB — POCT URINALYSIS DIP (PROADVANTAGE DEVICE)
Blood, UA: NEGATIVE
Glucose, UA: 500 mg/dL — AB
Leukocytes, UA: NEGATIVE
Nitrite, UA: NEGATIVE
Specific Gravity, Urine: 1.015
Urobilinogen, Ur: 0.2
pH, UA: 6 (ref 5.0–8.0)

## 2020-02-29 MED ORDER — LINACLOTIDE 72 MCG PO CAPS: 72.0000 ug | ORAL_CAPSULE | ORAL | 0 refills | Status: DC

## 2020-02-29 NOTE — Patient Instructions (Signed)
Your symptoms today could be constipation related  Begin Linzess 1 capsule every other day for constipation  If not improving, you can change to every day  However, if loose stools, then use 1 capsul every 2-4 days  Call back within a week to update me on symptoms  If not improving or worse next week, we can consider labs and a scan of the abdomen  If severe pain over the weekend, go to the emergency department

## 2020-02-29 NOTE — Progress Notes (Signed)
Subjective:  Shelly Blevins is a 74 y.o. female who presents for Chief Complaint  Patient presents with  . Abdominal Pain    middle part of [place] doctor Dr. Scarlette Shorts  Gastroenterology Dr. Lyman Bishop to cardiology Dr. Chalmers Cater, Endocrinology Anahy Esh, Camelia Eng, PA-C here for primary care  She notes sharp pains around abdomen, mid abdomen.  Started a few days ago.  Not necessarily worse with food, but didn't eat the first 2 days.  Ate squash casserole and ham last night, didn't make it worse.  Symptoms are intermittent.  No diarrhea.  Drank prune juice Monday, had some BMs.   But then later still had discomfort.   Feels constipated.  Has been bloated and gassy.   Recently in last few weeks has BM maybe every few days, but yesterday several stools after doing prune juice.   No blood in stool.   No  Urinary c/o.  No blood, no odor, no frequency or urgency. No back pain.   No fever.  No changes in appetite.  No other aggravating or relieving factors.   No other c/o.  Past Medical History:  Diagnosis Date  . Allergy   . Arthritis   . CAD (coronary artery disease)    mild nonobstructive disease, 30% Cfx lesion  . Cataract   . Diabetes mellitus    insulin-dependent; Dr. Chalmers Cater  . Dyslipidemia   . GERD (gastroesophageal reflux disease)   . Hyperlipidemia   . Hypertension   . Hypothyroidism    Dr. Chalmers Cater  . Stroke [facility])    Past Surgical History:  Procedure Laterality Date  . ABDOMINAL HYSTERECTOMY    . CARDIAC CATHETERIZATION  02/17/2007   mild CAD with 50% prox L Cfx stenosis, normal LV function (Dr. Norlene Duel)  . CHOLECYSTECTOMY    . EYE SURGERY    . NM MYOCAR PERF WALL MOTION  2009   lexiscan myoview - perfusion defect in anterior myociaral region (breast attenuation) - low risk scan - EF 69%  . THYROIDECTOMY    . TRANSTHORACIC ECHOCARDIOGRAM  2008   normal study    The following portions of the patient's history were reviewed and updated as appropriate:  allergies, current medications, past family history, past medical history, past social history, past surgical history and problem list.  ROS Otherwise as in subjective above    Objective: BP 130/60   Pulse 65   Ht 5\' 3"  (1.6 m)   Wt 181 lb (82.1 kg)   LMP  (LMP Unknown)   SpO2 98%   BMI 32.06 kg/m   General appearance: alert, no distress, well developed, well nourished Oral cavity: MMM, no lesions Abdomen: +bs, soft, mild right sided and left sided tendnerss, otherwise non tender, non distended, no masses, no hepatomegaly, no splenomegaly, no guarding or rebound Back nontender Pulses: 2+ radial pulses, 2+ pedal pulses, normal cap refill Ext: no edema   Assessment: Encounter Diagnoses  Name Primary?  . Abdominal pain, unspecified abdominal location Yes  . Constipation, unspecified constipation type   . Bloating   . Essential hypertension   . Controlled type 2 diabetes mellitus without complication, with long-term current use of insulin ([place])   . Hypothyroidism, unspecified type      Plan: We discussed her symptoms and possible causes.  Likely constipation related.  She is going to begin Linzess every other day for the next week.  If not improving she can change to daily.  If much  worse pain over the weekend, then go to the emergency department.  If she starts having loose stools she will back off the Linzess every few days.  I reviewed her colonoscopy from last year that showed some polyps but no worrisome otherwise findings  If not improving within the next week consider labs and scan of the abdomen  She also wondered about Metformin causing some problems.  She had to have her endocrinologist change the type of Metformin in the past due to problems.  Consider this as a potential cause of bloating as well  Urinalysis reviewed today  Chanita was seen today for abdominal pain.  Diagnoses and all orders for this visit:  Abdominal pain, unspecified abdominal location -      POCT Urinalysis DIP (Proadvantage Device)  Constipation, unspecified constipation type  Bloating  Essential hypertension  Controlled type 2 diabetes mellitus without complication, with long-term current use of insulin ([facility])  Hypothyroidism, unspecified type  Other orders -     linaclotide (LINZESS) 72 MCG capsule; Take 1 capsule (72 mcg total) by mouth every other day.    Follow up: call report next week

## 2020-03-14 NOTE — Research (Signed)
COORDINATE-Diabetes 12 Month CASE REPORT FORM (Intervention) -EHR Site #:   [id number]              Patient ID: [id number]    [facility] REVIEW  Medical Record Check Date 01/30/2020  Vital Status _0 Patient Alive >Date last known alive per EHR:   _1 Patient Dead >> Complete Death Form  _2 Unknown   CLINICAL EVENTS / PROCEDURES  Hospitalization since last visit? (>=24 hour stay) _3 No _4 Yes  >> if yes, Complete the following  Date of hospital admission:        / /             MM DD YYYY  Primary discharge diagnosis:  *Complete appropriate event validation form _5 acute myocardial infarction (heart attack)* _6 stroke* _7 heart failure* _8 coronary revascularization* _9 peripheral revascularization* _10 cerebral revascularization* _11 diabetes (e.g. hypoglycemia, DKA) _12 renal failure _13 amputation _14 other cardiovascular reason _15 other NON-cardiovascular reason _16 unknown  Other diagnoses not documented above: (check all that apply)  *Complete appropriate event validation form _17 acute myocardial infarction (heart attack)* _18 stroke* _19 heart failure* _20 coronary revascularization* _21 peripheral revascularization* _22 cerebral revascularization* _23 diabetes (e.g. hypoglycemia, DKA) _24 renal failure _25 amputation _26 other cardiovascular reason _27 other NON-cardiovascular reason _28 unknown  Were any of the following outpatient procedures done since the last visit? (I.e. procedures not captured above)  Coronary revascularization _29 No _30 Yes >IF YES, Date / /             MM DD YYYY  Peripheral revascularization _31 No _32 Yes > IF YES, Date / /             MM DD YYYY  Cerebral revascularization _33 No _34 Yes > IF YES, Date / /             MM DD YYYY  Extremity amputation    _35 No _36 Yes >IF YES, Date / /             MM      DD       YYYY  Renal replacement therapy (i.e. dialysis)    _37 No _38 Yes > IF YES, Date of initiation / /             MM      DD       YYYY   **EDC will allow for collection of  multiple hospitalizations and procedures   MEDICATIONS  Medication Currently Prescribed? If started since last visit: If not started since last visit: If stopped since last visit:  Cardiac Medications  ACE Inhibitor / Angiotensin Receptor Blocker (ARB) / Angiotensin Receptor Neprilysin inhibitor (ARNi)  _39 No >  _40 Yes > Since last visit, medication was: _41 Stopped _42 Not started _43 Started _44 Continued same medication _45 Continued with medication changes Date started:        / /             MM DD YYYY Who prescribed? _46 Cardiology provider  _47 Study clinic _48 Outside clinic _49 Endocrinology provider _50 Primary care provider _51 Other provider  Specify:                             _52 Unknown Reason (check all that apply): _53 History of swelling around lips, eyes or face _54 Feeling dizzy/lightheaded _55 Low blood pressure _56 Poor or fluctuating kidney function _57 High potassium _58 Patient has experienced other side effects to this medication  before _59 Patient will be unable to adhere/monitor _60 Patient unable to afford it _61 Patient does not want to      take this medication _62 Pregnancy _63 Other (specify: ) _64 Unknown Reason Date discontinued:        / /  MM DD YYYY Reason (check all that apply): _0 Swelling around lips, eyes       or face _1 Feeling dizzy/lightheaded _2 Low blood pressure _3 Poor or fluctuating kidney function _4 High potassium _5 Other medication side       effects _6 Patient unable to        adhere/monitor _7 Had an       operation/procedure that        required stopping it _8 Patient unable to afford it _9 Patient no longer wants       to take this medication _10 Pregnancy _11 Other (specify: ) _12 Unknown Reason   If started or changed  Medication Name: _13 Benazepril (Lotensin) _14 Captopril (Capoten) _15 Enalapril (Vasotec) _16 Fosinopril (Monopril) _17 Lisinopril (Zestril, Prinivil) _18 Quinapril (Accupril) _19 Ramipril (Altace) _20 Azilsartan  (Edarbi) _21 Candesartan (Atacand) _22 Irbesartan (Avapro) _23 Losartan (Cozaar) _24 Olmesartan (Benicar) _25 Telmisartan (Micardis) _26 Valsartan (Diovan) _27 Sacrubitril/Valsartan (Entresto)     Beta Blocker _28 No _29  Yes > _30 Acebutolol (Sectral) _31 Bisoprolol (Zebeta) _32 Carvedilol (Coreg) _33 Labetalol (Trandate,      Normodyne) _34 Metoprolol succinate (Toprol) _35 Metoprolol tartrate       (Lopressor) _36 Nadolol (Corgard) _37 Nebivolol (Bystolic) <PZWCHENIDPOEUMPN>_3<\/IRWERXVQMGQQPYPP>_50 Propranolol (Inderal) _39 Sotalol (Betapace)     Medication Currently Prescribed? If started since last visit: If not started since last visit: If stopped since last visit:  Aldosterone Antagonist _40 No _41 Yes > _42 Amiloride _43 Eplerenone (Inspra) _44 Spirinolactone (Aldactone) _45 Traimterene (Dyrenium)   Calcium Channel Blocker _46 No _47  Yes > Medication Name: _48 Amlodipine (Norvasc) _49 Diltiazem (Cardizem) _50 Felodipine (Plendil) _51 Nifedipine (Procardia) _52 Verapamil (Calan)   Diuretic Loop _53 No _54  Yes > Medication Name: _55 Bumetanide (Bumex) _56 Ethacrynic acid (Edecrin) _57 Furosemide (Lasix) _58 Torsemide (Demadex)   Diuretic Thiazide- type _59 No _60  Yes > Medication Name: _61 Chlorothiazide      _62 Chlorthalidone _63 Hydrochlorothiazide _64 Indapamide _65 Metolazone   Anticoagulation Therapy (other than Warfarin) _66 No _67  Yes > Medication Name: _68 Apixaban (Eliquis) _69 Edoxaban (Lixiana) _70 Rivaroxaban Alen Blew) _71 Dabigatran (Redaxa)   Warfarin _72 No _73  Yes    Antiplatelet Agent (including aspirin) _74 No _75  Yes > Medication Name (check all that apply): _76 Aspirin _77 Clopidogrel (Plavix) _78 Prasugrel (Effient) _79 Ticagrelor (Brilinta) _80 Ticlopidine (Ticlid) _81 Dipyridamole (Persantine)    Medication Currently Prescribed? If started since last visit: If not started since last visit: If stopped since last visit:  Statin  _82 No >  _83 Yes > Since last visit, medication was: _84 Stopped _85 Not started _86 Started _87 Continued same  medication and dose _88 Continued with dose or medication changes Date started:        / /             MM DD YYYY Who prescribed? _89 Cardiology provider _90 Endocrinology provider _91 Primary care provider _92 Other provider  Specify:                             _93 Unknown Reason (check all that apply): _94 History of Rhabdomyolysis _95 LDL-cholesterol already       <70 _96 Muscle      aches/pain/weakness _97 Mental       fogginess/memory loss _98 Liver dysfunction _99 Patient has  experienced other side   effects to this medication before _100 Patient will be unable to adhere/monitor _101 Patient unable to afford it _102 Patient does not want to take this medication _103 Pregnancy _104 Other (specify: ) _105 Unknown Reason Date discontinued:        / /             MM DD YYYY Reason (check all that apply): _106 Rhabdomyolysis _107 Muscle aches/pain/weakness _108 Mental fogginess/memory loss _109 Liver dysfunction _110 Other medication side effects _111 Patient unable to          adhere/monitor _112 Patient unable to afford it _113 Patient no longer wants to take  this medication _0 Pregnancy _1 Other (specify: ) _2 Unknown Reason   If started or changed  Medication Name: _3 Atorvastatin (Lipitor) _4 Fluvastatin (Lescol) _5 Lovastatin (Mevacor) _6 Pravastatin (Pravachol) _7 Rosuvastatin (Crestor) _8 Simvastatin (Zocor) _9 Pitatavastatin (Livalo)  Dose: _10 1 mg _11 10 mg _12 2 mg _13  20 mg _14 3 mg _15  40 mg _16 4 mg _17  60 mg _18 5 mg _19  80 mg  Frequency: _20 Daily  _21 Less than daily      Does the patient have statin intolerance that prevents the use of maximum dose of high potency statin? _22 No _23 Yes >IF YES, Complete Statin Intolerance form   Non-statin lipid lowering therapy _24 No _25 Yes > Medication Name (check all that apply): _26 Colesevelam (Welchol) _27 Ezetimibe (Zetia) _28 Fibrate _29 Niacin _30 PCSK9 inhibitor _31 Omega 3 acid ethyl esters (Lovaza) _32 Icosapent Ethyl (Vascepa) _33 Over the counter omega 3 fatty acid or fish  oil supplement     Medication Currently Prescribed? If started since last visit: If not started since last visit: If stopped since last visit:  Diabetes Medications  SGLT2 Inhibitor  _34 No >  _35 Yes > Since last visit, medication was: _36 Stopped _37 Not started _38 Started _39 Continued same medication _40 Continued with medication changes Date started:        / /             MM DD YYYY Who prescribed? _41 Cardiology provider  _42 Study clinic _43 Outside clinic _44 Endocrinology      provider _45 Primary care provider _46 Other provider  Specify:                             _47 Unknown Reason (check all that apply): _48 eGFR <45 _49 HbA1c<7% on metformin monotherapy OR already on GLP1RA and do not need to start another anti- hyperglycemic _50 Already dehydrated _51 Low blood pressure _52 High risk of Hypoglycemia _53 Prior DKA _54 Recurrent mycotic genital infections _55 History of or at risk for amputation _56 Patient has experienced other side effects to this medication before _57 Patient will be unable to adhere/monitor _58 Patient unable to afford it _59 Patient does not want to take this medication _60 Pregnancy _61 Other (specify: ) _62 Unknown Reason Date discontinued:        / /             MM DD YYYY Reason (check all that apply  _63 eGFR now <45 _64 Dehydration _65 Low blood pressure _66 Hypoglycemia _67 DKA _68 Mycotic genital infection _69 Amputation _70 Other medication side effects _71 Patient unable    to adhere/monitor  _72 Had an operation/procedure     that required stopping it _73 Patient unable to afford it _74 Patient no longer wants to      take this medication _75 Pregnancy _76 Other (specify: ) _77 Unknown Reason   If started or changed  Medication Name: _78 Canaglifozin (Invokana) _79 Dapagliflozin Wilder Glade) _80 Empaglifozin (Jardiance) _81 Ertugliflozin Actuary)           Medication Currently Prescribed? If started since last visit: If not started since last visit: If stopped since last visit:   GLP1 Receptor Agonist  _82 No >  _83 Yes > Since last visit, medication was: _84 Stopped _85 Not started _86 Started _87 Continued same medication _88 Continued with medication changes Date started:        / /             MM DD YYYY Who prescribed? _89 Cardiology provider  _90 Study clinic _91 Outside clinic _92 Endocrinology       provider _93 Primary care provider _94 Other provider > Specify:                             _95 Unknown Reason (check all that apply): _96 Personal or family  history of medullary thyroid cancer _0 MEN2 _1 HbA1c<7% on metformin monotherapy OR already on SGLT2i and do not need to start another anti-hyperglycemic _2 eGFR now <30 _3 High risk of Hypoglycemia _4 History of pancreatitis _5 Significant gastroparesis _6 Prior gastric surgery _7 Patient has experienced other side effects to this medication before _8 Patient will be unable to adhere/monitor _9 Patient unable to afford it _10 Patient does not want to take this medication _11 Pregnancy _12 Other (specify: ) _13 Unknown Reason Date discontinued:        / /             MM DD YYYY Reason (check all that apply): _14 Medullary thyroid cancer _15 MEN2 _16 eGFR now <30 _17 Hypoglycemia _18 Pancreatitis _19 Significant gastroparesis _20 Gastric surgery _21 Other medication side       effects _22   Patient  unable    to adhere/monitor                                  _23 Had an operation/procedure that required stopping it _24 Patient unable to afford it _25 Patient no longer wants to take this medication _26 Pregnancy _27 Other (specify: ) _28 Unknown Reason   If started or changed > Medication Name: _29 Albiglutide (Tanzeum) _30 Dulaglutide (Trulicity) <[id number]>_8<\/NIDPOEUMPNTIRWER>_15 Exanatide (Byetta, Bydureon) _32 Liraglutide (Victoza, Saxenda) _33 Lixisenatide (Adlyxin) _34 Semaglutice (Ozempic)      Medication Currently Prescribed? If started since last visit: If not started since last visit: If stopped since last visit:  Other non Insulin diabetes medications _35 No _36 Yes  > Medication Name (check all that apply): _37 Acarbose (Precose) _38 Miglitol (Glyset) _39 Glimepiride (Amaryl) _40 Glipizide (Amaryl) _41 Glyburide (Diabeta,       Glynase,   Micronase) _42 Metformin (Fortamet,        Glucophage[including XR],        Glumetza, Riomet) _43 Pioglitazone (Actos) _44 Nateglinide (Starlix) _45 Pramlintide (Symilin) _46 Repaglinide (Prandin) _47 Rosiglitazone (Avandia) _48 Alogliptin (Nesina) _49 Linagliptin (Tradjenta) _50 Saxagliptin (Onglyza) _51 Sitagliptin (Januvia) _52 Bromocriptine Quick Release (Cycloset)     Insulin _53 No _54  Yes > total daily dose: units     STATIN INTOLERANCE (PER EHR/OTHER SOURCE DATA)  1. Was CK checked? _55 No _56 Yes   >If yes, select from the following: _57 CK not elevated _58 CK elevated 1-5x upper limit of normal _59 CK elevated >5x upper limit of normal  2. Does the patient have muscle symptoms? _60 No _61 Yes    >If yes, select from the following: Location and pattern of muscle symptoms (select all that apply) _62 Symmetric, hip flexors or thighs _63 Symmetric, calves _64 Symmetric, proximal upper extremity _65 Asymmetric, intermittent, or not specific to any area _66 Unknown   Timing of muscle symptom in relation to starting statin regimen _67 <4 weeks _68 4-12 weeks _69 >12 weeks _70 Unknown   Timing of muscle symptoms improvement after withdrawal of statin _71 <2 weeks _72 2-4 weeks _73 No improvement after 4 weeks _74 Unknown  3. Was patient re-challenged with a statin regimen (even if same statin compound or regimen as above)?  _75 No  _76 Yes  _77 Unknown  >If yes, select from the following: Timing of recurrence of similar muscle symptoms in relation to starting second regimen _78 <4 weeks _79 4-12 weeks _80 >12 weeks _81 Similar symptoms did not recur _82 Unknown   [email or web address]

## 2020-03-20 ENCOUNTER — Other Ambulatory Visit: Payer: Self-pay | Admitting: Medical

## 2020-03-28 ENCOUNTER — Ambulatory Visit: Payer: PRIVATE HEALTH INSURANCE | Admitting: Medical

## 2020-04-02 ENCOUNTER — Telehealth: Payer: Self-pay

## 2020-04-02 NOTE — Telephone Encounter (Signed)
Received fax from [facility] pharmacy for a refill on th epts. Amlodipine, atorvastatin, and ventolin pt. last apt was 02/29/20 and next apt is 04/25/20.

## 2020-04-03 ENCOUNTER — Telehealth: Payer: Self-pay | Admitting: Internal Medicine

## 2020-04-03 ENCOUNTER — Other Ambulatory Visit: Payer: Self-pay | Admitting: Medical

## 2020-04-03 MED ORDER — ATORVASTATIN CALCIUM 80 MG PO TABS: 80.0000 mg | ORAL_TABLET | Freq: Every day | ORAL | 3 refills | Status: DC

## 2020-04-03 MED ORDER — ALBUTEROL SULFATE HFA 108 (90 BASE) MCG/ACT IN AERS: 2.0000 | INHALATION_SPRAY | Freq: Four times a day (QID) | RESPIRATORY_TRACT | 0 refills | Status: DC | PRN

## 2020-04-03 MED ORDER — AMLODIPINE BESYLATE 10 MG PO TABS: 10.0000 mg | ORAL_TABLET | Freq: Every day | ORAL | 0 refills | Status: DC

## 2020-04-03 NOTE — Telephone Encounter (Signed)
Per pt. She had to switch to [facility] pharmacy due to her ins.

## 2020-04-03 NOTE — Telephone Encounter (Signed)
*  STAT* If patient is at the pharmacy, call can be transferred to refill team.   1. Which medications need to be refilled? (please list name of each medication and dose if known) JARDIANCE 10 MG TABS tablet  2. Which pharmacy/location (including street and city if local pharmacy) is medication to be sent to? [facility] - [place], [place] - [address]  3. Do they need a 30 day or 90 day supply? 90 day

## 2020-04-03 NOTE — Telephone Encounter (Signed)
I sent the medications to [facility] requested  However prior prescription went to [facility] so is this correct?  Also the amlodipine was last refilled by cardiology and there was a note that she needed to call them for an appointment.  I did send the medicine to please have her call cardiology office to verify if she is due for an appointment or not

## 2020-04-05 ENCOUNTER — Telehealth: Payer: Self-pay | Admitting: Medical

## 2020-04-05 ENCOUNTER — Other Ambulatory Visit: Payer: Self-pay | Admitting: Medical

## 2020-04-05 MED ORDER — HYDROCHLOROTHIAZIDE 25 MG PO TABS: 25.0000 mg | ORAL_TABLET | Freq: Every day | ORAL | 2 refills | Status: DC

## 2020-04-05 NOTE — Telephone Encounter (Signed)
Pt needs a new prescription for hydrochlorothiazide sent to [facility] mail delivery

## 2020-04-05 NOTE — Telephone Encounter (Signed)
done

## 2020-04-09 MED ORDER — EMPAGLIFLOZIN 10 MG PO TABS: ORAL_TABLET | ORAL | 5 refills | Status: DC

## 2020-04-17 ENCOUNTER — Telehealth: Payer: Self-pay | Admitting: Internal Medicine

## 2020-04-17 NOTE — Telephone Encounter (Signed)
Spoke with patient about Shelly Blevins, per secure chat message received from [place] check-in colleague. Patient states she is unable to afford medication. Advised she apply for patient assistance. Will have application printed/left for pick up  [email or web address]

## 2020-04-22 ENCOUNTER — Other Ambulatory Visit: Payer: Self-pay | Admitting: Medical

## 2020-04-25 ENCOUNTER — Ambulatory Visit (INDEPENDENT_AMBULATORY_CARE_PROVIDER_SITE_OTHER): Payer: Medicare HMO | Admitting: Medical

## 2020-04-25 ENCOUNTER — Other Ambulatory Visit: Payer: Self-pay

## 2020-04-25 ENCOUNTER — Encounter: Payer: Self-pay | Admitting: Medical

## 2020-04-25 VITALS — BP 170/68 | HR 68 | Ht 63.0 in | Wt 186.4 lb

## 2020-04-25 DIAGNOSIS — I2510 Atherosclerotic heart disease of native coronary artery without angina pectoris: Secondary | ICD-10-CM

## 2020-04-25 DIAGNOSIS — Z780 Asymptomatic menopausal state: Secondary | ICD-10-CM

## 2020-04-25 DIAGNOSIS — Z7185 Encounter for immunization safety counseling: Secondary | ICD-10-CM

## 2020-04-25 DIAGNOSIS — I10 Essential (primary) hypertension: Secondary | ICD-10-CM | POA: Diagnosis not present

## 2020-04-25 DIAGNOSIS — Z794 Long term (current) use of insulin: Secondary | ICD-10-CM

## 2020-04-25 DIAGNOSIS — M25561 Pain in right knee: Secondary | ICD-10-CM

## 2020-04-25 DIAGNOSIS — M170 Bilateral primary osteoarthritis of knee: Secondary | ICD-10-CM

## 2020-04-25 DIAGNOSIS — Z7189 Other specified counseling: Secondary | ICD-10-CM

## 2020-04-25 DIAGNOSIS — E039 Hypothyroidism, unspecified: Secondary | ICD-10-CM

## 2020-04-25 DIAGNOSIS — G8929 Other chronic pain: Secondary | ICD-10-CM

## 2020-04-25 DIAGNOSIS — R413 Other amnesia: Secondary | ICD-10-CM

## 2020-04-25 DIAGNOSIS — Z23 Encounter for immunization: Secondary | ICD-10-CM | POA: Diagnosis not present

## 2020-04-25 DIAGNOSIS — E559 Vitamin D deficiency, unspecified: Secondary | ICD-10-CM

## 2020-04-25 DIAGNOSIS — E785 Hyperlipidemia, unspecified: Secondary | ICD-10-CM

## 2020-04-25 DIAGNOSIS — K5900 Constipation, unspecified: Secondary | ICD-10-CM

## 2020-04-25 DIAGNOSIS — M25562 Pain in left knee: Secondary | ICD-10-CM

## 2020-04-25 DIAGNOSIS — R270 Ataxia, unspecified: Secondary | ICD-10-CM

## 2020-04-25 DIAGNOSIS — I2583 Coronary atherosclerosis due to lipid rich plaque: Secondary | ICD-10-CM

## 2020-04-25 DIAGNOSIS — Z0000 Encounter for general adult medical examination without abnormal findings: Secondary | ICD-10-CM

## 2020-04-25 DIAGNOSIS — E119 Type 2 diabetes mellitus without complications: Secondary | ICD-10-CM

## 2020-04-25 DIAGNOSIS — K219 Gastro-esophageal reflux disease without esophagitis: Secondary | ICD-10-CM

## 2020-04-25 DIAGNOSIS — E2839 Other primary ovarian failure: Secondary | ICD-10-CM

## 2020-04-25 NOTE — Telephone Encounter (Addendum)
Faxed Jardiance appliance to [facility]

## 2020-04-25 NOTE — Progress Notes (Unsigned)
Subjective:    Shelly Blevins is a 75 y.o. female who presents for Preventative Services visit and chronic medical problems/med check visit.    Primary Care Provider Tysinger, Kermit Baloavid S, PA-C here for primary care  Current Health Care Team:  Dentist, None  Eye doctor, Dr. Alben SpittleWeaver  Dr. Dorisann FramesBindubal Balan, endocrinology  Dr. Zoila ShutterKenneth Hilty, cardiology  Dr. Yancey FlemingsJohn Perry, GI   Medical Services you may have received from other than [facility] providers in the past year (date may be approximate) endocrinology  Exercise Current exercise habits: The patient does not participate in regular exercise at present.   Nutrition/Diet Current diet: well balanced  Depression Screen [facility] 04/25/2020  Decreased Interest 0  Down, Depressed, Hopeless 0  PHQ - 2 Score 0    Activities of Daily Living Screen/Functional Status Survey Is the patient deaf or have difficulty hearing?: No Does the patient have difficulty seeing, even when wearing glasses/contacts?: No Does the patient have difficulty concentrating, remembering, or making decisions?: Yes (remembering) Does the patient have difficulty walking or climbing stairs?: Yes Does the patient have difficulty dressing or bathing?: No Does the patient have difficulty doing errands alone such as visiting a doctor's office or shopping?: No  Can patient draw a clock face - yes  Fall Risk Screen Fall Risk  04/25/2020 03/28/2019 02/09/2018 11/02/2016  Falls in the past year? 0 0 0 Yes  Number falls in past yr: - - - 1  Injury with Fall? - - - (No Data)  Comment - - - itch on on elbow  Risk for fall due to : No Fall Risks - - Impaired balance/gait  Follow up Falls evaluation completed - - -    Gait Assessment: Normal gait observed - yes  Advanced directives Does patient have a Health Care Power of Attorney? No Does patient have a Living Will? No  Past Medical History:  Diagnosis Date  . Allergy   . Arthritis   . CAD (coronary  artery disease)    mild nonobstructive disease, 30% Cfx lesion  . Cataract   . Diabetes mellitus    insulin-dependent; Dr. Talmage NapBalan  . Dyslipidemia   . GERD (gastroesophageal reflux disease)   . Hyperlipidemia   . Hypertension   . Hypothyroidism    Dr. Talmage NapBalan  . Stroke [facility](HCC)     Past Surgical History:  Procedure Laterality Date  . ABDOMINAL HYSTERECTOMY    . CARDIAC CATHETERIZATION  02/17/2007   mild CAD with 50% prox L Cfx stenosis, normal LV function (Dr. Claudia DesanctisH. Solomon)  . CHOLECYSTECTOMY    . EYE SURGERY    . NM MYOCAR PERF WALL MOTION  2009   lexiscan myoview - perfusion defect in anterior myociaral region (breast attenuation) - low risk scan - EF 69%  . THYROIDECTOMY    . TRANSTHORACIC ECHOCARDIOGRAM  2008   normal study     Social History   Socioeconomic History  . Marital status: Married    Spouse name: Not on file  . Number of children: 2  . Years of education: 10  . Highest education level: Not on file  Occupational History    Employer: [employer]  Tobacco Use  . Smoking status: Former Smoker    Quit date: 04/02/1986    Years since quitting: 34.0  . Smokeless tobacco: Never Used  Vaping Use  . Vaping Use: Never used  Substance and Sexual Activity  . Alcohol use: No  . Drug use: No  . Sexual  activity: Not on file  Other Topics Concern  . Not on file  Social History Narrative  . Not on file   Social Determinants of Health   Financial Resource Strain: Not on file  Food Insecurity: Not on file  Transportation Needs: Not on file  Physical Activity: Not on file  Stress: Not on file  Social Connections: Not on file  Intimate Partner Violence: Not on file    Family History  Problem Relation Age of Onset  . Heart failure Mother   . Diabetes Mother   . Sudden death Sister        died at 73  . Heart failure Brother        died at 25  . Colon cancer Neg Hx   . Esophageal cancer Neg Hx   . Stomach cancer Neg Hx   . Rectal cancer Neg Hx       Current Outpatient Medications:  .  albuterol (VENTOLIN HFA) 108 (90 Base) MCG/ACT inhaler, Inhale 2 puffs into the lungs every 6 (six) hours as needed for wheezing or shortness of breath. proair is covered by insurance, Disp: 3 each, Rfl: 0 .  amLODipine (NORVASC) 10 MG tablet, Take 1 tablet (10 mg total) by mouth daily., Disp: 90 tablet, Rfl: 0 .  aspirin EC 81 MG tablet, Take 1 tablet (81 mg total) by mouth daily., Disp: 90 tablet, Rfl: 3 .  atorvastatin (LIPITOR) 80 MG tablet, Take 1 tablet (80 mg total) by mouth daily., Disp: 90 tablet, Rfl: 3 .  BD PEN NEEDLE NANO 2ND GEN 32G X 4 MM MISC, 2 (two) times daily. as directed, Disp: , Rfl:  .  cholecalciferol (VITAMIN D3) 25 MCG (1000 UT) tablet, TAKE 1 TABLET BY MOUTH EVERY DAY, Disp: 90 tablet, Rfl: 3 .  empagliflozin (JARDIANCE) 10 MG TABS tablet, TAKE 1 TABLET BY MOUTH DAILY BEFORE BREAKFAST., Disp: 30 tablet, Rfl: 5 .  ezetimibe (ZETIA) 10 MG tablet, TAKE 1 TABLET BY MOUTH EVERY DAY, Disp: 30 tablet, Rfl: 11 .  hydrochlorothiazide (HYDRODIURIL) 25 MG tablet, Take 1 tablet (25 mg total) by mouth daily., Disp: 90 tablet, Rfl: 2 .  latanoprost (XALATAN) 0.005 % ophthalmic solution, Place 1 drop into both eyes at bedtime., Disp: , Rfl: 5 .  levothyroxine (SYNTHROID, LEVOTHROID) 50 MCG tablet, Take 50 mcg by mouth daily before breakfast., Disp: , Rfl:  .  Multiple Vitamins-Minerals (EMERGEN-C IMMUNE PLUS) PACK, Take 1 tablet by mouth 2 (two) times daily., Disp: 10 each, Rfl: 0 .  NOVOLOG MIX 70/30 FLEXPEN (70-30) 100 UNIT/ML FlexPen, Inject 60 Units into the skin 2 (two) times daily. , Disp: , Rfl: 3 .  omeprazole (PRILOSEC) 40 MG capsule, Take 40 mg by mouth every morning., Disp: , Rfl:  .  ONETOUCH VERIO test strip, 3 (three) times daily., Disp: , Rfl:  .  ramipril (ALTACE) 10 MG capsule, TAKE 1 CAPSULE BY MOUTH  DAILY, Disp: 90 capsule, Rfl: 0 .  SitaGLIPtin-MetFORMIN HCl 50-500 MG TB24, Take 1 tablet by mouth 2 (two) times daily., Disp:  , Rfl:  .  azithromycin (ZITHROMAX) 250 MG tablet, 2 tablets day 1, then 1 tablet days 2-4 (Patient not taking: No sig reported), Disp: 6 tablet, Rfl: 0 .  ibuprofen (ADVIL,MOTRIN) 200 MG tablet, Take 400 mg by mouth every 6 (six) hours as needed for headache or moderate pain.  (Patient not taking: Reported on 04/25/2020), Disp: , Rfl:  .  lidocaine (XYLOCAINE) 5 % ointment, Apply 1 application topically as  needed for mild pain.  (Patient not taking: Reported on 04/25/2020), Disp: , Rfl:  .  linaclotide (LINZESS) 72 MCG capsule, Take 1 capsule (72 mcg total) by mouth every other day. (Patient not taking: Reported on 04/25/2020), Disp: 16 capsule, Rfl: 0  No Known Allergies  History reviewed: allergies, current medications, past family history, past medical history, past social history, past surgical history and problem list  Chronic issues discussed: Hasn't been taking jardiance due to cost and insurance not kicked in on it.  Compliant with medications  Not checking BPs  Checking glucose but numbers up and down, no consistency  Ever since retiring 2021 early in the year, feels like she is just sitting around the house doing nothing.  Not happy about this.  Has some concerns with memory at times   Has ongoing chronic knee pain, arthritis,  Using OTC creams   Acute issues discussed: none  Objective:      Biometrics BP (!) 170/68   Pulse 68   Ht 5\' 3"  (1.6 m)   Wt 186 lb 6.4 oz (84.6 kg)   LMP  (LMP Unknown)   SpO2 96%   BMI 33.02 kg/m    Wt Readings from Last 3 Encounters:  04/25/20 186 lb 6.4 oz (84.6 kg)  02/29/20 181 lb (82.1 kg)  01/16/20 183 lb (83 kg)   BP Readings from Last 3 Encounters:  04/25/20 (!) 170/68  02/29/20 130/60  01/10/20 132/82     Cognitive Testing  Alert? Yes  Normal Appearance?Yes  Oriented to person? Yes  Place? Yes   Time? Yes  Recall of three objects?  No  Can perform simple calculations? Yes  Displays appropriate judgment?Yes  Can  read the correct time from a watch face?Yes  General appearance: alert, no distress, WD/WN, African American female  Nutritional Status: Inadequate calore intake? no Loss of muscle mass? no Loss of fat beneath skin? no Localized or general edema? no Diminished functional status? no  Other pertinent exam: HEENT: normocephalic, sclerae anicteric, TMs pearly, nares patent, no discharge or erythema, pharynx normal Oral cavity: MMM, no lesions Neck: supple, no lymphadenopathy, no thyromegaly, no masses, no bruits Heart: RRR, normal S1, S2, no murmurs Lungs: CTA bilaterally, no wheezes, rhonchi, or rales Abdomen: +bs, soft, non tender, non distended, no masses, no hepatomegaly, no splenomegaly Musculoskeletal: bilat generalized knee tenderness, nontender, no swelling, no obvious deformity Extremities: no edema, no cyanosis, no clubbing Pulses: 2+ symmetric, upper and lower extremities, normal cap refill Neurological: alert, oriented x 3, CN2-12 intact, strength normal upper extremities and lower extremities, sensation normal throughout, DTRs 2+ throughout, no cerebellar signs, gait normal Psychiatric: normal affect, behavior normal, pleasant     Assessment:   Encounter Diagnoses  Name Primary?  . Encounter for health maintenance examination in adult Yes  . Medicare annual wellness visit, subsequent   . Essential hypertension   . Osteoarthritis of both knees, unspecified osteoarthritis type   . Vitamin D deficiency   . Vaccine counseling   . Controlled type 2 diabetes mellitus without complication, with long-term current use of insulin ([place])   . Hypothyroidism, unspecified type   . Gastroesophageal reflux disease, unspecified whether esophagitis present   . Coronary artery disease due to lipid rich plaque   . Advanced directives, counseling/discussion   . Chronic pain of both knees   . Ataxia   . Constipation, unspecified constipation type   . Dyslipidemia   . Estrogen  deficiency   . Memory change   .  Post-menopausal      Plan:   A preventative services visit was completed today.  During the course of the visit today, we discussed and counseled about appropriate screening and preventive services.  A health risk assessment was established today that included a review of current medications, allergies, social history, family history, medical and preventative health history, biometrics, and preventative screenings to identify potential safety concerns or impairments.  A personalized plan was printed today for your records and use.   Personalized health advice and education was given today to reduce health risks and promote self management and wellness.  Information regarding end of life planning was discussed today.  Today you had a preventative care visit or wellness visit.    Topics today may have included healthy lifestyle, diet, exercise, preventative care, vaccinations, sick and well care, proper use of emergency dept and after hours care, as well as other concerns.     Recommendations: Continue to return yearly for your annual wellness and preventative care visits.  This gives [place] a chance to discuss healthy lifestyle, exercise, vaccinations, review your chart record, and perform screenings where appropriate.  I recommend you see your eye doctor yearly for routine vision care.  I recommend you see your dentist yearly for routine dental care including hygiene visits twice yearly.    Vaccination recommendations were reviewed  I recommend you call your insurance company to check on pricing and coverage to get the last shingles shot and update your tetanus shot  You are up-to-date on flu shot  You are updated on COVID vaccine  Your up-to-date on pneumococcal 13 vaccine  I recommend a pneumonia 23 vaccine which can be done every 5 years  Counseled on the pneumococcal vaccine.  Vaccine information sheet given.  Pneumococcal vaccine PPSV23 given  after consent obtained.     Screening for cancer: When people turn 50 or older we typically do not continue routine cancer screening.  This can vary from person to person though.  You have had mammogram and colonoscopy within the last 1 to 2 years so you are up-to-date.   Bone health: Get at least 150 minutes of aerobic exercise weekly Get weight bearing exercise at least once weekly  You are due for a bone density test.  Your last one was 2018.  Please call to schedule your bone density test.   [facility]  [phone] [place], [place] [address]     Heart health: Get at least 150 minutes of aerobic exercise weekly Limit alcohol It is important to maintain a healthy blood pressure and healthy cholesterol numbers   Separate significant issues discussed: I recommend you complete the advance directives which include living will and healthcare power of attorney.  If you need to update your last will and testament, then get a lawyer to do all these at the same time.  Otherwise have your granddaughter you mentioned help you to fill these out and have them notarized  High blood pressure-not at goal today.  We will look at your medications and cardiology notes and decide on a course of action  Diabetes-routine labs today but follow-up with Dr. Chalmers Cater soon for routine recheck  Hypothyroidism-labs today with follow-up with Dr. Chalmers Cater soon  I do recommend you check your blood pressure a few days per week and make sure it is staying less than 130/80.  You can get a blood pressure cuff from your local pharmacy  Continue to check  your blood sugars rarely with goal being less than 130 fasting in the morning  I recommend you continue a vitamin D supplement 1000 units daily  Lets consider neurology consult for evaluation for memory concerns  Chronic knee pain-I recommend using Aspercreme or capsaicin cream topically  over-the-counter.  Consider a neoprene knee sleeve or soft compressive sleeve over-the-counter for comfort.  If not improving we can have you follow-up with orthopedics  I recommend you do some volunteer work or find an activity you would enjoy doing regularly.  You could be such a benefit to people, encouraging people or being supportive such as volunteering at a school or nursing home    Noeline was seen today for [employer].  Diagnoses and all orders for this visit:  Encounter for health maintenance examination in adult -     Comprehensive metabolic panel -     CBC with Differential/Platelet -     Lipid panel -     Hemoglobin A1c -     Microalbumin/Creatinine Ratio, Urine -     TSH  Medicare annual wellness visit, subsequent  Essential hypertension -     Comprehensive metabolic panel  Osteoarthritis of both knees, unspecified osteoarthritis type  Vitamin D deficiency  Vaccine counseling  Controlled type 2 diabetes mellitus without complication, with long-term current use of insulin (HCC) -     Hemoglobin A1c -     Microalbumin/Creatinine Ratio, Urine  Hypothyroidism, unspecified type -     TSH  Gastroesophageal reflux disease, unspecified whether esophagitis present  Coronary artery disease due to lipid rich plaque  Advanced directives, counseling/discussion  Chronic pain of both knees  Ataxia  Constipation, unspecified constipation type  Dyslipidemia -     Lipid panel  Estrogen deficiency -     DG Bone Density; Future  Memory change  Post-menopausal -     DG Bone Density; Future  Other orders -     Pneumococcal polysaccharide vaccine 23-valent greater than or equal to 2yo subcutaneous/IM       f/u pending labs   Medicare Attestation A preventative services visit was completed today.  During the course of the visit the patient was educated and counseled about appropriate screening and preventive services.  A health risk assessment was  established with the patient that included a review of current medications, allergies, social history, family history, medical and preventative health history, biometrics, and preventative screenings to identify potential safety concerns or impairments.  A personalized plan was printed today for the patient's records and use.   Personalized health advice and education was given today to reduce health risks and promote self management and wellness.  Information regarding end of life planning was discussed today.  Dorothea Ogle, PA-C   04/26/2020

## 2020-04-25 NOTE — Patient Instructions (Signed)
A personalized plan was printed today for your records and use.   Personalized health advice and education was given today to reduce health risks and promote self management and wellness.  Information regarding end of life planning was discussed today.  Today you had a preventative care visit or wellness visit.    Topics today may have included healthy lifestyle, diet, exercise, preventative care, vaccinations, sick and well care, proper use of emergency dept and after hours care, as well as other concerns.     Recommendations: Continue to return yearly for your annual wellness and preventative care visits.  This gives [place] a chance to discuss healthy lifestyle, exercise, vaccinations, review your chart record, and perform screenings where appropriate.  I recommend you see your eye doctor yearly for routine vision care.  I recommend you see your dentist yearly for routine dental care including hygiene visits twice yearly.   Vaccination recommendations were reviewed  I recommend you call your insurance company to check on pricing and coverage to get the last shingles shot and update your tetanus shot  You are up-to-date on flu shot  You are updated on COVID vaccine  Your up-to-date on pneumococcal 13 vaccine  I recommend a pneumonia 23 vaccine which can be done every 5 years    Screening for cancer: When people turn 75 or older we typically do not continue routine cancer screening.  This can vary from person to person though.  You have had mammogram and colonoscopy within the last 1 to 2 years so you are up-to-date.   Bone health: Get at least 150 minutes of aerobic exercise weekly Get weight bearing exercise at least once weekly  You are due for a bone density test.  Your last one was 2018.  Please call to schedule your bone density test.   [facility]  [phone] [place], [place] [address]     Heart health: Get at  least 150 minutes of aerobic exercise weekly Limit alcohol It is important to maintain a healthy blood pressure and healthy cholesterol numbers   Separate significant issues discussed: I recommend you complete the advance directives which include living will and healthcare power of attorney.  If you need to update your last will and testament, then get a lawyer to do all these at the same time.  Otherwise have your granddaughter you mentioned help you to fill these out and have them notarized  High blood pressure-not at goal today.  We will look at your medications and cardiology notes and decide on a course of action  Diabetes-routine labs today but follow-up with Dr. Chalmers Cater soon for routine recheck  Hypothyroidism-labs today with follow-up with Dr. Chalmers Cater soon  I do recommend you check your blood pressure a few days per week and make sure it is staying less than 130/80.  You can get a blood pressure cuff from your local pharmacy  Continue to check your blood sugars rarely with goal being less than 130 fasting in the morning  I recommend you continue a vitamin D supplement 1000 units daily  Lets consider neurology consult for evaluation for memory concerns  Chronic knee pain-I recommend using Aspercreme or capsaicin cream topically over-the-counter.  Consider a neoprene knee sleeve or soft compressive sleeve over-the-counter for comfort.  If not improving we can have you follow-up with orthopedics  I recommend you do some volunteer work or find an activity you would enjoy doing regularly.  You could  be such a benefit to people, encouraging people or being supportive such as volunteering at a school or nursing home

## 2020-04-26 ENCOUNTER — Other Ambulatory Visit: Payer: Self-pay | Admitting: Medical

## 2020-04-26 LAB — CBC WITH DIFFERENTIAL/PLATELET
Basophils Absolute: 0 10*3/uL (ref 0.0–0.2)
Basos: 0 %
EOS (ABSOLUTE): 0.3 10*3/uL (ref 0.0–0.4)
Eos: 4 %
Hematocrit: 34.8 % (ref 34.0–46.6)
Hemoglobin: 11.6 g/dL (ref 11.1–15.9)
Immature Grans (Abs): 0 10*3/uL (ref 0.0–0.1)
Immature Granulocytes: 0 %
Lymphocytes Absolute: 3.1 10*3/uL (ref 0.7–3.1)
Lymphs: 36 %
MCH: 28.1 pg (ref 26.6–33.0)
MCHC: 33.3 g/dL (ref 31.5–35.7)
MCV: 84 fL (ref 79–97)
Monocytes Absolute: 0.6 10*3/uL (ref 0.1–0.9)
Monocytes: 7 %
Neutrophils Absolute: 4.5 10*3/uL (ref 1.4–7.0)
Neutrophils: 53 %
Platelets: 223 10*3/uL (ref 150–450)
RBC: 4.13 x10E6/uL (ref 3.77–5.28)
RDW: 13.9 % (ref 11.7–15.4)
WBC: 8.5 10*3/uL (ref 3.4–10.8)

## 2020-04-26 LAB — COMPREHENSIVE METABOLIC PANEL
ALT: 33 IU/L — ABNORMAL HIGH (ref 0–32)
AST: 34 IU/L (ref 0–40)
Albumin/Globulin Ratio: 1.3 (ref 1.2–2.2)
Albumin: 4.4 g/dL (ref 3.7–4.7)
Alkaline Phosphatase: 71 IU/L (ref 44–121)
BUN/Creatinine Ratio: 19 (ref 12–28)
BUN: 18 mg/dL (ref 8–27)
Bilirubin Total: 0.3 mg/dL (ref 0.0–1.2)
CO2: 27 mmol/L (ref 20–29)
Calcium: 9.1 mg/dL (ref 8.7–10.3)
Chloride: 104 mmol/L (ref 96–106)
Creatinine, Ser: 0.96 mg/dL (ref 0.57–1.00)
GFR calc Af Amer: 66 mL/min/{1.73_m2} (ref >59)
GFR calc non Af Amer: 58 mL/min/{1.73_m2} — ABNORMAL LOW (ref >59)
Globulin, Total: 3.4 g/dL (ref 1.5–4.5)
Glucose: 128 mg/dL — ABNORMAL HIGH (ref 65–99)
Potassium: 4.1 mmol/L (ref 3.5–5.2)
Sodium: 146 mmol/L — ABNORMAL HIGH (ref 134–144)
Total Protein: 7.8 g/dL (ref 6.0–8.5)

## 2020-04-26 LAB — MICROALBUMIN / CREATININE URINE RATIO
Creatinine, Urine: 129.5 mg/dL
Microalb/Creat Ratio: 73 mg/g creat — ABNORMAL HIGH (ref 0–29)
Microalbumin, Urine: 94.7 ug/mL

## 2020-04-26 LAB — LIPID PANEL
Chol/HDL Ratio: 2.6 ratio (ref 0.0–4.4)
Cholesterol, Total: 116 mg/dL (ref 100–199)
HDL: 45 mg/dL (ref >39)
LDL Chol Calc (NIH): 57 mg/dL (ref 0–99)
Triglycerides: 68 mg/dL (ref 0–149)
VLDL Cholesterol Cal: 14 mg/dL (ref 5–40)

## 2020-04-26 LAB — HEMOGLOBIN A1C
Est. average glucose Bld gHb Est-mCnc: 169 mg/dL
Hgb A1c MFr Bld: 7.5 % — ABNORMAL HIGH (ref 4.8–5.6)

## 2020-04-26 LAB — TSH: TSH: 1.46 u[IU]/mL (ref 0.450–4.500)

## 2020-04-26 MED ORDER — VITAMIN D 25 MCG (1000 UNIT) PO TABS: 1000.0000 [IU] | ORAL_TABLET | Freq: Every day | ORAL | 3 refills | Status: DC

## 2020-04-30 ENCOUNTER — Other Ambulatory Visit: Payer: Self-pay | Admitting: Medical

## 2020-04-30 DIAGNOSIS — Z780 Asymptomatic menopausal state: Secondary | ICD-10-CM

## 2020-04-30 DIAGNOSIS — E2839 Other primary ovarian failure: Secondary | ICD-10-CM

## 2020-05-01 NOTE — Telephone Encounter (Signed)
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

## 2020-05-06 ENCOUNTER — Other Ambulatory Visit: Payer: Self-pay | Admitting: Medical

## 2020-05-06 DIAGNOSIS — Z1231 Encounter for screening mammogram for malignant neoplasm of breast: Secondary | ICD-10-CM

## 2020-05-09 ENCOUNTER — Telehealth: Payer: Medicare HMO | Admitting: *Deleted

## 2020-05-09 ENCOUNTER — Other Ambulatory Visit: Payer: Medicare HMO

## 2020-05-09 ENCOUNTER — Other Ambulatory Visit: Payer: Self-pay

## 2020-05-09 NOTE — Telephone Encounter (Signed)
Patient was here for nurse visit/bp check. Her bp was 140/70.

## 2020-05-09 NOTE — Telephone Encounter (Signed)
Is this documented in a visit note or can you put in an encounter for documentation/metrics  This BP looks much better than last one

## 2020-05-09 NOTE — Telephone Encounter (Signed)
I did put the bp on the actual nurse visit as well. I let patient know her bp looked better.

## 2020-05-29 ENCOUNTER — Other Ambulatory Visit: Payer: Self-pay | Admitting: Medical

## 2020-06-10 ENCOUNTER — Other Ambulatory Visit: Payer: Self-pay | Admitting: Medical

## 2020-07-17 ENCOUNTER — Telehealth: Payer: Self-pay | Admitting: Internal Medicine

## 2020-07-17 NOTE — Telephone Encounter (Signed)
Spoke with patient, patient having trouble affording Jardiance 10mg  daily.   Per Recent telephone note 2/2/ patient needs updated Jardiance patient assistance program application as it was missing information.   Advised patient that samples of mg Jardiance- LOT: [id number] EXP: 9/23 Are at the front desk for pick up along with New application for patient assistance. Advised patient to fill out and return to office with needed information.  Patient aware and verbalized understanding

## 2020-07-17 NOTE — Telephone Encounter (Signed)
Pt c/o medication issue:  1. Name of Medication: empagliflozin (JARDIANCE) 10 MG TABS tablet  2. How are you currently taking this medication (dosage and times per day)? 1 tablet daily  3. Are you having a reaction (difficulty breathing--STAT)? no  4. What is your medication issue? Patient states the medication is $390 and she cannot afford it.

## 2020-08-08 ENCOUNTER — Encounter: Payer: Self-pay | Admitting: Internal Medicine

## 2020-08-08 ENCOUNTER — Ambulatory Visit: Payer: Medicare HMO | Admitting: Internal Medicine

## 2020-08-08 ENCOUNTER — Other Ambulatory Visit: Payer: Self-pay

## 2020-08-08 VITALS — BP 143/81 | HR 61 | Ht 62.0 in | Wt 186.6 lb

## 2020-08-08 DIAGNOSIS — I2583 Coronary atherosclerosis due to lipid rich plaque: Secondary | ICD-10-CM

## 2020-08-08 DIAGNOSIS — I2510 Atherosclerotic heart disease of native coronary artery without angina pectoris: Secondary | ICD-10-CM | POA: Diagnosis not present

## 2020-08-08 DIAGNOSIS — E1165 Type 2 diabetes mellitus with hyperglycemia: Secondary | ICD-10-CM | POA: Diagnosis not present

## 2020-08-08 DIAGNOSIS — I10 Essential (primary) hypertension: Secondary | ICD-10-CM | POA: Diagnosis not present

## 2020-08-08 DIAGNOSIS — E785 Hyperlipidemia, unspecified: Secondary | ICD-10-CM | POA: Diagnosis not present

## 2020-08-08 NOTE — Patient Instructions (Signed)
Medication Instructions:  Continue current medications  *If you need a refill on your cardiac medications before your next appointment, please call your pharmacy*   Lab Work: None Ordered   Testing/Procedures: None Ordered   Follow-Up: At [facility], you and your health needs are our priority.  As part of our continuing mission to provide you with exceptional heart care, we have created designated Provider Care Teams.  These Care Teams include your primary Cardiologist (physician) and Advanced Practice Providers (APPs -  Physician Assistants and Nurse Practitioners) who all work together to provide you with the care you need, when you need it.  We recommend signing up for the patient portal called "MyChart".  Sign up information is provided on this After Visit Summary.  MyChart is used to connect with patients for Virtual Visits (Telemedicine).  Patients are able to view lab/test results, encounter notes, upcoming appointments, etc.  Non-urgent messages can be sent to your provider as well.   To learn more about what you can do with MyChart, go to [email or web address].    Your next appointment:   6 month(s)  The format for your next appointment:   In Person  Provider:   You may see Kenneth C Hilty, MD or one of the following Advanced Practice Providers on your designated Care Team:    [name], PA-C  [name], PA-C or   Krista Kroeger, PA-C     

## 2020-08-08 NOTE — Progress Notes (Signed)
OFFICE NOTE  Chief Complaint:  Follow-up  Primary Care Physician: Carlena Hurl, PA-C  HPI:  Shelly Blevins is a 75 year old female with a history of coronary artery disease with mild nonobstructive coronary disease and 50% (not 30% as previously reported) proximal circumflex lesion. She also has diabetes, insulin dependent, hypertension, and d chest yslipidemia. There is an unfortunate sudden-death history in her family with her sister dying at age 77 and a brother who died of heart failure at 61. Unfortunately, her husband recently died and he had a lot of medical problems in November of 2013. She has had problems with hypothyroidism and was off her thyroid medication for some time but that has been reestablished. Telemetry she is noted some increase in shortness of breath with activities. 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 She also is describing some discomfort in the left upper chest area into the left shoulder. This is actually short-lived but sometimes is worse with exertion and relieved by rest. It is concerning to me for possible angina.  I saw Shelly Blevins back in the office today. Overall she is doing well. She denies any chest pain or worsening shortness of breath. At her last office visit she underwent a nuclear stress test which was negative for ischemia. Her diabetes is been fairly well controlled. She recently saw Dr. Chalmers Cater who is her endocrinologist. Blood pressure is at goal. Cholesterol is also followed by her endocrinologist.  Shelly Blevins returns today for follow-up. In general she is doing quite well. She denies any chest pain or worsening shortness of breath. She denies any palpitations. EKG appears stable. Diabetes is been fairly well controlled. She is on high-dose atorvastatin.  07/10/2016  [name] returns today for follow-up. Overall she seems to be doing well. Recently she's had some recurrent lightheadedness and  dizziness. She cause of vertigo however it's not clear that that's the issue. She has had some low blood sugars and her insulin was decreased somewhat. Since then her symptoms may been improved somewhat. Blood pressures recently well-controlled today.  03/15/2018  This is Shelly Blevins is seen today in follow-up.  I asked her how she was feeling she said she is not feeling all that well.  She says at times she gets short of breath and fatigued really easily.  She does have a history of mild nonobstructive coronary disease by cath in the past however recently has had some worsening exertional symptoms.  She underwent stress testing last about 4 years ago which was negative for ischemia.  EKG today shows sinus rhythm with poor R wave progression and a rate of 62-personally reviewed  05/03/2018  Shelly Blevins returns today for follow-up of her stress test.  This showed normal LV function with no reversible ischemia.  She reports she still gets short of breath with quick movements and significant exertion.  This could be related to some degree of diastolic dysfunction.  Blood pressure is a little bit elevated today.  02/15/2019  Shelly Blevins is seen today in follow-up.  Overall she is doing well.  She denies any worsening shortness of breath or chest pain.  Her most recent hemoglobin A1c was 8.3.  She does have moderate coronary disease.  She is potentially good candidate for the coordinate diabetes study.  She is already on insulin as well as Janumet XR.  Despite this, she has not been able to achieve hemoglobin A1c less than 7 and still  cannot seem to get her weight lower.  Blood pressure is well controlled today.   08/14/2019  Shelly Blevins is seen today in follow-up.  Overall she reports being under a lot of stress.  She has no specific complaints but occasionally gets some right-sided chest discomfort.  She says her diabetes is not been optimally controlled although her last A1c was 7.5.  She is followed closely by Dr.  Michiel Sites.  She has not had recent repeat lipids.  She was seen in January 2021 by Roby Lofts, PA-C who added ezetimibe.  She is not had repeat lipids since then.  08/08/2020  Shelly Blevins returns today for follow-up.  She again has some complaints concerning about fatigue.  She gets some right-sided chest discomfort as described again above.  EKG shows normal sinus rhythm without any ischemia.  A1c is not significantly improved and remains at 7.5 however her cholesterol now is at goal with LDL at 57.  PMHx:  Past Medical History:  Diagnosis Date  . Allergy   . Arthritis   . CAD (coronary artery disease)    mild nonobstructive disease, 30% Cfx lesion  . Cataract   . Diabetes mellitus    insulin-dependent; Dr. Chalmers Cater  . Dyslipidemia   . GERD (gastroesophageal reflux disease)   . Hyperlipidemia   . Hypertension   . Hypothyroidism    Dr. Chalmers Cater  . Stroke [facility])     Past Surgical History:  Procedure Laterality Date  . ABDOMINAL HYSTERECTOMY    . CARDIAC CATHETERIZATION  02/17/2007   mild CAD with 50% prox L Cfx stenosis, normal LV function (Dr. Norlene Duel)  . CHOLECYSTECTOMY    . EYE SURGERY    . NM MYOCAR PERF WALL MOTION  2009   lexiscan myoview - perfusion defect in anterior myociaral region (breast attenuation) - low risk scan - EF 69%  . THYROIDECTOMY    . TRANSTHORACIC ECHOCARDIOGRAM  2008   normal study     FAMHx:  Family History  Problem Relation Age of Onset  . Heart failure Mother   . Diabetes Mother   . Sudden death Sister        died at 51  . Heart failure Brother        died at 52  . Colon cancer Neg Hx   . Esophageal cancer Neg Hx   . Stomach cancer Neg Hx   . Rectal cancer Neg Hx     SOCHx:   reports that she quit smoking about 34 years ago. She has never used smokeless tobacco. She reports that she does not drink alcohol and does not use drugs.  ALLERGIES:  No Known Allergies  ROS: A comprehensive review of systems was negative.  HOME  MEDS: Current Outpatient Medications  Medication Sig Dispense Refill  . albuterol (VENTOLIN HFA) 108 (90 Base) MCG/ACT inhaler INHALE 2 PUFFS INTO THE LUNGS EVERY 6 (SIX) HOURS AS NEEDED FOR WHEEZING OR SHORTNESS OF BREATH. 3 each 0  . amLODipine (NORVASC) 10 MG tablet TAKE 1 TABLET EVERY DAY 90 tablet 0  . aspirin EC 81 MG tablet Take 1 tablet (81 mg total) by mouth daily. 90 tablet 3  . atorvastatin (LIPITOR) 80 MG tablet Take 1 tablet (80 mg total) by mouth daily. 90 tablet 3  . BD PEN NEEDLE NANO 2ND GEN 32G X 4 MM MISC 2 (two) times daily. as directed    . cholecalciferol (VITAMIN D3) 25 MCG (1000 UNIT) tablet Take 1 tablet (1,000 Units total) by  mouth daily. 90 tablet 3  . empagliflozin (JARDIANCE) 10 MG TABS tablet TAKE 1 TABLET BY MOUTH DAILY BEFORE BREAKFAST. 30 tablet 5  . ezetimibe (ZETIA) 10 MG tablet TAKE 1 TABLET BY MOUTH EVERY DAY 30 tablet 11  . hydrochlorothiazide (HYDRODIURIL) 25 MG tablet Take 1 tablet (25 mg total) by mouth daily. 90 tablet 2  . latanoprost (XALATAN) 0.005 % ophthalmic solution Place 1 drop into both eyes at bedtime.  5  . levothyroxine (SYNTHROID, LEVOTHROID) 50 MCG tablet Take 50 mcg by mouth daily before breakfast.    . Multiple Vitamins-Minerals (EMERGEN-C IMMUNE PLUS) PACK Take 1 tablet by mouth 2 (two) times daily. 10 each 0  . NOVOLOG MIX 70/30 FLEXPEN (70-30) 100 UNIT/ML FlexPen Inject 60 Units into the skin 2 (two) times daily.   3  . omeprazole (PRILOSEC) 40 MG capsule Take 40 mg by mouth every morning.    Glory Rosebush VERIO test strip 3 (three) times daily.    . ramipril (ALTACE) 10 MG capsule TAKE 1 CAPSULE BY MOUTH  DAILY 90 capsule 0  . SitaGLIPtin-MetFORMIN HCl 50-500 MG TB24 Take 1 tablet by mouth 2 (two) times daily.     No current facility-administered medications for this visit.    LABS/IMAGING: No results found for this or any previous visit (from the past 48 hour(s)). No results found.  VITALS: BP (!) 143/81   Pulse 61   Ht 5\' 2"   (1.575 m)   Wt 186 lb 9.6 oz (84.6 kg)   LMP  (LMP Unknown)   SpO2 96%   BMI 34.13 kg/m   EXAM: General appearance: alert and no distress Neck: no carotid bruit, no JVD and thyroid not enlarged, symmetric, no tenderness/mass/nodules Lungs: clear to auscultation bilaterally Heart: regular rate and rhythm, S1, S2 normal, no murmur, click, rub or gallop Abdomen: soft, non-tender; bowel sounds normal; no masses,  no organomegaly Extremities: extremities normal, atraumatic, no cyanosis or edema Pulses: 2+ and symmetric Skin: Skin color, texture, turgor normal. No rashes or lesions Neurologic: Grossly normal Psych: Pleasant  EKG: Normal sinus rhythm at 61-personally reviewed  ASSESSMENT: 1. History of fatigue, chest tightness and dyspnea -low risk Myoview stress test, normal LV function  (03/2018) 2. Moderate single vessel coronary disease with a 50% circumflex lesion and 2009 - negative nuclear stress test in 2015 3. Insulin-dependent diabetes, A1c 7.5 4. Hypertension  5. Dyslipidemia, goal LDL<70 6. Family history of premature sudden cardiac death 16-Jul-2022. Dyspnea/chest pain  PLAN: 1.   Mrs. Stage reports some fatigue however notes it may be a little better than it was in the past.  She denies any chest pain.  A1c is 7.5 and needs further improvement.  She does see Dr. Michiel Sites.  Cholesterol is at target.  No medication changes today.  Follow-up with me in 6 months or sooner as necessary.  Pixie Casino, MD, [facility], [place] Director of the Advanced Lipid Disorders &  Cardiovascular Risk Reduction Clinic Diplomate of the American Board of Clinical Lipidology Attending Cardiologist  Direct Dial: [phone]  Fax: [phone]  Website:  [email or web address] Raeghan Demeter 08/08/2020, 3:20 PM

## 2020-08-12 ENCOUNTER — Telehealth: Payer: Self-pay | Admitting: Internal Medicine

## 2020-08-12 NOTE — Telephone Encounter (Signed)
Jardiance patient assistance application faxed to [facility] @ [phone]

## 2020-08-15 NOTE — Telephone Encounter (Signed)
Left message for patient that assistance application was deneid Provided information as noted below, from Westley Hummer, [employer] [place] [phone]. When she calls she needs to ask for the [facility] program or Willey Blade. Once she's gotten them she just needs to ask for assistance with enrolling in Extra Help

## 2020-08-19 ENCOUNTER — Telehealth: Payer: Self-pay | Admitting: Licensed Clinical Social Worker

## 2020-08-19 NOTE — Telephone Encounter (Signed)
LCSW called and reached pt this afternoon at [phone]. Introduced self, role, reason for call. Pt shares that she has not yet called [employer] or looked into Extra Help. She had to leave town and head to [place] as her sister was brought home on hospice. LCSW shared that our thoughts are with pt and her family- provided my name and number and encouraged her to reach out to me as needed when she returns home and either I can assist with answering questions regarding Extra Help (low income subsidy) or connect her with [facility] counselor.   Westley Hummer, MSW, [place]  [phone]

## 2020-08-28 ENCOUNTER — Other Ambulatory Visit: Payer: Self-pay | Admitting: Medical

## 2020-08-28 NOTE — Telephone Encounter (Signed)
Does pt need to get bp med filled from Cardiology

## 2020-09-17 ENCOUNTER — Other Ambulatory Visit: Payer: Self-pay

## 2020-09-17 ENCOUNTER — Ambulatory Visit
Admission: RE | Admit: 2020-09-17 | Discharge: 2020-09-17 | Disposition: A | Discharge status: Home / Self Care | Payer: Medicare HMO | Source: Ambulatory Visit | Attending: Medical | Admitting: Medical

## 2020-09-17 DIAGNOSIS — Z780 Asymptomatic menopausal state: Secondary | ICD-10-CM

## 2020-09-17 DIAGNOSIS — E2839 Other primary ovarian failure: Secondary | ICD-10-CM

## 2020-09-19 ENCOUNTER — Telehealth: Payer: Self-pay | Admitting: Licensed Clinical Social Worker

## 2020-09-19 ENCOUNTER — Telehealth: Payer: Self-pay | Admitting: Internal Medicine

## 2020-09-19 NOTE — Telephone Encounter (Signed)
Pt is reaching out stating that her Jardiance medication is too expensive, shed like to know if there is an alternative med that is more affordable.. please advise.

## 2020-09-19 NOTE — Telephone Encounter (Signed)
Called to see if pt interested in assistance with Extra Help application.  Was able to reach her at [phone]. Re-introduced self, role, reason for call. Pt does recall previous conversations with this Probation officer and Clarnce Flock., RN about medication assistance but did not call [employer] line to be screened. LCSW willing to assist pt with this application. Pt currently at bank, we have agreed to speak at [place], MSW, [place]  [phone]

## 2020-09-19 NOTE — Telephone Encounter (Addendum)
Pt completed Extra Help application with this Probation officer. However due to her current assets she likely does not qualify for that assistance, pt currently has no Jardiance. Will route for potential sample assistance until provider/RN are able to make additional recommendations. Unfortunately, no additional assistance recommendations at this time.   Westley Hummer, MSW, [place]  [phone]

## 2020-09-19 NOTE — Telephone Encounter (Signed)
Jardiance 10 mg samples placed up front for patient.  Called, LVM advising patient of this, will route to RN to advise when she returns.   Dr.Hilty- we may need to switch her just wanted to give you a heads up on this as well.  Thanks!

## 2020-09-19 NOTE — Progress Notes (Signed)
Heart and Vascular Care Navigation  09/19/2020  Shelly Blevins 05/09/1945 [id number]  Reason for Referral:  Engaged with patient by telephone for follow up visit for Heart and Vascular Care Coordination.                                                                                                    Assessment:          LCSW called pt again to completed Extra Help application.  I introduced self, role, reason for call again. Confirmed home address, pt lives with several family members and retired in 2021. I explained the Extra Help application. We completed the screening tool and pt appeared to qualify however once we completed full application it appears pt assets may financially make her ineligible at this time. Pt shares she thinks she may have applied before and been denied previously. LCSW completed application, printed the reference sheets and mailed to pt. She has my number for any further questions. Pt currently out of Jardiance- I shared I would reach out to RN/LPN team to see what next steps pt should take/to potentially provide samples.                              HRT/VAS Care Coordination     Patients Home Cardiology Office [place] Team Social Worker   Social Worker Name: Valeda Malm, [facility] [phone]   Living arrangements for the past 2 months Single Family Home   Lives with: Relatives; Siblings   Patient Current Insurance Coverage Managed Medicare   Patient Has Concern With Paying Medical Bills No   Does Patient Have Prescription Coverage? Yes   Patient Prescription Assistance Programs Other   Other Assistance Programs Medications Jardiance- Extra Help app completed   Home Assistive Devices/Equipment None       Social History:                                                                             SDOH Screenings   Alcohol Screen: Not on file  Depression (PHQ2-9): Low Risk    PHQ-2 Score: 0  Financial  Resource Strain: Low Risk    Difficulty of Paying Living Expenses: Not very hard  Food Insecurity: No Food Insecurity   Worried About Charity fundraiser in the Last Year: Never true   Ran Out of Food in the Last Year: Never true  Housing: Low Risk    Last Housing Risk Score: 0  Physical Activity: Not on file  Social Connections: Not on file  Stress: Not on file  Tobacco Use: Medium Risk   Smoking Tobacco Use: Former   Smokeless Tobacco Use: Never  Transportation Needs: No Transportation Needs  Lack of Transportation (Medical): No   Lack of Transportation (Non-Medical): No    SDOH Interventions: Financial Resources:  Financial Strain Interventions: Other (Comment) (Extra Help application completed)   Food Insecurity:  Food Insecurity Interventions: Intervention Not Indicated  Housing Insecurity:  Housing Interventions: Intervention Not Indicated  Transportation:   Transportation Interventions: Intervention Not Indicated   Other Care Navigation Interventions:     Provided Pharmacy assistance resources Other- extra help application   Follow-up plan:   LCSW alerted triage team, have routed pt call back to triage to assist with samples/support until additional recommendations made, no additional assistance program options available at this time.

## 2020-09-19 NOTE — Telephone Encounter (Signed)
Francee Piccolo, it looks like you and Eliezer Lofts were working with this patient on getting some assistance, Eliezer Lofts is out this week so just wanted to see if you knew anything about this.  Thanks!

## 2020-09-20 NOTE — Telephone Encounter (Signed)
Thanks for notifying me!

## 2020-09-23 NOTE — Telephone Encounter (Signed)
Is Farxiga 10 mg daily covered?  Dr. Lemmie Evens

## 2020-10-03 NOTE — Telephone Encounter (Signed)
Attempted to call patient to see if she can check with insurance to see if Wilder Glade may be covered better than Jardiance since it seems she may not qualify for patient assistance programs for medicare LIS  Left message

## 2020-10-04 NOTE — Telephone Encounter (Signed)
Pt called back and stated that her insurance company agreed to cover the medicine and her copay is $47.00. please advise pt further

## 2020-10-04 NOTE — Telephone Encounter (Signed)
Left message for pt to call to confirm preferred pharmacy.

## 2020-10-10 ENCOUNTER — Telehealth: Payer: Self-pay | Admitting: Internal Medicine

## 2020-10-10 NOTE — Telephone Encounter (Signed)
Spoke with patient who reports Dr. Chalmers Cater was supposed to send [place] a note about Farxiga 10mg . She said Dr. Chalmers Cater was supposed to send a prescription in for her but she is unsure what it is She also states she uses [facility] mail order pharmacy.   She also asked if she is on too many meds for diabetes. Explained that jardiance and farxiga are diabetes meds but also have cardiovascular indications, which is why Dr. Debara Pickett prescribed. Advised she consult with Dr. Chalmers Cater regarding how her meds are impacting diabetes  She called in to triage 7/8 -- but did not provide name of medication and just called back today Shelly Blevins   12:21 PM Note Pt called back and stated that her insurance company agreed to cover the medicine and her copay is $47.00. please advise pt further     Explained to patient we will need to get notes from Dr. Chalmers Cater so we are not duplicating therapies

## 2020-10-10 NOTE — Telephone Encounter (Signed)
Pt is calling to confirm her pharmacy is suppose to be [facility] order.

## 2020-10-25 MED ORDER — EMPAGLIFLOZIN 10 MG PO TABS: ORAL_TABLET | ORAL | 3 refills | Status: DC

## 2020-10-25 NOTE — Telephone Encounter (Signed)
Received note from Dr. Chalmers Cater. This note said to continue jardiance '10mg'$ .   Will need to clarify with patient if she has been taking this medication or taking farxiga.   Per Hilty MD - should try to continue with jardiance as previously prescribed and as endocrinology advised  Left message for patient to call back to speak about this

## 2020-10-25 NOTE — Telephone Encounter (Signed)
Patient returned call - she said she changed to [facility] Medicare and Jardiance should be covered. Rx(s) sent to pharmacy electronically to [facility] per request.   Will send message to [facility] for assistance with PA

## 2020-10-25 NOTE — Addendum Note (Signed)
Addended by: Fidel Levy on: 10/25/2020 02:43 PM   Modules accepted: Orders

## 2020-11-05 NOTE — Telephone Encounter (Signed)
No PA request received to date from insurance company/pharmacy

## 2020-11-11 ENCOUNTER — Telehealth: Payer: Self-pay | Admitting: Internal Medicine

## 2020-11-11 NOTE — Telephone Encounter (Signed)
MD portion of Jardiance assistance application completed/signed. Left at front desk for patient to pick up. She was notified.

## 2020-11-25 ENCOUNTER — Ambulatory Visit (INDEPENDENT_AMBULATORY_CARE_PROVIDER_SITE_OTHER): Payer: Medicare Other | Admitting: Medical

## 2020-11-25 ENCOUNTER — Encounter: Payer: Self-pay | Admitting: Medical

## 2020-11-25 ENCOUNTER — Other Ambulatory Visit: Payer: Self-pay

## 2020-11-25 VITALS — BP 124/72 | HR 66 | Wt 187.6 lb

## 2020-11-25 DIAGNOSIS — E119 Type 2 diabetes mellitus without complications: Secondary | ICD-10-CM

## 2020-11-25 DIAGNOSIS — E559 Vitamin D deficiency, unspecified: Secondary | ICD-10-CM

## 2020-11-25 DIAGNOSIS — Z23 Encounter for immunization: Secondary | ICD-10-CM | POA: Diagnosis not present

## 2020-11-25 DIAGNOSIS — R4589 Other symptoms and signs involving emotional state: Secondary | ICD-10-CM | POA: Diagnosis not present

## 2020-11-25 DIAGNOSIS — E039 Hypothyroidism, unspecified: Secondary | ICD-10-CM

## 2020-11-25 DIAGNOSIS — R5382 Chronic fatigue, unspecified: Secondary | ICD-10-CM | POA: Diagnosis not present

## 2020-11-25 DIAGNOSIS — Z794 Long term (current) use of insulin: Secondary | ICD-10-CM

## 2020-11-25 DIAGNOSIS — I2510 Atherosclerotic heart disease of native coronary artery without angina pectoris: Secondary | ICD-10-CM

## 2020-11-25 DIAGNOSIS — D649 Anemia, unspecified: Secondary | ICD-10-CM

## 2020-11-25 DIAGNOSIS — I10 Essential (primary) hypertension: Secondary | ICD-10-CM

## 2020-11-25 DIAGNOSIS — I2583 Coronary atherosclerosis due to lipid rich plaque: Secondary | ICD-10-CM

## 2020-11-25 NOTE — Progress Notes (Signed)
Subjective:  Shelly Blevins is a 75 y.o. female who presents for Chief Complaint  Patient presents with   Depression    Depression, had sister that died in September 27, 2022 and took a toll on her.     Here today for fatigue, decreased energy and change in mood.  She feels down her mood.  Sister died in Sep 27, 2022.   Saw her in 2022-08-27 and stayed there with her helping for weeks until she passed.   She had lung cancer, and she continued to smoke.   It took a toll on her.  She notes that lately she does not have a lot of energy or desire to do a whole lot.  She has other family that live with her including 2 grandchildren and 2 great-grandchildren.  She helps watch after them that she still feels like she needs something else to do.  When she used to work she never had this problem but since she retired a year ago she at times feels bored at other times feels down and depressed.  One of the grandchildren that she looks after has autism and this particular situation is very stressful to her.  She also feels decreased energy.  Just walking across the room sometimes takes a lot out of her and feels a little short of breath.  No chest pain no edema.  No palpitations of the heart.  She is compliant with her medications.  Hypertension-she is compliant with her medications and blood pressures at home are normal  Diabetes-she is having a hard time getting Jardiance and Janumet covered by insurance.  She is on Novolog 60u and 10u lunch , 60 afternoon.  She sees diabetes specialist   No other aggravating or relieving factors. No other complaint.  Past Medical History:  Diagnosis Date   Allergy    Arthritis    CAD (coronary artery disease)    mild nonobstructive disease, 30% Cfx lesion   Cataract    Diabetes mellitus    insulin-dependent; Dr. Chalmers Cater   Dyslipidemia    GERD (gastroesophageal reflux disease)    Hyperlipidemia    Hypertension    Hypothyroidism    Dr. Chalmers Cater   Stroke [facility])    Current Outpatient  Medications on File Prior to Visit  Medication Sig Dispense Refill   albuterol (VENTOLIN HFA) 108 (90 Base) MCG/ACT inhaler INHALE 2 PUFFS INTO THE LUNGS EVERY 6 (SIX) HOURS AS NEEDED FOR WHEEZING OR SHORTNESS OF BREATH. 3 each 0   amLODipine (NORVASC) 10 MG tablet TAKE 1 TABLET EVERY DAY 90 tablet 0   aspirin EC 81 MG tablet Take 1 tablet (81 mg total) by mouth daily. 90 tablet 3   atorvastatin (LIPITOR) 80 MG tablet Take 1 tablet (80 mg total) by mouth daily. 90 tablet 3   BD PEN NEEDLE NANO 2ND GEN 32G X 4 MM MISC 2 (two) times daily. as directed     cholecalciferol (VITAMIN D3) 25 MCG (1000 UNIT) tablet Take 1 tablet (1,000 Units total) by mouth daily. 90 tablet 3   ezetimibe (ZETIA) 10 MG tablet TAKE 1 TABLET BY MOUTH EVERY DAY 30 tablet 11   hydrochlorothiazide (HYDRODIURIL) 25 MG tablet Take 1 tablet (25 mg total) by mouth daily. 90 tablet 2   latanoprost (XALATAN) 0.005 % ophthalmic solution Place 1 drop into both eyes at bedtime.  5   levothyroxine (SYNTHROID, LEVOTHROID) 50 MCG tablet Take 50 mcg by mouth daily before breakfast.     metFORMIN (GLUCOPHAGE-XR) 500 MG 24  hr tablet Take 2 tablets by mouth 2 (two) times daily.     Multiple Vitamins-Minerals (EMERGEN-C IMMUNE PLUS) PACK Take 1 tablet by mouth 2 (two) times daily. 10 each 0   NOVOLOG MIX 70/30 FLEXPEN (70-30) 100 UNIT/ML FlexPen Inject 60 Units into the skin 2 (two) times daily.   3   omeprazole (PRILOSEC) 40 MG capsule Take 40 mg by mouth every morning.     ONETOUCH VERIO test strip 3 (three) times daily.     ramipril (ALTACE) 10 MG capsule TAKE 1 CAPSULE BY MOUTH  DAILY 90 capsule 0   Vitamin D, Ergocalciferol, (DRISDOL) 1.25 MG (50000 UNIT) CAPS capsule Take by mouth.     No current facility-administered medications on file prior to visit.     The following portions of the patient's history were reviewed and updated as appropriate: allergies, current medications, past family history, past medical history, past social  history, past surgical history and problem list.  ROS Otherwise as in subjective above    Objective: BP 124/72   Pulse 66   Wt 187 lb 9.6 oz (85.1 kg)   LMP  (LMP Unknown)   BMI 34.31 kg/m   Wt Readings from Last 3 Encounters:  11/25/20 187 lb 9.6 oz (85.1 kg)  08/08/20 186 lb 9.6 oz (84.6 kg)  04/25/20 186 lb 6.4 oz (84.6 kg)    General appearance: alert, no distress, well developed, well nourished HEENT: normocephalic, sclerae anicteric, conjunctiva pink and moist, TMs pearly, nares patent, no discharge or erythema, pharynx normal Oral cavity: MMM, no lesions Neck: supple, no lymphadenopathy, no thyromegaly, no masses, no JVD or bruit Heart: RRR, normal S1, S2, no murmurs Lungs: CTA bilaterally, no wheezes, rhonchi, or rales Pulses: 2+ radial pulses, 2+ pedal pulses, normal cap refill Ext: no edema Psych: Pleasant but seems down her mood, good eye contact, answers questions appropriately    Assessment: Encounter Diagnoses  Name Primary?   Depressed mood Yes   Chronic fatigue    Needs flu shot    Vitamin D deficiency    Hypothyroidism, unspecified type    Essential hypertension    Controlled type 2 diabetes mellitus without complication, with long-term current use of insulin ([place])    Coronary artery disease due to lipid rich plaque      Plan: We discussed her symptoms and concerns.  She mentioned some things about fatigue and shortness of breath and not having energy but after discussion I think this has more to do with her mood.  Of note vital signs stable today.  We walked around the office for a little while and her oxygen never had a desaturation, and she did not seem short of breath  Nevertheless we will check some labs below.  Her labs earlier in the year were normal for routine comprehensive lab monitoring  She has hypothyroidism but is compliant with medication as well as compliant with her medicines for blood pressure and diabetes.  I strongly  recommended counseling.  We discussed ways to volunteer and to be active with meaningful activities.  Referral to [facility] for counseling.  Hypertension-continue current medication  Hyperlipidemia-continue current medication  Hypothyroidism-continue current medication  Counseled on the influenza virus vaccine.  Vaccine information sheet given.   High dose Influenza vaccine given after consent obtained.  Zunairah was seen today for depression.  Diagnoses and all orders for this visit:  Depressed mood -     Basic metabolic panel -     TSH -  CBC  Chronic fatigue -     Basic metabolic panel -     TSH -     CBC  Needs flu shot -     Flu Vaccine QUAD High Dose(Fluad)  Vitamin D deficiency  Hypothyroidism, unspecified type -     TSH  Essential hypertension -     Basic metabolic panel  Controlled type 2 diabetes mellitus without complication, with long-term current use of insulin (HCC)  Coronary artery disease due to lipid rich plaque   Follow up: Pending labs

## 2020-11-26 ENCOUNTER — Ambulatory Visit: Payer: Medicare HMO | Admitting: Medical

## 2020-11-26 ENCOUNTER — Other Ambulatory Visit: Payer: Self-pay

## 2020-11-26 DIAGNOSIS — Z1211 Encounter for screening for malignant neoplasm of colon: Secondary | ICD-10-CM

## 2020-11-26 LAB — BASIC METABOLIC PANEL
BUN/Creatinine Ratio: 17 (ref 12–28)
BUN: 18 mg/dL (ref 8–27)
CO2: 22 mmol/L (ref 20–29)
Calcium: 9.9 mg/dL (ref 8.7–10.3)
Chloride: 98 mmol/L (ref 96–106)
Creatinine, Ser: 1.03 mg/dL — ABNORMAL HIGH (ref 0.57–1.00)
Glucose: 180 mg/dL — ABNORMAL HIGH (ref 65–99)
Potassium: 4.1 mmol/L (ref 3.5–5.2)
Sodium: 139 mmol/L (ref 134–144)
eGFR: 56 mL/min/{1.73_m2} — ABNORMAL LOW (ref >59)

## 2020-11-26 LAB — CBC
Hematocrit: 32.3 % — ABNORMAL LOW (ref 34.0–46.6)
Hemoglobin: 10.7 g/dL — ABNORMAL LOW (ref 11.1–15.9)
MCH: 28.6 pg (ref 26.6–33.0)
MCHC: 33.1 g/dL (ref 31.5–35.7)
MCV: 86 fL (ref 79–97)
Platelets: 217 10*3/uL (ref 150–450)
RBC: 3.74 x10E6/uL — ABNORMAL LOW (ref 3.77–5.28)
RDW: 15.1 % (ref 11.7–15.4)
WBC: 7 10*3/uL (ref 3.4–10.8)

## 2020-11-26 LAB — TSH: TSH: 1.57 u[IU]/mL (ref 0.450–4.500)

## 2020-11-28 LAB — FERRITIN: Ferritin: 99 ng/mL (ref 15–150)

## 2020-11-28 LAB — SPECIMEN STATUS REPORT

## 2020-11-28 LAB — IRON AND TIBC
Iron Saturation: 25 % (ref 15–55)
Iron: 72 ug/dL (ref 27–139)
Total Iron Binding Capacity: 291 ug/dL (ref 250–450)
UIBC: 219 ug/dL (ref 118–369)

## 2020-11-28 NOTE — Telephone Encounter (Signed)
Left message for patient that [employer] needs proof of income before application can be processed  Provided phone # [phone]

## 2020-11-30 ENCOUNTER — Other Ambulatory Visit: Payer: Self-pay | Admitting: Medical

## 2020-11-30 ENCOUNTER — Other Ambulatory Visit: Payer: Self-pay | Admitting: Internal Medicine

## 2020-12-10 LAB — FECAL OCCULT BLOOD, IMMUNOCHEMICAL
Fecal Occult Bld: NEGATIVE
IFOBT: NEGATIVE

## 2020-12-11 ENCOUNTER — Encounter: Payer: Self-pay | Admitting: Internal Medicine

## 2020-12-20 ENCOUNTER — Telehealth: Payer: Self-pay

## 2020-12-20 DIAGNOSIS — I739 Peripheral vascular disease, unspecified: Secondary | ICD-10-CM

## 2020-12-20 NOTE — Telephone Encounter (Signed)
Placed vascular order

## 2020-12-20 NOTE — Telephone Encounter (Signed)
A nurse from house call through the pts. Ins. Called to let us know she had a abnormal finding. Her PAD screening in her lt. Foot was 0.79.

## 2020-12-20 NOTE — Addendum Note (Signed)
Addended by: Minette Headland A on: 12/20/2020 04:08 PM   Modules accepted: Orders

## 2020-12-27 ENCOUNTER — Other Ambulatory Visit: Payer: Self-pay

## 2020-12-27 ENCOUNTER — Ambulatory Visit
Admission: RE | Admit: 2020-12-27 | Discharge: 2020-12-27 | Disposition: A | Discharge status: Home / Self Care | Payer: Medicare Other | Source: Ambulatory Visit | Attending: Medical | Admitting: Medical

## 2020-12-27 DIAGNOSIS — Z1231 Encounter for screening mammogram for malignant neoplasm of breast: Secondary | ICD-10-CM

## 2020-12-29 ENCOUNTER — Other Ambulatory Visit: Payer: Self-pay | Admitting: Medical

## 2020-12-31 ENCOUNTER — Encounter: Payer: Self-pay | Admitting: Medical

## 2020-12-31 ENCOUNTER — Other Ambulatory Visit: Payer: Self-pay

## 2020-12-31 ENCOUNTER — Ambulatory Visit (INDEPENDENT_AMBULATORY_CARE_PROVIDER_SITE_OTHER): Payer: Medicare Other | Admitting: Medical

## 2020-12-31 VITALS — BP 120/64 | HR 69 | Wt 198.2 lb

## 2020-12-31 DIAGNOSIS — R6889 Other general symptoms and signs: Secondary | ICD-10-CM | POA: Diagnosis not present

## 2020-12-31 DIAGNOSIS — Z794 Long term (current) use of insulin: Secondary | ICD-10-CM

## 2020-12-31 DIAGNOSIS — E119 Type 2 diabetes mellitus without complications: Secondary | ICD-10-CM | POA: Insufficient documentation

## 2020-12-31 DIAGNOSIS — L602 Onychogryphosis: Secondary | ICD-10-CM | POA: Diagnosis not present

## 2020-12-31 DIAGNOSIS — R0989 Other specified symptoms and signs involving the circulatory and respiratory systems: Secondary | ICD-10-CM | POA: Diagnosis not present

## 2020-12-31 DIAGNOSIS — E1169 Type 2 diabetes mellitus with other specified complication: Secondary | ICD-10-CM

## 2020-12-31 DIAGNOSIS — M25571 Pain in right ankle and joints of right foot: Secondary | ICD-10-CM

## 2020-12-31 DIAGNOSIS — Z6836 Body mass index (BMI) 36.0-36.9, adult: Secondary | ICD-10-CM | POA: Insufficient documentation

## 2020-12-31 DIAGNOSIS — M25572 Pain in left ankle and joints of left foot: Secondary | ICD-10-CM

## 2020-12-31 NOTE — Progress Notes (Signed)
Subjective:  Shelly Blevins is a 75 y.o. female who presents for Chief Complaint  Patient presents with   Leg Pain    Tingling pain in left shin on and off first noticed a few months ago   Here for check on screening she had at home.  Her insurance and the home health nurse out and they states she had decreased pulses in her feet.  The home health nurse called our office which we referred to vascular surgery.  She has not had her appointment with vascular.  She denies regular exercise.  She does not walk very far.  Although she has no pain with walking in her legs she does note that she gets some tingling in her left lower leg anteriorly.  No weakness no falls.  Recently she had a lot of pain in her feet preventing him from walking much at all.  She went to the good feet store and had special inserts made which has been helpful.  She is not pain-free today.  She is frustrated with her weight.  Otherwise she has no other considerable complaints today  No other aggravating or relieving factors.    No other c/o.  Marland Kitchen Past Medical History:  Diagnosis Date   Allergy    Arthritis    CAD (coronary artery disease)    mild nonobstructive disease, 30% Cfx lesion   Cataract    Diabetes mellitus    insulin-dependent; Dr. Chalmers Blevins   Dyslipidemia    GERD (gastroesophageal reflux disease)    Hyperlipidemia    Hypertension    Hypothyroidism    Dr. Chalmers Blevins   Stroke [facility])     Current Outpatient Medications on File Prior to Visit  Medication Sig Dispense Refill   albuterol (VENTOLIN HFA) 108 (90 Base) MCG/ACT inhaler INHALE 2 PUFFS INTO THE LUNGS EVERY 6 (SIX) HOURS AS NEEDED FOR WHEEZING OR SHORTNESS OF BREATH. 3 each 0   amLODipine (NORVASC) 10 MG tablet TAKE 1 TABLET EVERY DAY 90 tablet 0   aspirin EC 81 MG tablet Take 1 tablet (81 mg total) by mouth daily. 90 tablet 3   atorvastatin (LIPITOR) 80 MG tablet Take 1 tablet (80 mg total) by mouth daily. 90 tablet 3   BD PEN NEEDLE NANO 2ND GEN 32G X 4  MM MISC 2 (two) times daily. as directed     cholecalciferol (VITAMIN D3) 25 MCG (1000 UNIT) tablet Take 1 tablet (1,000 Units total) by mouth daily. 90 tablet 3   ezetimibe (ZETIA) 10 MG tablet TAKE 1 TABLET BY MOUTH  DAILY 90 tablet 3   hydrochlorothiazide (HYDRODIURIL) 25 MG tablet TAKE 1 TABLET BY MOUTH  DAILY 90 tablet 3   latanoprost (XALATAN) 0.005 % ophthalmic solution Place 1 drop into both eyes at bedtime.  5   levothyroxine (SYNTHROID, LEVOTHROID) 50 MCG tablet Take 50 mcg by mouth daily before breakfast.     metFORMIN (GLUCOPHAGE-XR) 500 MG 24 hr tablet Take 2 tablets by mouth 2 (two) times daily.     Multiple Vitamins-Minerals (EMERGEN-C IMMUNE PLUS) PACK Take 1 tablet by mouth 2 (two) times daily. 10 each 0   NOVOLOG MIX 70/30 FLEXPEN (70-30) 100 UNIT/ML FlexPen Inject 60 Units into the skin 2 (two) times daily.   3   omeprazole (PRILOSEC) 40 MG capsule Take 40 mg by mouth every morning.     ONETOUCH VERIO test strip 3 (three) times daily.     ramipril (ALTACE) 10 MG capsule TAKE 1 CAPSULE BY MOUTH  DAILY 90 capsule 0   Vitamin D, Ergocalciferol, (DRISDOL) 1.25 MG (50000 UNIT) CAPS capsule Take by mouth.     No current facility-administered medications on file prior to visit.     The following portions of the patient's history were reviewed and updated as appropriate: allergies, current medications, past family history, past medical history, past social history, past surgical history and problem list.  ROS Otherwise as in subjective above     Objective: BP 120/64 (BP Location: Right Arm, Patient Position: Sitting)   Pulse 69   Wt 198 lb 3.2 oz (89.9 kg)   LMP  (LMP Unknown)   SpO2 98%   BMI 36.25 kg/m   Wt Readings from Last 3 Encounters:  12/31/20 198 lb 3.2 oz (89.9 kg)  11/25/20 187 lb 9.6 oz (85.1 kg)  08/08/20 186 lb 9.6 oz (84.6 kg)   General appearance: alert, no distress, well developed, well nourished No lower extremity edema, but deceased 1+ pulses of  both feet Flat feet bilat  Diabetic Foot Exam - Simple   Simple Foot Form Diabetic Foot exam was performed with the following findings: Yes 12/31/2020 11:50 AM  Visual Inspection See comments: Yes Sensation Testing Intact to touch and monofilament testing bilaterally: Yes Pulse Check See comments: Yes Comments 1+ pulses in feet, flat feet bilat       Assessment: Encounter Diagnoses  Name Primary?   Abnormal ankle brachial index (ABI) Yes   Decreased pulse    Pain in joints of both feet    Hypertrophy of nail    Type 2 diabetes mellitus with other specified complication, with long-term current use of insulin (HCC)    BMI 36.0-36.9,adult      Plan: She had mildly abnormal ABI test in January 2021 but apparently that got little worse over time.  We have made a referral to vascular surgery and she will see them soon for consult  Pains in feet-screening for gout today or inflammation with additional labs below.  Given her foot pain, hypertrophy of nails and diabetes we will refer to podiatry for pain as well as diabetic nail care and foot care  Diabetes-managed by endocrinology  Obesity - counseled on eating habits, exercise, portion control   [name] was seen today for leg pain.  Diagnoses and all orders for this visit:  Abnormal ankle brachial index (ABI)  Decreased pulse  Pain in joints of both feet -     Uric acid -     Sedimentation rate -     Ambulatory referral to Podiatry  Hypertrophy of nail -     Ambulatory referral to Podiatry  Type 2 diabetes mellitus with other specified complication, with long-term current use of insulin ([place]) -     Ambulatory referral to Podiatry  BMI 36.0-36.9,adult   Follow up: with vascular surgery and podiatry

## 2021-01-01 LAB — URIC ACID: Uric Acid: 7.5 mg/dL (ref 3.1–7.9)

## 2021-01-01 LAB — SEDIMENTATION RATE: Sed Rate: 11 mm/hr (ref 0–40)

## 2021-01-14 ENCOUNTER — Other Ambulatory Visit: Payer: Self-pay

## 2021-01-14 DIAGNOSIS — I739 Peripheral vascular disease, unspecified: Secondary | ICD-10-CM

## 2021-01-15 ENCOUNTER — Encounter: Payer: Self-pay | Admitting: Podiatry

## 2021-01-15 ENCOUNTER — Ambulatory Visit: Payer: Medicare Other | Admitting: Podiatry

## 2021-01-15 ENCOUNTER — Other Ambulatory Visit: Payer: Self-pay

## 2021-01-15 DIAGNOSIS — E1165 Type 2 diabetes mellitus with hyperglycemia: Secondary | ICD-10-CM | POA: Insufficient documentation

## 2021-01-15 DIAGNOSIS — M21619 Bunion of unspecified foot: Secondary | ICD-10-CM | POA: Diagnosis not present

## 2021-01-15 DIAGNOSIS — E1142 Type 2 diabetes mellitus with diabetic polyneuropathy: Secondary | ICD-10-CM | POA: Diagnosis not present

## 2021-01-15 DIAGNOSIS — E7800 Pure hypercholesterolemia, unspecified: Secondary | ICD-10-CM | POA: Insufficient documentation

## 2021-01-15 DIAGNOSIS — Z794 Long term (current) use of insulin: Secondary | ICD-10-CM | POA: Insufficient documentation

## 2021-01-15 DIAGNOSIS — E1140 Type 2 diabetes mellitus with diabetic neuropathy, unspecified: Secondary | ICD-10-CM | POA: Insufficient documentation

## 2021-01-15 NOTE — Progress Notes (Signed)
HISTORY AND PHYSICAL     CC:  follow up Requesting Provider:  Carlena Hurl, PA-C  HPI: Shelly Blevins is a 75 y.o. (Aug 12, 1945) female who presents  with bilateral lower extremity pain.  She was seen by Dr. Donnetta Hutching in February 2021 for discomfort in the left pretibial region.  This was most noted when she was sitting for prolonged periods of time and when she first arises.  She did not have claudication symptoms or tightening if she did a great deal of walking.  No hx of tissue loss.  Her ABI at the time was 0.81 on the right and 0.86 on the left with biphasic waveforms.  She was advised to continue her walking regimen and she could follow up as needed.    She had a health screening on 11/2020, which revealed ABI of 1 on the right 0.79 on the left.  She was referred back for evaluation.  She states that her legs hurt all the time.  She has trouble walking.  She has pain in both knees and her ankles.  She does not have claudication sx, rest pain or non healing wounds.  She states that when her knees get to hurting, she rubs vicks vapor rub on them and they feel better.  She used to work at [employer] for 43 years and retired last year.  She states that she is now limited by getting short of breath.  She used to be able to sing in choir and is unable to do this anymore.  She is going to see Dr. Debara Pickett at the beginning of the year.  She does have DM and her last A1c was 7.  She wants to start going to the [facility].    The pt is on a statin for cholesterol management.  The pt is on a daily aspirin.   Other AC:  none The pt is on CCB, ACEI for hypertension.   The pt is diabetic.   Tobacco hx:  former-quit in 1988  Pt does not have family hx of AAA.  Past Medical History:  Diagnosis Date   Allergy    Arthritis    CAD (coronary artery disease)    mild nonobstructive disease, 30% Cfx lesion   Cataract    Diabetes mellitus    insulin-dependent; Dr. Chalmers Cater   Dyslipidemia    GERD (gastroesophageal  reflux disease)    Hyperlipidemia    Hypertension    Hypothyroidism    Dr. Chalmers Cater   Stroke [facility])     Past Surgical History:  Procedure Laterality Date   ABDOMINAL HYSTERECTOMY     CARDIAC CATHETERIZATION  02/17/2007   mild CAD with 50% prox L Cfx stenosis, normal LV function (Dr. Norlene Duel)   Hall WALL MOTION  2009   lexiscan myoview - perfusion defect in anterior myociaral region (breast attenuation) - low risk scan - EF 69%   THYROIDECTOMY     TRANSTHORACIC ECHOCARDIOGRAM  2008   normal study     Social History   Socioeconomic History   Marital status: Married    Spouse name: Not on file   Number of children: 2   Years of education: 10   Highest education level: Not on file  Occupational History    Employer: [employer]  Tobacco Use   Smoking status: Former    Types: Cigarettes    Quit date: 04/02/1986  Years since quitting: 34.8   Smokeless tobacco: Never  Vaping Use   Vaping Use: Never used  Substance and Sexual Activity   Alcohol use: No   Drug use: No   Sexual activity: Not on file  Other Topics Concern   Not on file  Social History Narrative   Not on file   Social Determinants of Health   Financial Resource Strain: Low Risk    Difficulty of Paying Living Expenses: Not very hard  Food Insecurity: No Food Insecurity   Worried About Running Out of Food in the Last Year: Never true   Ran Out of Food in the Last Year: Never true  Transportation Needs: No Transportation Needs   Lack of Transportation (Medical): No   Lack of Transportation (Non-Medical): No  Physical Activity: Not on file  Stress: Not on file  Social Connections: Not on file  Intimate Partner Violence: Not on file     Family History  Problem Relation Age of Onset   Heart failure Mother    Diabetes Mother    Sudden death Sister        died at 24   Heart failure Brother        died at 43   Colon cancer Neg Hx    Esophageal  cancer Neg Hx    Stomach cancer Neg Hx    Rectal cancer Neg Hx     Current Outpatient Medications  Medication Sig Dispense Refill   albuterol (VENTOLIN HFA) 108 (90 Base) MCG/ACT inhaler INHALE 2 PUFFS INTO THE LUNGS EVERY 6 (SIX) HOURS AS NEEDED FOR WHEEZING OR SHORTNESS OF BREATH. 3 each 0   amLODipine (NORVASC) 10 MG tablet TAKE 1 TABLET EVERY DAY 90 tablet 0   aspirin EC 81 MG tablet Take 1 tablet (81 mg total) by mouth daily. 90 tablet 3   atorvastatin (LIPITOR) 80 MG tablet Take 1 tablet (80 mg total) by mouth daily. 90 tablet 3   BD PEN NEEDLE NANO 2ND GEN 32G X 4 MM MISC 2 (two) times daily. as directed     cholecalciferol (VITAMIN D3) 25 MCG (1000 UNIT) tablet Take 1 tablet (1,000 Units total) by mouth daily. 90 tablet 3   ezetimibe (ZETIA) 10 MG tablet TAKE 1 TABLET BY MOUTH  DAILY 90 tablet 3   hydrochlorothiazide (HYDRODIURIL) 25 MG tablet TAKE 1 TABLET BY MOUTH  DAILY 90 tablet 3   latanoprost (XALATAN) 0.005 % ophthalmic solution Place 1 drop into both eyes at bedtime.  5   levothyroxine (SYNTHROID, LEVOTHROID) 50 MCG tablet Take 50 mcg by mouth daily before breakfast.     metFORMIN (GLUCOPHAGE-XR) 500 MG 24 hr tablet Take 2 tablets by mouth 2 (two) times daily.     Multiple Vitamins-Minerals (EMERGEN-C IMMUNE PLUS) PACK Take 1 tablet by mouth 2 (two) times daily. 10 each 0   NOVOLOG MIX 70/30 FLEXPEN (70-30) 100 UNIT/ML FlexPen Inject 60 Units into the skin 2 (two) times daily.   3   omeprazole (PRILOSEC) 40 MG capsule Take 40 mg by mouth every morning.     ONETOUCH VERIO test strip 3 (three) times daily.     ramipril (ALTACE) 10 MG capsule TAKE 1 CAPSULE BY MOUTH  DAILY 90 capsule 0   Vitamin D, Ergocalciferol, (DRISDOL) 1.25 MG (50000 UNIT) CAPS capsule Take by mouth.     No current facility-administered medications for this visit.    No Known Allergies   REVIEW OF SYSTEMS:   [X]  denotes positive finding, [ ]   denotes negative finding Cardiac  Comments:  Chest pain  or chest pressure:    Shortness of breath upon exertion:    Short of breath when lying flat:    Irregular heart rhythm:        Vascular    Pain in calf, thigh, or hip brought on by ambulation:    Pain in feet at night that wakes you up from your sleep:     Blood clot in your veins:    Leg swelling:         Pulmonary    Oxygen at home:    Productive cough:     Wheezing:         Neurologic    Sudden weakness in arms or legs:     Sudden numbness in arms or legs:     Sudden onset of difficulty speaking or slurred speech:    Temporary loss of vision in one eye:     Problems with dizziness:         Gastrointestinal    Blood in stool:     Vomited blood:         Genitourinary    Burning when urinating:     Blood in urine:        Psychiatric    Major depression:         Hematologic    Bleeding problems:    Problems with blood clotting too easily:        Skin    Rashes or ulcers:        Constitutional    Fever or chills:      PHYSICAL EXAMINATION:  Today's Vitals   01/20/21 1458  BP: (!) 158/67  Pulse: 65  Resp: 14  Temp: 97.6 F (36.4 C)  TempSrc: Temporal  SpO2: 100%  Weight: 185 lb (83.9 kg)  Height: 5\' 2"  (1.575 m)  PainSc: 7    Body mass index is 33.84 kg/m.   General:  WDWN in NAD; vital signs documented above Gait: Not observed HENT: WNL, normocephalic Pulmonary: normal non-labored breathing Cardiac: regular HR, without carotid bruits Abdomen: soft, NT, no masses; aortic pulse is not palpable Skin: without rashes Vascular Exam/Pulses:  Right Left  Radial 2+ (normal) 2+ (normal)  Popliteal Unable to palpate Unable to palpate  DP 2+ (normal) 2+ (normal)  PT 2+ (normal) 2+ (normal)   Extremities: without ischemic changes, without Gangrene , without cellulitis; without open wounds;  Musculoskeletal: no muscle wasting or atrophy  Neurologic: A&O X 3;  No focal weakness or paresthesias are detected; speech fluent/normal Psychiatric:  The pt has  Normal affect.   Non-Invasive Vascular Imaging on 01/20/2021:   +-------+-----------+-----------+------------+------------+  ABI/TBIToday's ABIToday's TBIPrevious ABIPrevious TBI  +-------+-----------+-----------+------------+------------+  Right  0.88       0.58       0.81                      +-------+-----------+-----------+------------+------------+  Left   0.92       0.71       0.86                      +-------+-----------+-----------+------------+------------+     ASSESSMENT/PLAN:: 74 y.o. female here for follow up for PAD   -pt's ABI are actually improved from her last visit and she has palpable pedal pulses.  Do not feel that her pain is due to arterial disease.  Given her DM, discussed with her to  try a graduated walking program [facility] off of her joints.  Discussed weight loss and discussing different programs with her PCP that may be right for her to lose weight.   -continue asa/stain and graduated walking program. -discussed with her to check her feet daily and if she develops any wounds that are slow to heal or rest pain, to call us to be seen.   Leontine Locket, [facility] [phone]  Clinic MD:   Trula Slade

## 2021-01-15 NOTE — Progress Notes (Signed)
  Subjective:  Patient ID: Shelly Blevins, female    DOB: 1946/03/03,   MRN: [id number]  Chief Complaint  Patient presents with   Nail Problem    Look at the toenails and have them trimmed and I am a diabetic     75 y.o. female presents for diabetic foot exam and thickened discolored toenail. Realates occasional pain and swelling in both ankle. Will be following with a vein specialist in the coming weeks. Relates her last A1c was 7.  . Denies any other pedal complaints. Denies n/v/f/c.   PCP: Chana Bode. PA-C  Past Medical History:  Diagnosis Date   Allergy    Arthritis    CAD (coronary artery disease)    mild nonobstructive disease, 30% Cfx lesion   Cataract    Diabetes mellitus    insulin-dependent; Dr. Chalmers Cater   Dyslipidemia    GERD (gastroesophageal reflux disease)    Hyperlipidemia    Hypertension    Hypothyroidism    Dr. Chalmers Cater   Stroke [facility])     Objective:  Physical Exam: Vascular: DP/PT pulses 2/4 bilateral. CFT <3 seconds. Normal hair growth on digits. Mild edema noted to bilateral ankles.  Skin. No lacerations or abrasions bilateral feet. Thickened elongated toenails 1-5 B/l.  Musculoskeletal: MMT 5/5 bilateral lower extremities in DF, PF, Inversion and Eversion. Deceased ROM in DF of ankle joint.  Right foot HAV deformity.  Neurological: Sensation intact to light touch.   Assessment:   1. Diabetic polyneuropathy associated with type 2 diabetes mellitus ([place])   2. Bunion      Plan:  Patient was evaluated and treated and all questions answered. -Discussed and educated patient on diabetic foot care, especially with  regards to the vascular, neurological and musculoskeletal systems.  -Stressed the importance of good glycemic control and the detriment of not  controlling glucose levels in relation to the foot. -Discussed supportive shoes at all times and checking feet regularly.  -Ankle sleeves dispensed for ankle edema.  -Mechanically debrided all nails  1-5 bilateral using sterile nail nipper and filed with dremel without incident as courtesy.  -Answered all patient questions -Patient to return  in 1 year for diabetic foot exam.  -Patient advised to call the office if any problems or questions arise in the meantime.   Lorenda Peck, DPM

## 2021-01-20 ENCOUNTER — Other Ambulatory Visit: Payer: Self-pay

## 2021-01-20 ENCOUNTER — Encounter: Payer: Self-pay | Admitting: Physician Assistant

## 2021-01-20 ENCOUNTER — Ambulatory Visit: Payer: Medicare Other | Admitting: Physician Assistant

## 2021-01-20 ENCOUNTER — Ambulatory Visit (HOSPITAL_COMMUNITY)
Admission: RE | Admit: 2021-01-20 | Discharge: 2021-01-20 | Disposition: A | Discharge status: Home / Self Care | Payer: Medicare Other | Source: Ambulatory Visit | Attending: Surgery | Admitting: Surgery

## 2021-01-20 VITALS — BP 158/67 | HR 65 | Temp 97.6°F | Resp 14 | Ht 62.0 in | Wt 185.0 lb

## 2021-01-20 DIAGNOSIS — I739 Peripheral vascular disease, unspecified: Secondary | ICD-10-CM | POA: Insufficient documentation

## 2021-01-23 ENCOUNTER — Telehealth: Payer: Self-pay | Admitting: *Deleted

## 2021-01-23 DIAGNOSIS — D649 Anemia, unspecified: Secondary | ICD-10-CM | POA: Insufficient documentation

## 2021-01-23 NOTE — Telephone Encounter (Signed)
I called patient about clarification of a question on Coordinate Diabetes Study. I need to know if patient agrees to let [facility] follow her for up to 5 years. I left message for patient to call me back.

## 2021-02-02 IMAGING — CR DG KNEE COMPLETE 4+V*L*
4 series · 4 of 4 positions shown · non-contrast
Comparison: None.

CLINICAL DATA: Left knee pain

EXAM:
LEFT KNEE - COMPLETE 4+ VIEW

[w knee ap left]
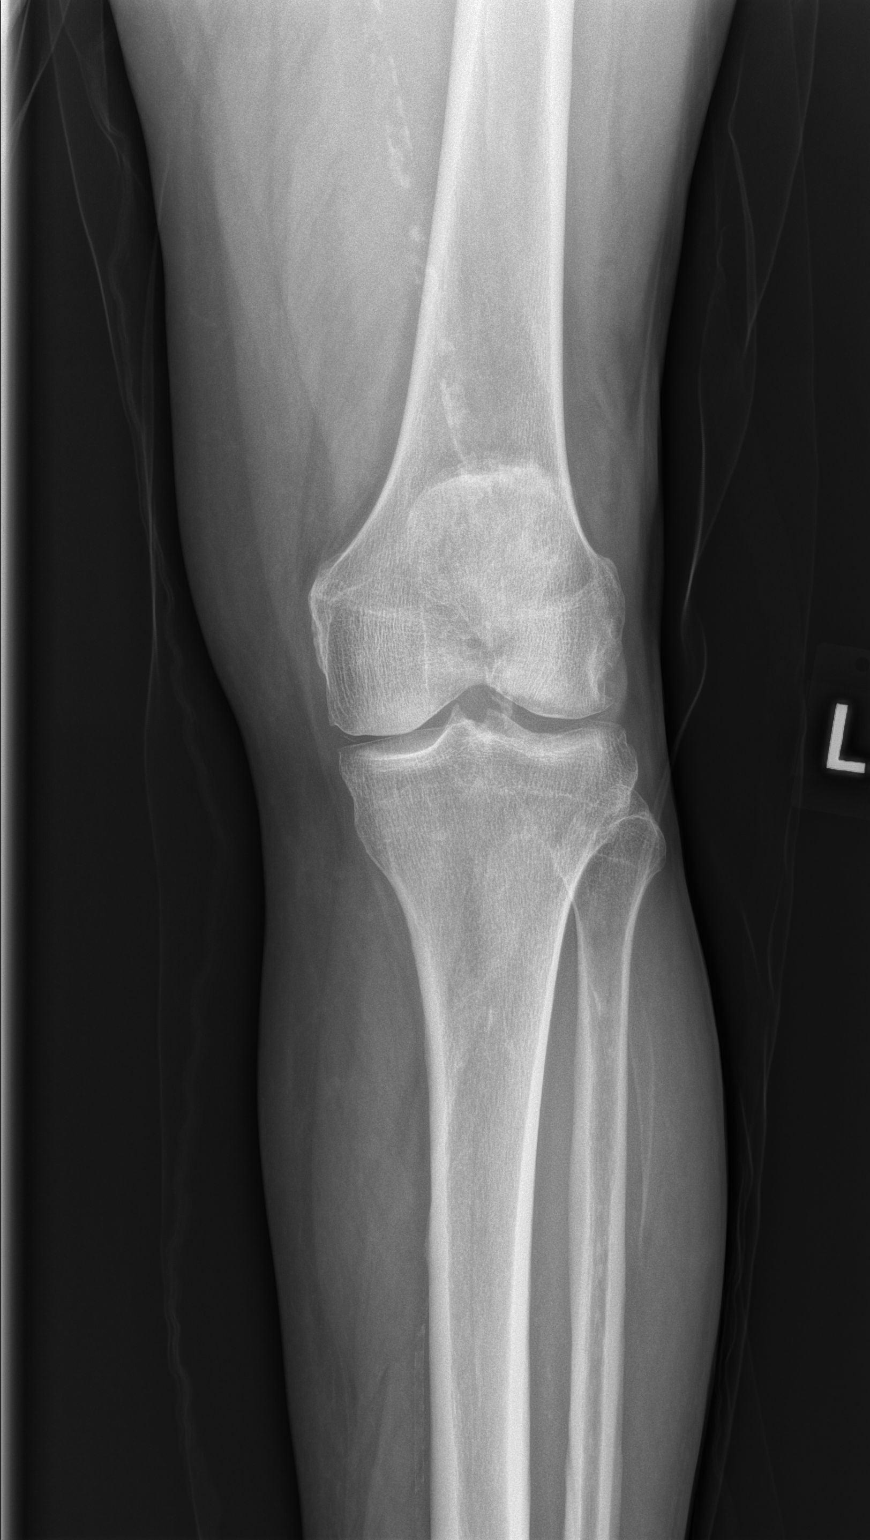

[w knee lat left]
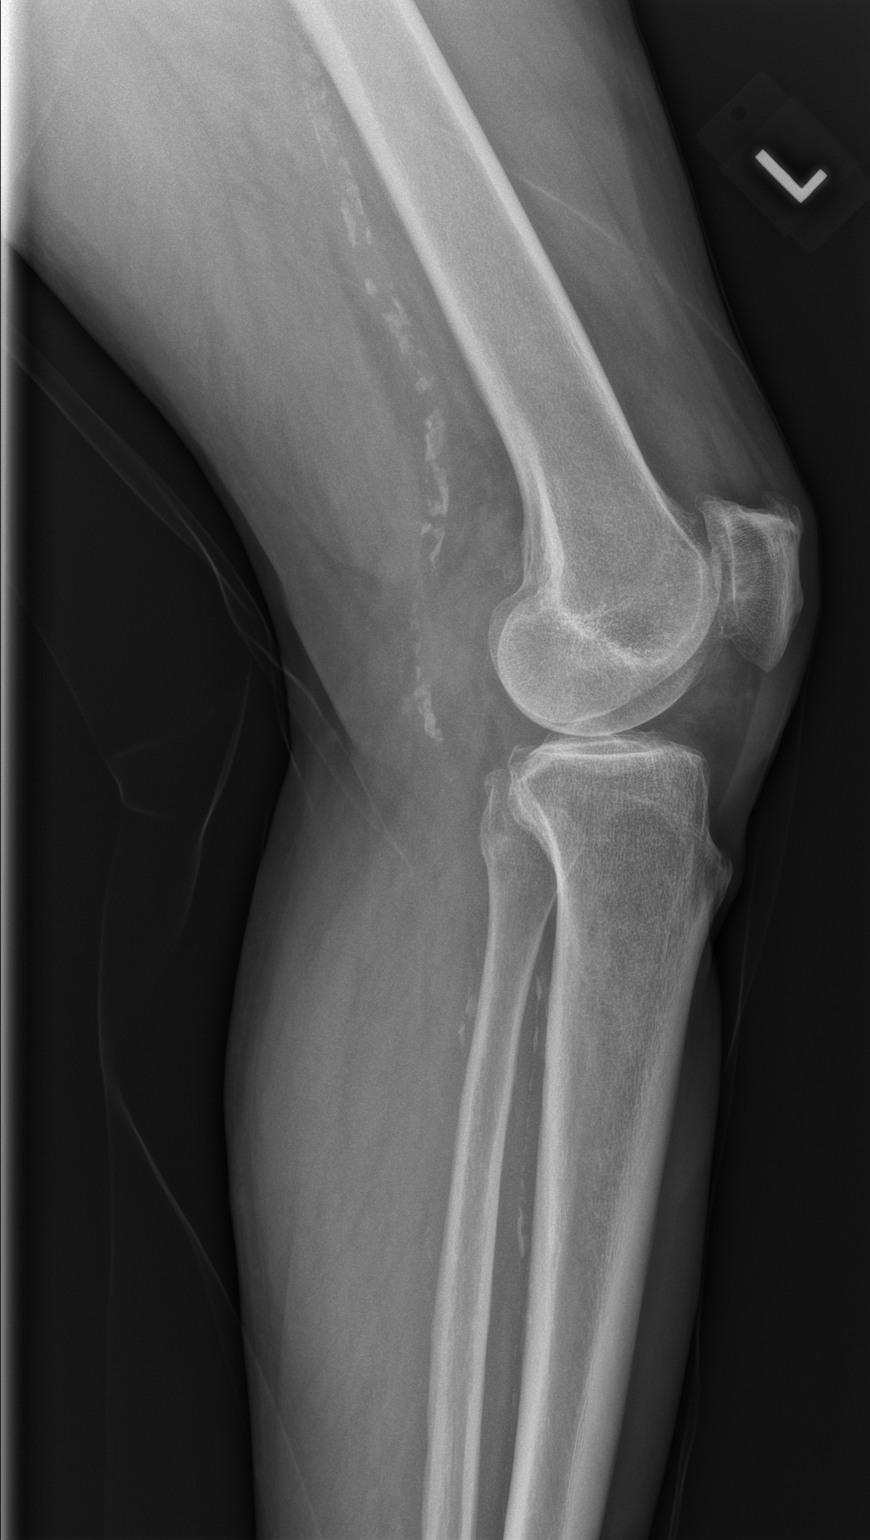

[w knee tunnel pa left]
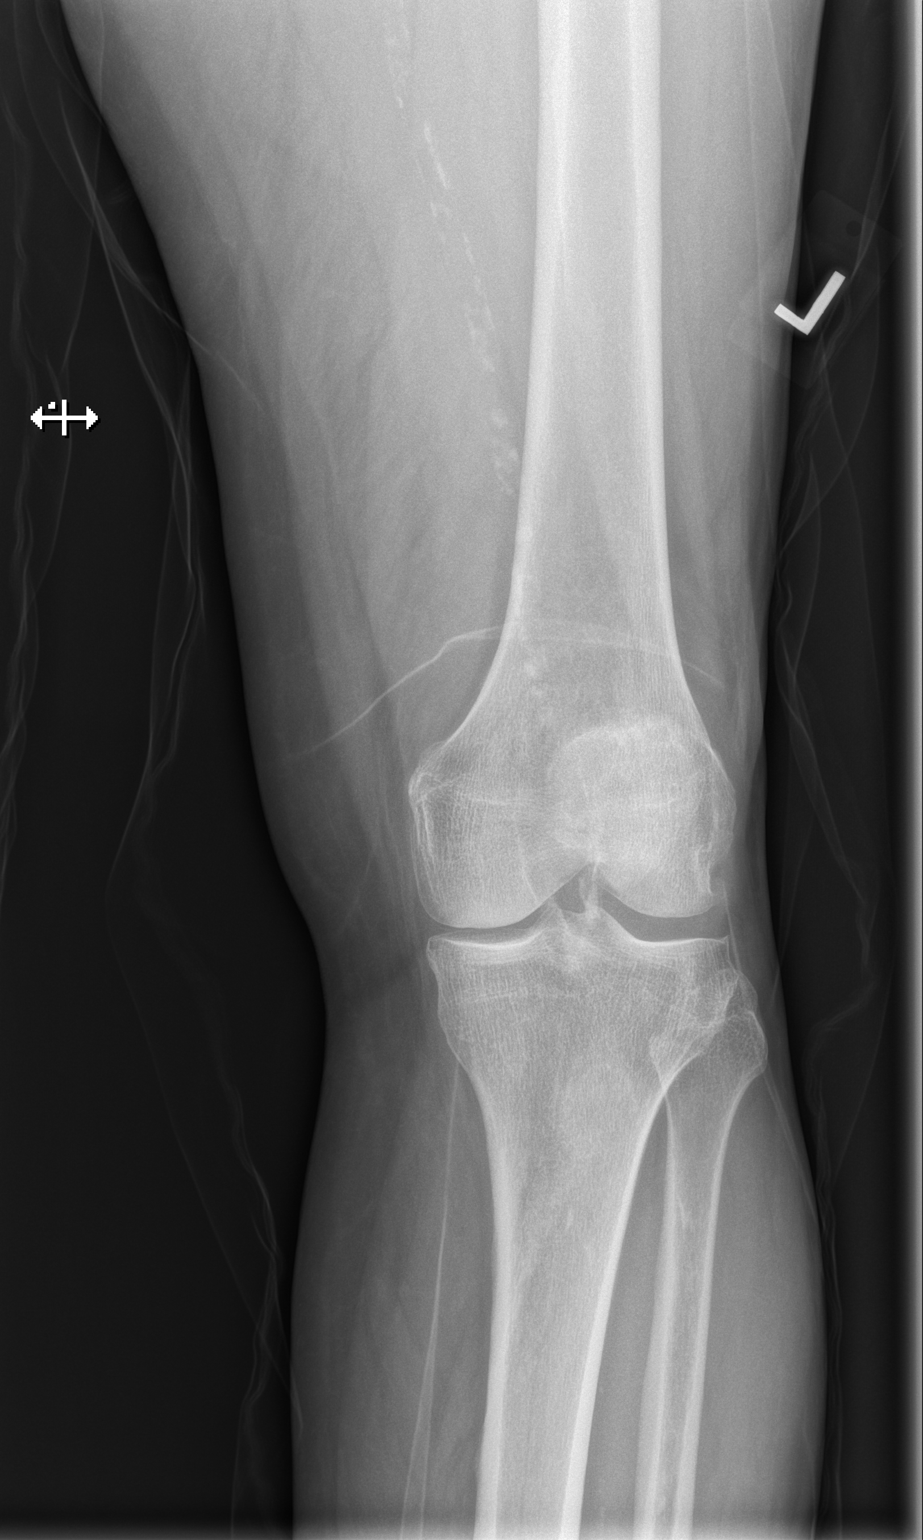

[x knee sunrise left]
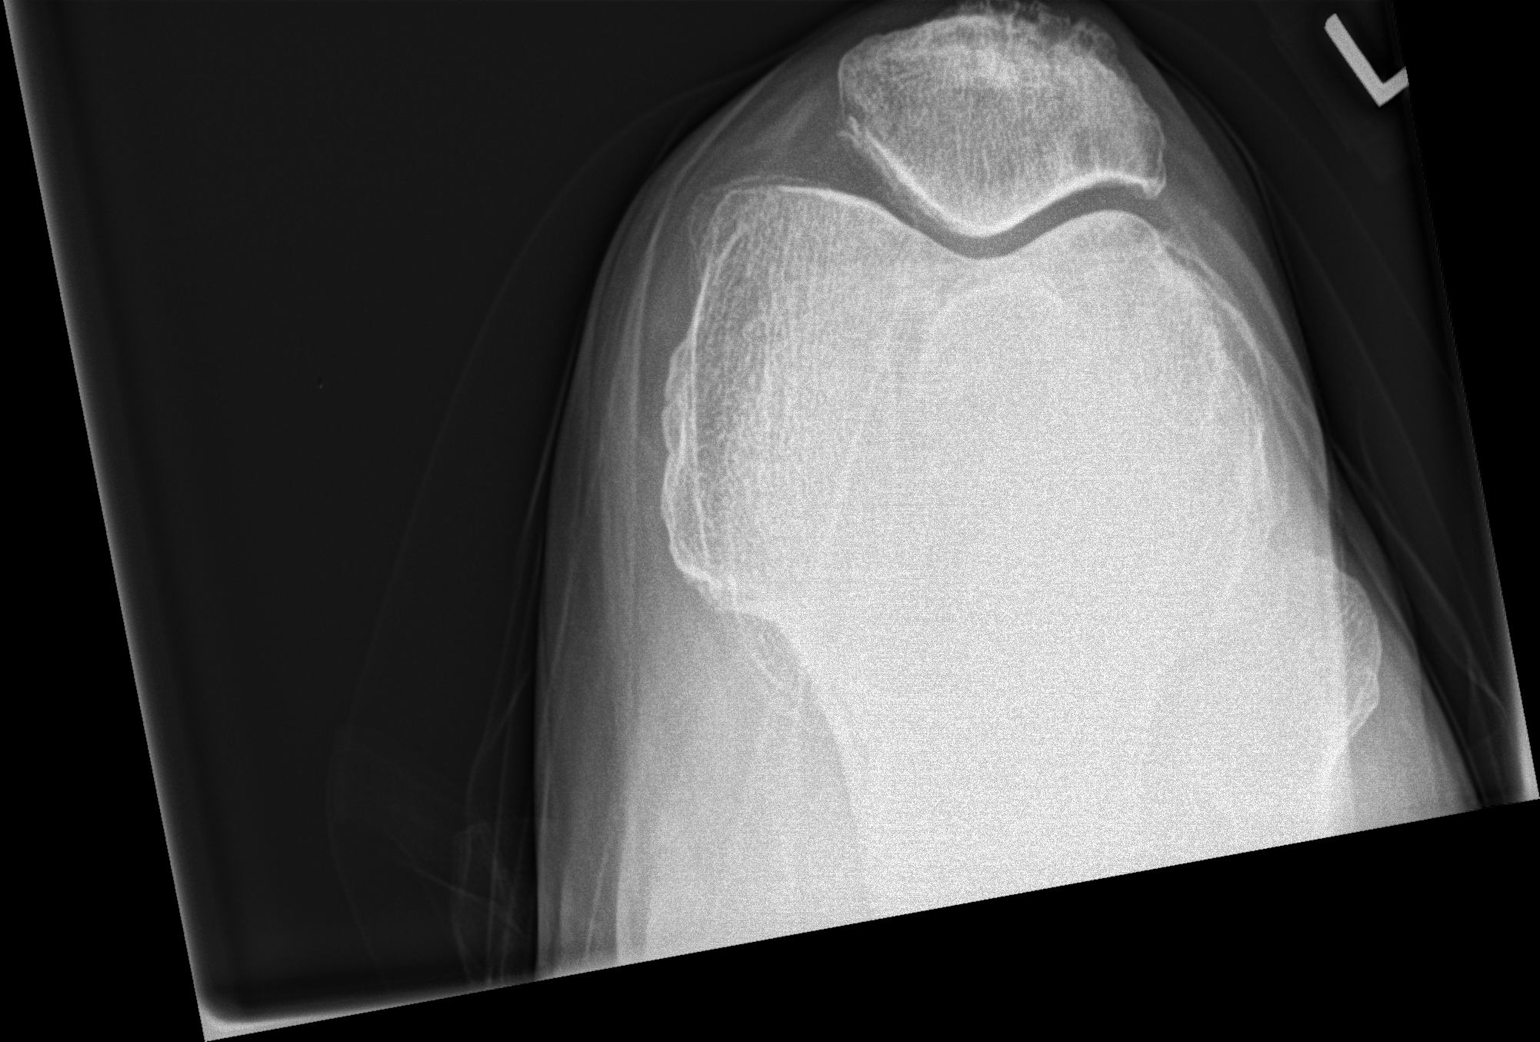

[4 of 4 positions shown; findings below may reference images not displayed]

FINDINGS: Alignment is anatomic. There is no joint effusion. No intrinsic
osseous lesion. There are mild changes of osteoarthritis the medial
and patellofemoral compartments. Extensive vascular calcification
noted.
IMPRESSION: Mild osteoarthritis.

## 2021-02-02 IMAGING — CR DG KNEE COMPLETE 4+V*R*
4 series · 4 of 4 positions shown · non-contrast
Comparison: None.

CLINICAL DATA: Right knee pain.  No known injury.

EXAM:
RIGHT KNEE - COMPLETE 4+ VIEW

[w knee ap right]
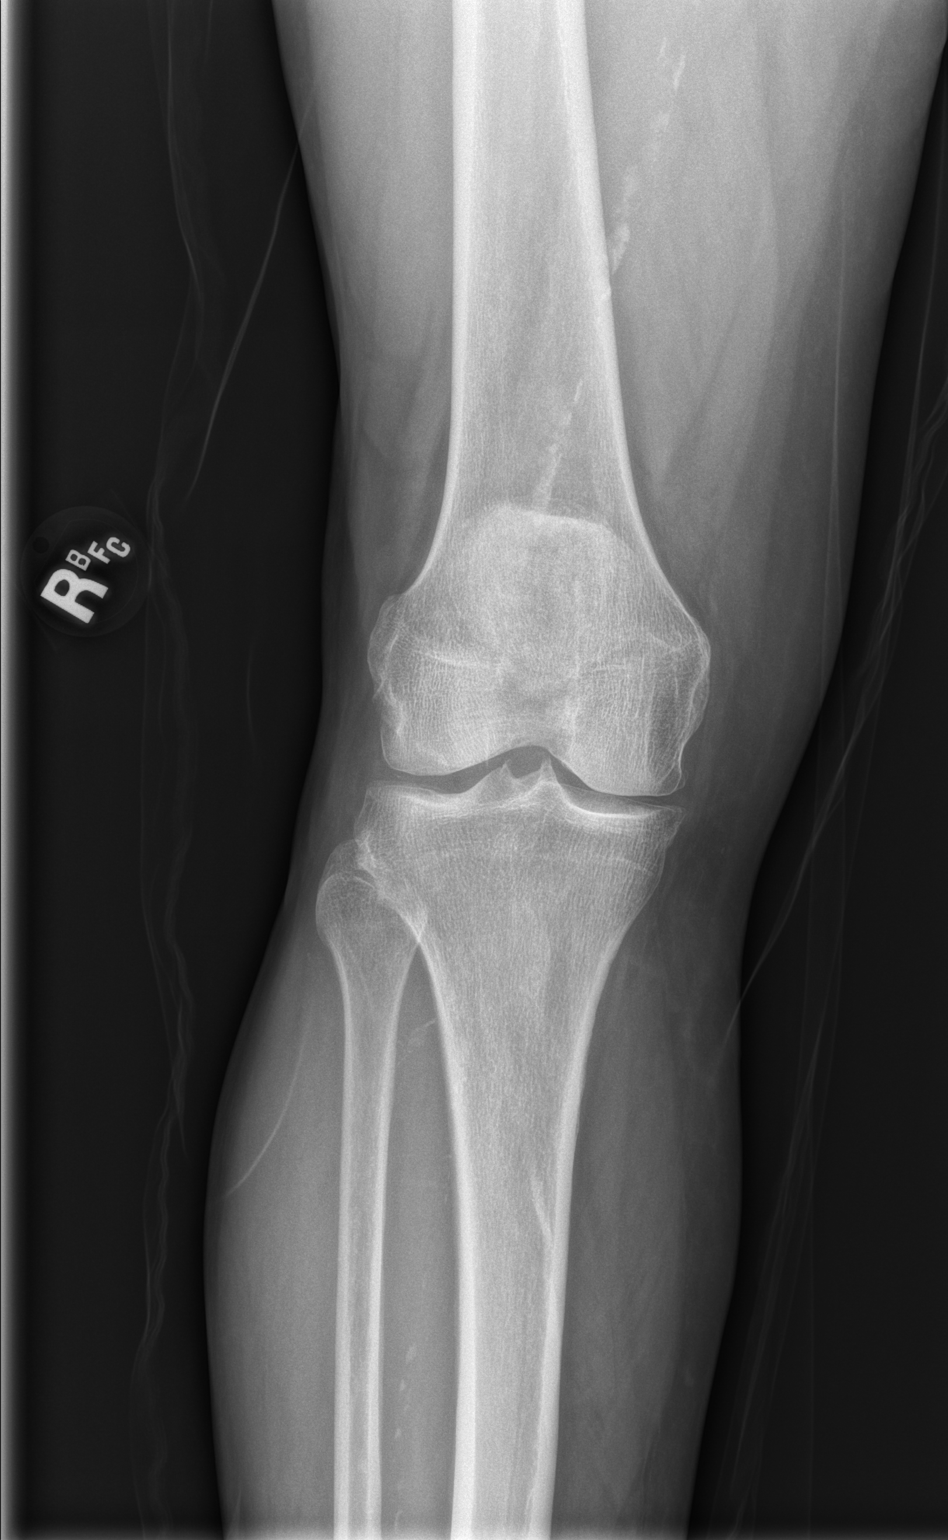

[w knee lat right]
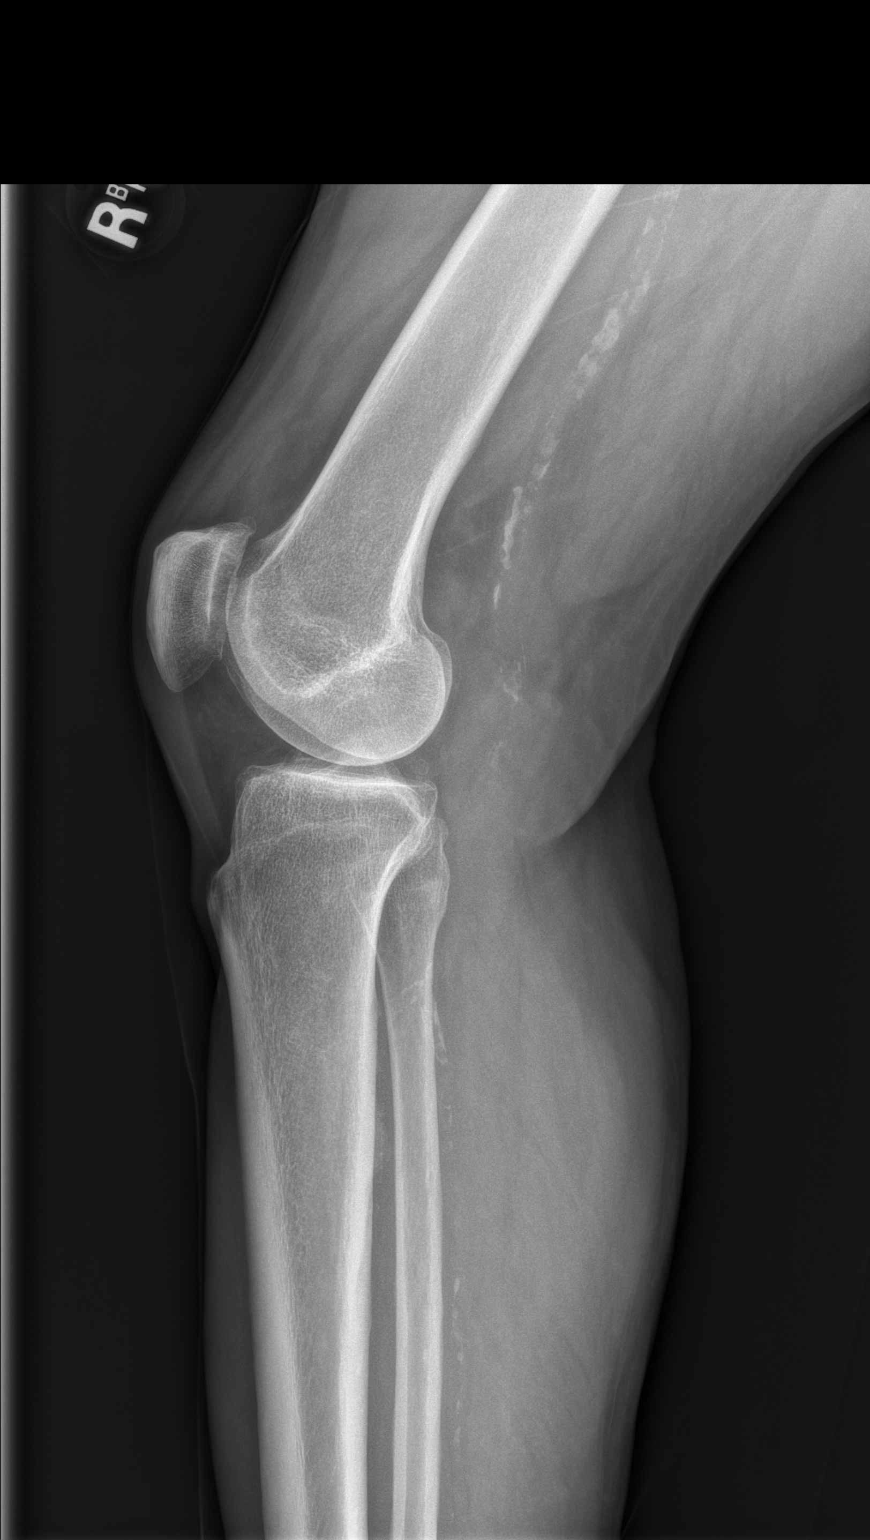

[w knee tunnel pa right]
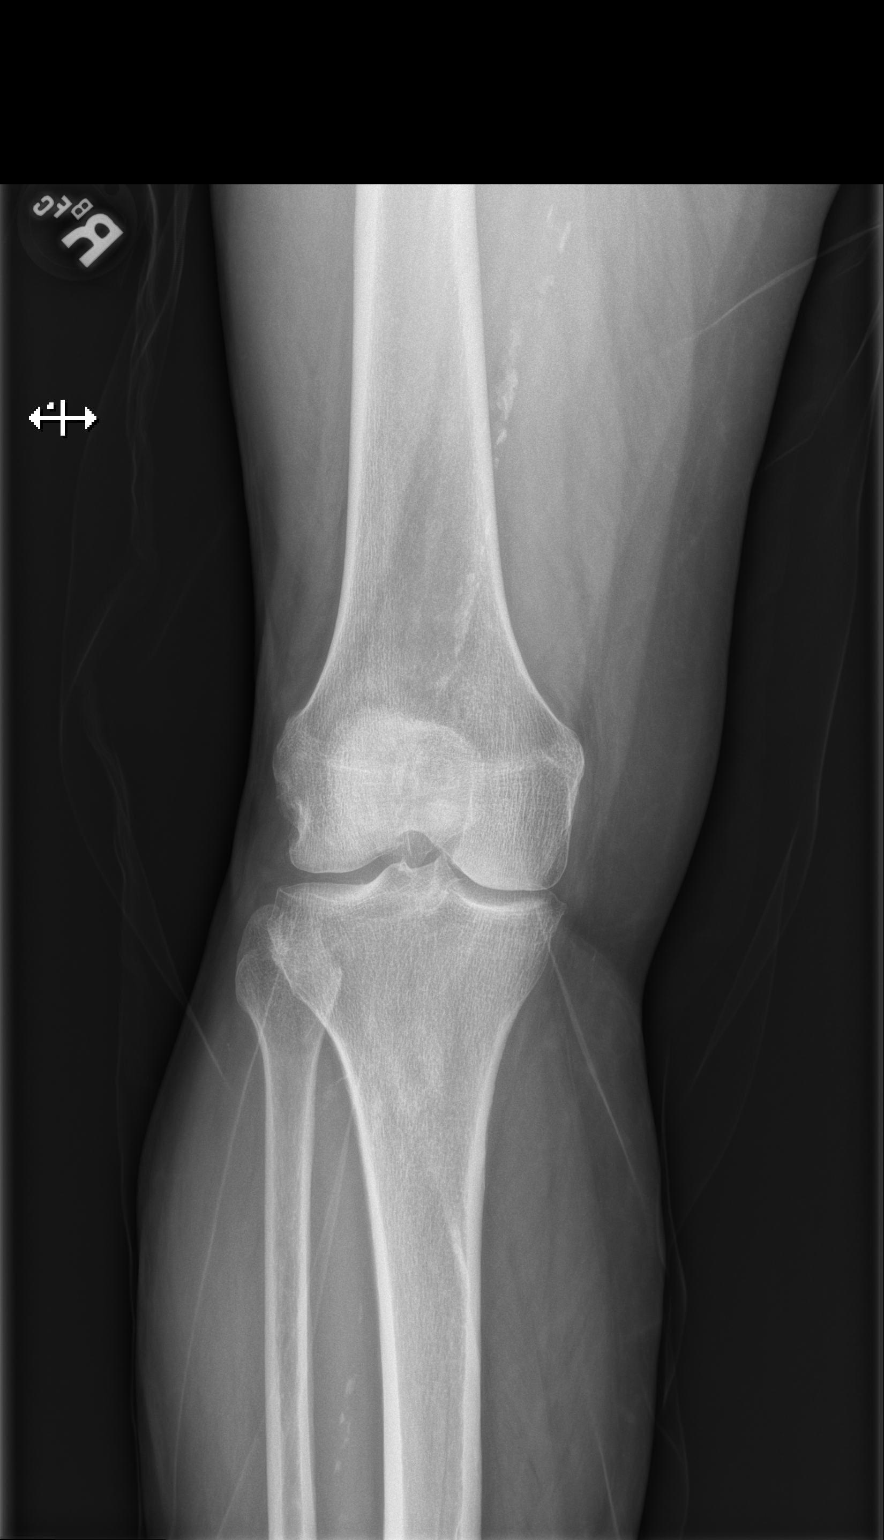

[x knee sunrise right]
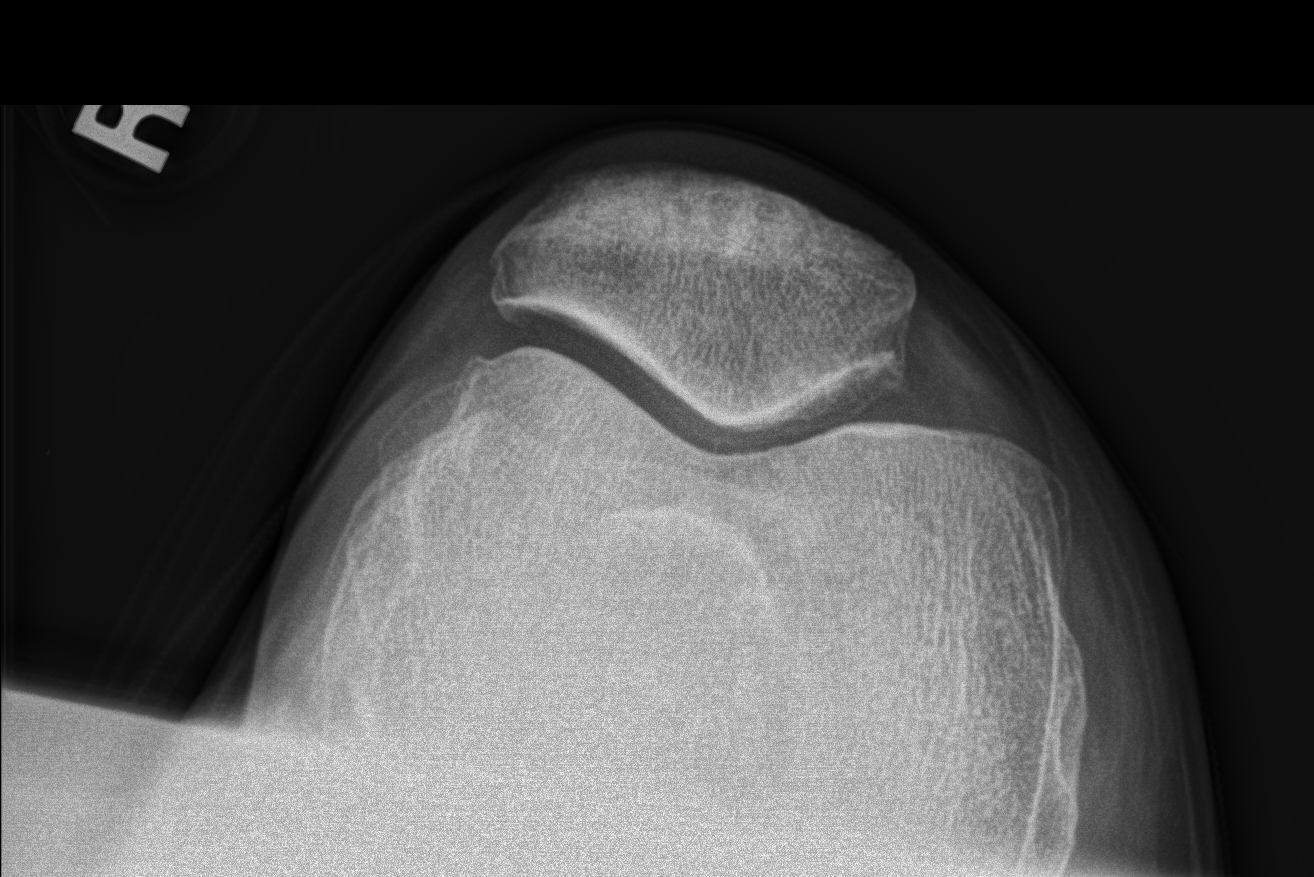

[4 of 4 positions shown; findings below may reference images not displayed]

FINDINGS: Early joint space narrowing in the patellofemoral compartment with
early spurring in the patellofemoral compartment and tibial spines.
No acute bony abnormality. Specifically, no fracture, subluxation,
or dislocation. No joint effusion. Vascular calcification.
IMPRESSION: Early osteoarthritis.  No acute bony abnormality.

## 2021-02-10 ENCOUNTER — Ambulatory Visit (INDEPENDENT_AMBULATORY_CARE_PROVIDER_SITE_OTHER): Payer: Medicare Other | Admitting: Medical

## 2021-02-10 ENCOUNTER — Other Ambulatory Visit: Payer: Self-pay

## 2021-02-10 VITALS — Wt 185.0 lb

## 2021-02-10 DIAGNOSIS — J988 Other specified respiratory disorders: Secondary | ICD-10-CM | POA: Diagnosis not present

## 2021-02-10 DIAGNOSIS — R051 Acute cough: Secondary | ICD-10-CM

## 2021-02-10 DIAGNOSIS — Z20828 Contact with and (suspected) exposure to other viral communicable diseases: Secondary | ICD-10-CM

## 2021-02-10 MED ORDER — AZITHROMYCIN 250 MG PO TABS: ORAL_TABLET | ORAL | 0 refills | Status: DC

## 2021-02-10 MED ORDER — BENZONATATE 100 MG PO CAPS: 100.0000 mg | ORAL_CAPSULE | Freq: Two times a day (BID) | ORAL | 0 refills | Status: DC | PRN

## 2021-02-10 NOTE — Progress Notes (Signed)
Subjective:     Patient ID: Shelly Blevins, female   DOB: [birth year], 75 y.o.   MRN: [id number]  This visit type was conducted due to national recommendations for restrictions regarding the COVID-19 Pandemic (e.g. social distancing) in an effort to limit this patient's exposure and mitigate transmission in our community.  Due to their co-morbid illnesses, this patient is at least at moderate risk for complications without adequate follow up.  This format is felt to be most appropriate for this patient at this time.    Documentation for virtual audio and video telecommunications through [facility] encounter:  The patient was located at home. The provider was located in the office. The patient did consent to this visit and is aware of possible charges through their insurance for this visit.  The other persons participating in this telemedicine service were none. Time spent on call was 20 minutes and in review of previous records 20 minutes total.  This virtual service is not related to other E/M service within previous 7 days.   HPI Chief Complaint  Patient presents with   cough    Cough, sore throat, night time is the worse,  symptoms started 4-5 days.    Virtual consult for illness.  She notes about 5-day history of respiratory symptoms including cough, clear mucus, some sore throat resolving in the morning, drainage, chills at times, ears are achy.  Otherwise no fever, no nausea vomiting or diarrhea, no belly or back pain, she notes some mild shortness of breath.  Her to [place] are sick with  RSV in the same household.  Using Robitussin.  She just finished prednison just a few days ago.   was on a 6-day course prior for gout.  No other aggravating or relieving factors. No other complaint.   Past Medical History:  Diagnosis Date   Allergy    Arthritis    CAD (coronary artery disease)    mild nonobstructive disease, 30% Cfx lesion   Cataract    Diabetes mellitus     insulin-dependent; Dr. Chalmers Cater   Dyslipidemia    GERD (gastroesophageal reflux disease)    Hyperlipidemia    Hypertension    Hypothyroidism    Dr. Chalmers Cater   Stroke [facility])    Current Outpatient Medications on File Prior to Visit  Medication Sig Dispense Refill   albuterol (VENTOLIN HFA) 108 (90 Base) MCG/ACT inhaler INHALE 2 PUFFS INTO THE LUNGS EVERY 6 (SIX) HOURS AS NEEDED FOR WHEEZING OR SHORTNESS OF BREATH. 3 each 0   amLODipine (NORVASC) 10 MG tablet TAKE 1 TABLET EVERY DAY 90 tablet 0   aspirin EC 81 MG tablet Take 1 tablet (81 mg total) by mouth daily. 90 tablet 3   atorvastatin (LIPITOR) 80 MG tablet Take 1 tablet (80 mg total) by mouth daily. 90 tablet 3   BD PEN NEEDLE NANO 2ND GEN 32G X 4 MM MISC 2 (two) times daily. as directed     cholecalciferol (VITAMIN D3) 25 MCG (1000 UNIT) tablet Take 1 tablet (1,000 Units total) by mouth daily. 90 tablet 3   ezetimibe (ZETIA) 10 MG tablet TAKE 1 TABLET BY MOUTH  DAILY 90 tablet 3   hydrochlorothiazide (HYDRODIURIL) 25 MG tablet TAKE 1 TABLET BY MOUTH  DAILY 90 tablet 3   latanoprost (XALATAN) 0.005 % ophthalmic solution Place 1 drop into both eyes at bedtime.  5   levothyroxine (SYNTHROID, LEVOTHROID) 50 MCG tablet Take 50 mcg by mouth daily before breakfast.     metFORMIN (GLUCOPHAGE-XR)  500 MG 24 hr tablet Take 2 tablets by mouth 2 (two) times daily.     Multiple Vitamins-Minerals (EMERGEN-C IMMUNE PLUS) PACK Take 1 tablet by mouth 2 (two) times daily. 10 each 0   NOVOLOG MIX 70/30 FLEXPEN (70-30) 100 UNIT/ML FlexPen Inject 60 Units into the skin 2 (two) times daily.   3   omeprazole (PRILOSEC) 40 MG capsule Take 40 mg by mouth every morning.     ONETOUCH VERIO test strip 3 (three) times daily.     ramipril (ALTACE) 10 MG capsule TAKE 1 CAPSULE BY MOUTH  DAILY 90 capsule 0   Vitamin D, Ergocalciferol, (DRISDOL) 1.25 MG (50000 UNIT) CAPS capsule Take by mouth.     No current facility-administered medications on file prior to visit.    Review of Systems As in subjective    Objective:   Physical Exam Due to coronavirus pandemic stay at home measures, patient visit was virtual and they were not examined in person.   Gen: wd, wn ,nad, mildly ill-appearing No labored breathing or wheezing audible      Assessment:     Encounter Diagnoses  Name Primary?   Acute cough Yes   RSV exposure    Respiratory tract infection        Plan:     Advised rest, hydration, can continue some over-the-counter Robitussin or Mucinex She will come and tomorrow for testing given the RSV exposure.  She just finished a course of prednisone for gout from another doctor.  Advise albuterol which she already has at home as needed every 4-6 hours.  Begin Tessalon Perles for cough as needed.  If not seeing improvements over the next few days or worsening rattly chest or wet cough then begin Z-Pak as well.    Iveth was seen today for cough.  Diagnoses and all orders for this visit:  Acute cough  RSV exposure  Respiratory tract infection  Other orders -     azithromycin (ZITHROMAX) 250 MG tablet; 2 tablets day 1, then 1 tablet days 2-4 -     benzonatate (TESSALON) 100 MG capsule; Take 1 capsule (100 mg total) by mouth 2 (two) times daily as needed for cough.  F/u in our back parking lot for combo rsv, flu, covid test

## 2021-02-11 ENCOUNTER — Other Ambulatory Visit (INDEPENDENT_AMBULATORY_CARE_PROVIDER_SITE_OTHER): Payer: Medicare Other

## 2021-02-11 ENCOUNTER — Other Ambulatory Visit: Payer: Self-pay

## 2021-02-11 DIAGNOSIS — R059 Cough, unspecified: Secondary | ICD-10-CM

## 2021-02-11 LAB — POCT RESPIRATORY SYNCYTIAL VIRUS: RSV Rapid Ag: NEGATIVE

## 2021-02-11 LAB — POC COVID19 BINAXNOW: SARS Coronavirus 2 Ag: NEGATIVE

## 2021-02-11 LAB — POCT INFLUENZA A/B
Influenza A, POC: NEGATIVE
Influenza B, POC: NEGATIVE

## 2021-02-11 LAB — HM DIABETES EYE EXAM

## 2021-02-12 ENCOUNTER — Encounter: Payer: Self-pay | Admitting: Internal Medicine

## 2021-02-12 ENCOUNTER — Encounter: Payer: Self-pay | Admitting: Medical

## 2021-02-12 LAB — SARS-COV-2, NAA 2 DAY TAT

## 2021-02-12 LAB — NOVEL CORONAVIRUS, NAA: SARS-CoV-2, NAA: NOT DETECTED

## 2021-03-17 ENCOUNTER — Other Ambulatory Visit: Payer: Self-pay | Admitting: Medical

## 2021-04-03 ENCOUNTER — Ambulatory Visit: Payer: Medicare Other | Admitting: Internal Medicine

## 2021-04-03 ENCOUNTER — Other Ambulatory Visit: Payer: Self-pay

## 2021-04-03 VITALS — BP 126/60 | HR 56 | Ht 62.0 in | Wt 184.0 lb

## 2021-04-03 DIAGNOSIS — I2583 Coronary atherosclerosis due to lipid rich plaque: Secondary | ICD-10-CM | POA: Diagnosis not present

## 2021-04-03 DIAGNOSIS — I2510 Atherosclerotic heart disease of native coronary artery without angina pectoris: Secondary | ICD-10-CM | POA: Diagnosis not present

## 2021-04-03 DIAGNOSIS — I10 Essential (primary) hypertension: Secondary | ICD-10-CM | POA: Diagnosis not present

## 2021-04-03 DIAGNOSIS — Z794 Long term (current) use of insulin: Secondary | ICD-10-CM

## 2021-04-03 DIAGNOSIS — E785 Hyperlipidemia, unspecified: Secondary | ICD-10-CM | POA: Diagnosis not present

## 2021-04-03 DIAGNOSIS — E119 Type 2 diabetes mellitus without complications: Secondary | ICD-10-CM | POA: Diagnosis not present

## 2021-04-03 NOTE — Patient Instructions (Signed)
Medication Instructions:  NO CHANGES   *If you need a refill on your cardiac medications before your next appointment, please call your pharmacy*   Follow-Up: At [facility], you and your health needs are our priority.  As part of our continuing mission to provide you with exceptional heart care, we have created designated Provider Care Teams.  These Care Teams include your primary Cardiologist (physician) and Advanced Practice Providers (APPs -  Physician Assistants and Nurse Practitioners) who all work together to provide you with the care you need, when you need it.  We recommend signing up for the patient portal called "MyChart".  Sign up information is provided on this After Visit Summary.  MyChart is used to connect with patients for Virtual Visits (Telemedicine).  Patients are able to view lab/test results, encounter notes, upcoming appointments, etc.  Non-urgent messages can be sent to your provider as well.   To learn more about what you can do with MyChart, go to [email or web address].    Your next appointment:   12 months with Dr. Debara Pickett    Other Instructions [facility] locations - [place] [facility] [employer]

## 2021-04-03 NOTE — Progress Notes (Signed)
OFFICE NOTE  Chief Complaint:  Follow-up  Primary Care Physician: Carlena Hurl, PA-C  HPI:  Shelly Blevins is a 76 year old female with a history of coronary artery disease with mild nonobstructive coronary disease and 50% (not 30% as previously reported) proximal circumflex lesion. She also has diabetes, insulin dependent, hypertension, and d chest yslipidemia. There is an unfortunate sudden-death history in her family with her sister dying at age 66 and a brother who died of heart failure at 75. Unfortunately, her husband recently died and he had a lot of medical problems in November of 2013. She has had problems with hypothyroidism and was off her thyroid medication for some time but that has been reestablished. Telemetry she is noted some increase in shortness of breath with activities. 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 She also is describing some discomfort in the left upper chest area into the left shoulder. This is actually short-lived but sometimes is worse with exertion and relieved by rest. It is concerning to me for possible angina.  I saw Mrs. Weigelt back in the office today. Overall she is doing well. She denies any chest pain or worsening shortness of breath. At her last office visit she underwent a nuclear stress test which was negative for ischemia. Her diabetes is been fairly well controlled. She recently saw Dr. Chalmers Cater who is her endocrinologist. Blood pressure is at goal. Cholesterol is also followed by her endocrinologist.  Mrs. Holway returns today for follow-up. In general she is doing quite well. She denies any chest pain or worsening shortness of breath. She denies any palpitations. EKG appears stable. Diabetes is been fairly well controlled. She is on high-dose atorvastatin.  07/10/2016  Mrs. Lever returns today for follow-up. Overall she seems to be doing well. Recently she's had some recurrent lightheadedness and  dizziness. She cause of vertigo however it's not clear that that's the issue. She has had some low blood sugars and her insulin was decreased somewhat. Since then her symptoms may been improved somewhat. Blood pressures recently well-controlled today.  03/15/2018  This is Madl is seen today in follow-up.  I asked her how she was feeling she said she is not feeling all that well.  She says at times she gets short of breath and fatigued really easily.  She does have a history of mild nonobstructive coronary disease by cath in the past however recently has had some worsening exertional symptoms.  She underwent stress testing last about 4 years ago which was negative for ischemia.  EKG today shows sinus rhythm with poor R wave progression and a rate of 62-personally reviewed  05/03/2018  Ms. Mccolm returns today for follow-up of her stress test.  This showed normal LV function with no reversible ischemia.  She reports she still gets short of breath with quick movements and significant exertion.  This could be related to some degree of diastolic dysfunction.  Blood pressure is a little bit elevated today.  02/15/2019  Ms. Gertsch is seen today in follow-up.  Overall she is doing well.  She denies any worsening shortness of breath or chest pain.  Her most recent hemoglobin A1c was 8.3.  She does have moderate coronary disease.  She is potentially good candidate for the coordinate diabetes study.  She is already on insulin as well as Janumet XR.  Despite this, she has not been able to achieve hemoglobin A1c less than 7 and still  cannot seem to get her weight lower.  Blood pressure is well controlled today.   08/14/2019  Ms. Gorby is seen today in follow-up.  Overall she reports being under a lot of stress.  She has no specific complaints but occasionally gets some right-sided chest discomfort.  She says her diabetes is not been optimally controlled although her last A1c was 7.5.  She is followed closely by Dr.  Michiel Sites.  She has not had recent repeat lipids.  She was seen in January 2021 by Roby Lofts, PA-C who added ezetimibe.  She is not had repeat lipids since then.  08/08/2020  Ms. Skeens returns today for follow-up.  She again has some complaints concerning about fatigue.  She gets some right-sided chest discomfort as described again above.  EKG shows normal sinus rhythm without any ischemia.  A1c is not significantly improved and remains at 7.5 however her cholesterol now is at goal with LDL at 57.  04/03/2021  Ms. Edenfield is doing well.  She had upper respiratory issues for several months in the fall but has overcome that.  She says she is interested in walking more and interested in going to a gym.  I am recommending the [facility] on [address]	[place].  She should be able to go there for free based on her Medicare coverage.  She denies any chest pain or shortness of breath.  Blood pressure is well controlled.  LDL at the beginning of 2022 was 57.  PMHx:  Past Medical History:  Diagnosis Date   Allergy    Arthritis    CAD (coronary artery disease)    mild nonobstructive disease, 30% Cfx lesion   Cataract    Diabetes mellitus    insulin-dependent; Dr. Chalmers Cater   Dyslipidemia    GERD (gastroesophageal reflux disease)    Hyperlipidemia    Hypertension    Hypothyroidism    Dr. Chalmers Cater   Stroke [facility])     Past Surgical History:  Procedure Laterality Date   ABDOMINAL HYSTERECTOMY     CARDIAC CATHETERIZATION  02/17/2007   mild CAD with 50% prox L Cfx stenosis, normal LV function (Dr. Norlene Duel)   [place] WALL MOTION  2009   lexiscan myoview - perfusion defect in anterior myociaral region (breast attenuation) - low risk scan - EF 69%   THYROIDECTOMY     TRANSTHORACIC ECHOCARDIOGRAM  2008   normal study     FAMHx:  Family History  Problem Relation Age of Onset   Heart failure Mother    Diabetes Mother    Sudden death Sister        died  at 45   Heart failure Brother        died at 15   Colon cancer Neg Hx    Esophageal cancer Neg Hx    Stomach cancer Neg Hx    Rectal cancer Neg Hx     SOCHx:   reports that she quit smoking about 35 years ago. Her smoking use included cigarettes. She has never used smokeless tobacco. She reports that she does not drink alcohol and does not use drugs.  ALLERGIES:  No Known Allergies  ROS: A comprehensive review of systems was negative.  HOME MEDS: Current Outpatient Medications  Medication Sig Dispense Refill   albuterol (VENTOLIN HFA) 108 (90 Base) MCG/ACT inhaler INHALE 2 PUFFS INTO THE LUNGS EVERY 6 (SIX) HOURS AS NEEDED FOR WHEEZING OR SHORTNESS OF  BREATH. 3 each 0   allopurinol (ZYLOPRIM) 100 MG tablet Take 100 mg by mouth daily.     amLODipine (NORVASC) 10 MG tablet TAKE 1 TABLET EVERY DAY 90 tablet 0   aspirin EC 81 MG tablet Take 1 tablet (81 mg total) by mouth daily. 90 tablet 3   atorvastatin (LIPITOR) 80 MG tablet Take 1 tablet (80 mg total) by mouth daily. 90 tablet 3   BD PEN NEEDLE NANO 2ND GEN 32G X 4 MM MISC 2 (two) times daily. as directed     benzonatate (TESSALON) 100 MG capsule Take 1 capsule (100 mg total) by mouth 2 (two) times daily as needed for cough. 20 capsule 0   cholecalciferol (VITAMIN D3) 25 MCG (1000 UNIT) tablet Take 1 tablet (1,000 Units total) by mouth daily. 90 tablet 3   ezetimibe (ZETIA) 10 MG tablet TAKE 1 TABLET BY MOUTH  DAILY 90 tablet 3   hydrochlorothiazide (HYDRODIURIL) 25 MG tablet TAKE 1 TABLET BY MOUTH  DAILY 90 tablet 3   latanoprost (XALATAN) 0.005 % ophthalmic solution Place 1 drop into both eyes at bedtime.  5   levothyroxine (SYNTHROID, LEVOTHROID) 50 MCG tablet Take 50 mcg by mouth daily before breakfast.     metFORMIN (GLUCOPHAGE-XR) 500 MG 24 hr tablet Take 2 tablets by mouth 2 (two) times daily.     Multiple Vitamins-Minerals (EMERGEN-C IMMUNE PLUS) PACK Take 1 tablet by mouth 2 (two) times daily. 10 each 0   NOVOLOG MIX 70/30  FLEXPEN (70-30) 100 UNIT/ML FlexPen Inject 60 Units into the skin 2 (two) times daily.   3   omeprazole (PRILOSEC) 40 MG capsule Take 40 mg by mouth every morning.     ONETOUCH VERIO test strip 3 (three) times daily.     predniSONE (DELTASONE) 5 MG tablet Take 5 mg by mouth daily.     ramipril (ALTACE) 10 MG capsule TAKE 1 CAPSULE BY MOUTH  DAILY 90 capsule 0   Vitamin D, Ergocalciferol, (DRISDOL) 1.25 MG (50000 UNIT) CAPS capsule Take by mouth.     No current facility-administered medications for this visit.    LABS/IMAGING: No results found for this or any previous visit (from the past 48 hour(s)). No results found.  VITALS: BP 126/60 (BP Location: Left Arm, Patient Position: Sitting, Cuff Size: Normal)    Pulse (!) 56    Ht 5\' 2"  (1.575 m)    Wt 184 lb (83.5 kg)    LMP  (LMP Unknown)    BMI 33.65 kg/m   EXAM: General appearance: alert and no distress Neck: no carotid bruit, no JVD and thyroid not enlarged, symmetric, no tenderness/mass/nodules Lungs: clear to auscultation bilaterally Heart: regular rate and rhythm, S1, S2 normal, no murmur, click, rub or gallop Abdomen: soft, non-tender; bowel sounds normal; no masses,  no organomegaly Extremities: extremities normal, atraumatic, no cyanosis or edema Pulses: 2+ and symmetric Skin: Skin color, texture, turgor normal. No rashes or lesions Neurologic: Grossly normal Psych: Pleasant  EKG: Sinus bradycardia Marked sinus arrhythmia at 56 -personally reviewed  ASSESSMENT: History of fatigue, chest tightness and dyspnea -low risk Myoview stress test, normal LV function  (03/2018) Moderate single vessel coronary disease with a 50% circumflex lesion and 2009 - negative nuclear stress test in 2015 Insulin-dependent diabetes, A1c 7.5 Hypertension  Dyslipidemia, goal LDL<70 Family history of premature sudden cardiac death  PLAN: 1.   Mrs. Seman seems to be doing better.  She is struggled with some upper respiratory issues in the fall  but it has resolved.  She denies any chest pain.  She says she has some issues checking her blood sugar regularly because she forgets where the meter is.  She is due for repeat blood work from her primary care provider.  She denies any chest pain or dyspnea.  I have encouraged more physical activity and she hopes to enroll in the [facility] which would be at no cost to her.  Follow-up with me annually or sooner as necessary.  Pixie Casino, MD, [facility], [place] Director of the Advanced Lipid Disorders &  Cardiovascular Risk Reduction Clinic Diplomate of the American Board of Clinical Lipidology Attending Cardiologist  Direct Dial: [phone]   Fax: [phone]  Website:  [email or web address] 04/03/2021, 8:58 AM

## 2021-04-28 ENCOUNTER — Telehealth: Payer: Self-pay | Admitting: Medical

## 2021-04-28 ENCOUNTER — Other Ambulatory Visit: Payer: Self-pay

## 2021-04-28 ENCOUNTER — Ambulatory Visit (INDEPENDENT_AMBULATORY_CARE_PROVIDER_SITE_OTHER): Payer: Medicare Other | Admitting: Medical

## 2021-04-28 VITALS — BP 120/64 | HR 61 | Ht 64.0 in | Wt 184.6 lb

## 2021-04-28 DIAGNOSIS — E1142 Type 2 diabetes mellitus with diabetic polyneuropathy: Secondary | ICD-10-CM | POA: Diagnosis not present

## 2021-04-28 DIAGNOSIS — D649 Anemia, unspecified: Secondary | ICD-10-CM

## 2021-04-28 DIAGNOSIS — M170 Bilateral primary osteoarthritis of knee: Secondary | ICD-10-CM

## 2021-04-28 DIAGNOSIS — E559 Vitamin D deficiency, unspecified: Secondary | ICD-10-CM

## 2021-04-28 DIAGNOSIS — R413 Other amnesia: Secondary | ICD-10-CM

## 2021-04-28 DIAGNOSIS — E785 Hyperlipidemia, unspecified: Secondary | ICD-10-CM | POA: Diagnosis not present

## 2021-04-28 DIAGNOSIS — Z7185 Encounter for immunization safety counseling: Secondary | ICD-10-CM

## 2021-04-28 DIAGNOSIS — Z0000 Encounter for general adult medical examination without abnormal findings: Secondary | ICD-10-CM | POA: Diagnosis not present

## 2021-04-28 DIAGNOSIS — E1169 Type 2 diabetes mellitus with other specified complication: Secondary | ICD-10-CM

## 2021-04-28 DIAGNOSIS — K219 Gastro-esophageal reflux disease without esophagitis: Secondary | ICD-10-CM

## 2021-04-28 DIAGNOSIS — Z794 Long term (current) use of insulin: Secondary | ICD-10-CM

## 2021-04-28 DIAGNOSIS — R6889 Other general symptoms and signs: Secondary | ICD-10-CM

## 2021-04-28 DIAGNOSIS — I2510 Atherosclerotic heart disease of native coronary artery without angina pectoris: Secondary | ICD-10-CM

## 2021-04-28 DIAGNOSIS — I10 Essential (primary) hypertension: Secondary | ICD-10-CM

## 2021-04-28 DIAGNOSIS — E2839 Other primary ovarian failure: Secondary | ICD-10-CM

## 2021-04-28 DIAGNOSIS — I2583 Coronary atherosclerosis due to lipid rich plaque: Secondary | ICD-10-CM

## 2021-04-28 DIAGNOSIS — E039 Hypothyroidism, unspecified: Secondary | ICD-10-CM

## 2021-04-28 DIAGNOSIS — Z7189 Other specified counseling: Secondary | ICD-10-CM

## 2021-04-28 DIAGNOSIS — Z780 Asymptomatic menopausal state: Secondary | ICD-10-CM

## 2021-04-28 MED ORDER — AMLODIPINE BESYLATE 10 MG PO TABS: 10.0000 mg | ORAL_TABLET | Freq: Every day | ORAL | 0 refills | Status: DC

## 2021-04-28 MED ORDER — SPIRONOLACTONE 25 MG PO TABS: 12.5000 mg | ORAL_TABLET | Freq: Every day | ORAL | 2 refills | Status: DC

## 2021-04-28 NOTE — Telephone Encounter (Signed)
Pt was notified of results

## 2021-04-28 NOTE — Patient Instructions (Addendum)
Recommendations:  See your eye doctor yearly for diabetic eye exam and make sure they send [place] a copy of the note  We are going to refer you to neurology for consult on memory  When you see your diabetes doctor periodically, please ask them to send [place] a copy of your labs as noted.  They have not been doing a good job of sending [place] your records  Vaccines Please call your insurance and ask about coverage for the following vaccines that you are due: Updated shingles vaccine Tdap, tetanus diphtheria pertussis vaccine  Please get [place] a copy of your advance directives, healthcare power of attorney and living well  I am going to talk to your heart doctor about stopping the hydrochlorothiazide since that may be affecting your uric acid and gout   Bone health: Get at least 150 minutes of aerobic exercise weekly Get weight bearing exercise at least once weekly Bone density test:  A bone density test is an imaging test that uses a type of X-ray to measure the amount of calcium and other minerals in your bones. The test may be used to diagnose or screen you for a condition that causes weak or thin bones (osteoporosis), predict your risk for a broken bone (fracture), or determine how well your osteoporosis treatment is working. The bone density test is recommended for females 87 and older, or females or males <67 if certain risk factors such as thyroid disease, long term use of steroids such as for asthma or rheumatological issues, vitamin D deficiency, estrogen deficiency, family history of osteoporosis, self or family history of fragility fracture in first degree relative.  I reviewed her bone density test from June 2022 which was normal.

## 2021-04-28 NOTE — Telephone Encounter (Signed)
I spoke to her heart doctor.  He gave me permission to make the following changes:  Have her stop hydrochlorothiazide HCTZ 25 mg daily. Begin Aldactone 25 mg however use 1/2 tablet daily or 12.5 mg daily.  this is a different fluid pill that should not give [place] as much issue with the gout I will need her to come back in 2 weeks to recheck her kidney levels and electrolytes  Please make sure to let Dr. Dossie Der, her rheumatologist, know that we stopped the HCTZ because of her recent elevated uric acid and gout issues

## 2021-04-28 NOTE — Progress Notes (Addendum)
Subjective:    Shelly Blevins is a 76 y.o. female who presents for Preventative Services visit and chronic medical problems/med check visit.    Primary Care Provider Tysinger, Camelia Eng, PA-C here for primary care  Current Health Care Team: Dentist, none, has full dentures Eye doctor, Dr. Dr. Herbert Deaner, Dr. Kathlen Mody Dr. Jacelyn Pi, endocrinology Dr. Lyman Bishop, cardiology Dr. Scarlette Shorts, GI Dr. Sabino Niemann, rheumatology Leontine Locket, [place] and Dr. Harold Barban, vascular surgery   Medical Services you may have received from other than [facility] providers in the past year (date may be approximate) Dr. Debara Pickett- cardiology, endo  Exercise Current exercise habits:  moving around the house    Nutrition/Diet Current diet: in general, a "healthy" diet    Depression Screen Depression screen [facility] 2/9 04/28/2021  Decreased Interest 0  Down, Depressed, Hopeless 0  PHQ - 2 Score 0  Altered sleeping 0  Tired, decreased energy 0  Change in appetite 0  Feeling bad or failure about yourself  1  Trouble concentrating 0  Moving slowly or fidgety/restless 0  Suicidal thoughts 0  PHQ-9 Score 1  Difficult doing work/chores Not difficult at all    Activities of Daily Living Screen/Functional Status Survey Is the patient deaf or have difficulty hearing?: No Does the patient have difficulty seeing, even when wearing glasses/contacts?: No Does the patient have difficulty concentrating, remembering, or making decisions?: Yes (remembering) Does the patient have difficulty walking or climbing stairs?: No Does the patient have difficulty dressing or bathing?: No Does the patient have difficulty doing errands alone such as visiting a doctor's office or shopping?: No  Can patient draw a clock face showing 3:15 oclock, yes  Fall Risk Screen Fall Risk  04/28/2021 11/25/2020 04/25/2020 03/28/2019 02/09/2018  Falls in the past year? 0 0 0 0 0  Number falls in past yr: 0 0 - - -  Injury with Fall? 0 0 - - -   Comment - - - - -  Risk for fall due to : No Fall Risks No Fall Risks No Fall Risks - -  Follow up Falls evaluation completed Falls evaluation completed Falls evaluation completed - -    Gait Assessment: Normal gait observed yes  Advanced directives Does patient have a [place]? Yes Does patient have a Living Will? Yes  Past Medical History:  Diagnosis Date   Allergy    Arthritis    CAD (coronary artery disease)    mild nonobstructive disease, 30% Cfx lesion   Cataract    Diabetes mellitus    insulin-dependent; Dr. Chalmers Cater   Dyslipidemia    GERD (gastroesophageal reflux disease)    Hyperlipidemia    Hypertension    Hypothyroidism    Dr. Chalmers Cater   Stroke [facility])     Past Surgical History:  Procedure Laterality Date   ABDOMINAL HYSTERECTOMY     CARDIAC CATHETERIZATION  02/17/2007   mild CAD with 50% prox L Cfx stenosis, normal LV function (Dr. Norlene Duel)   [place] WALL MOTION  2009   lexiscan myoview - perfusion defect in anterior myociaral region (breast attenuation) - low risk scan - EF 69%   THYROIDECTOMY     TRANSTHORACIC ECHOCARDIOGRAM  2008   normal study     Social History   Socioeconomic History   Marital status: Married    Spouse name: Not on file   Number of children: 2  Years of education: 10   Highest education level: Not on file  Occupational History    Employer: [employer]  Tobacco Use   Smoking status: Former    Types: Cigarettes    Quit date: 04/02/1986    Years since quitting: 35.0   Smokeless tobacco: Never  Vaping Use   Vaping Use: Never used  Substance and Sexual Activity   Alcohol use: No   Drug use: No   Sexual activity: Not on file  Other Topics Concern   Not on file  Social History Narrative   Not on file   Social Determinants of Health   Financial Resource Strain: Low Risk    Difficulty of Paying Living Expenses: Not very hard  Food Insecurity: No Food  Insecurity   Worried About Running Out of Food in the Last Year: Never true   [place] in the Last Year: Never true  Transportation Needs: No Transportation Needs   Lack of Transportation (Medical): No   Lack of Transportation (Non-Medical): No  Physical Activity: Not on file  Stress: Not on file  Social Connections: Not on file  Intimate Partner Violence: Not on file    Family History  Problem Relation Age of Onset   Heart failure Mother    Diabetes Mother    Sudden death Sister        died at 11   Heart failure Brother        died at 79   Colon cancer Neg Hx    Esophageal cancer Neg Hx    Stomach cancer Neg Hx    Rectal cancer Neg Hx      Current Outpatient Medications:    albuterol (VENTOLIN HFA) 108 (90 Base) MCG/ACT inhaler, INHALE 2 PUFFS INTO THE LUNGS EVERY 6 (SIX) HOURS AS NEEDED FOR WHEEZING OR SHORTNESS OF BREATH., Disp: 3 each, Rfl: 0   allopurinol (ZYLOPRIM) 100 MG tablet, Take 100 mg by mouth daily., Disp: , Rfl:    amLODipine (NORVASC) 10 MG tablet, TAKE 1 TABLET EVERY DAY, Disp: 90 tablet, Rfl: 0   aspirin EC 81 MG tablet, Take 1 tablet (81 mg total) by mouth daily., Disp: 90 tablet, Rfl: 3   atorvastatin (LIPITOR) 80 MG tablet, Take 1 tablet (80 mg total) by mouth daily., Disp: 90 tablet, Rfl: 3   BD PEN NEEDLE NANO 2ND GEN 32G X 4 MM MISC, 2 (two) times daily. as directed, Disp: , Rfl:    cholecalciferol (VITAMIN D3) 25 MCG (1000 UNIT) tablet, Take 1 tablet (1,000 Units total) by mouth daily., Disp: 90 tablet, Rfl: 3   ezetimibe (ZETIA) 10 MG tablet, TAKE 1 TABLET BY MOUTH  DAILY, Disp: 90 tablet, Rfl: 3   hydrochlorothiazide (HYDRODIURIL) 25 MG tablet, TAKE 1 TABLET BY MOUTH  DAILY, Disp: 90 tablet, Rfl: 3   latanoprost (XALATAN) 0.005 % ophthalmic solution, Place 1 drop into both eyes at bedtime., Disp: , Rfl: 5   levothyroxine (SYNTHROID, LEVOTHROID) 50 MCG tablet, Take 50 mcg by mouth daily before breakfast., Disp: , Rfl:    metFORMIN  (GLUCOPHAGE-XR) 500 MG 24 hr tablet, Take 2 tablets by mouth 2 (two) times daily., Disp: , Rfl:    Multiple Vitamins-Minerals (EMERGEN-C IMMUNE PLUS) PACK, Take 1 tablet by mouth 2 (two) times daily., Disp: 10 each, Rfl: 0   NOVOLOG MIX 70/30 FLEXPEN (70-30) 100 UNIT/ML FlexPen, Inject 60 Units into the skin 2 (two) times daily. , Disp: , Rfl: 3   omeprazole (PRILOSEC) 40  MG capsule, Take 40 mg by mouth every morning., Disp: , Rfl:    predniSONE (DELTASONE) 5 MG tablet, Take 5 mg by mouth daily., Disp: , Rfl:    ramipril (ALTACE) 10 MG capsule, TAKE 1 CAPSULE BY MOUTH  DAILY, Disp: 90 capsule, Rfl: 0   Vitamin D, Ergocalciferol, (DRISDOL) 1.25 MG (50000 UNIT) CAPS capsule, Take by mouth., Disp: , Rfl:    ONETOUCH VERIO test strip, 3 (three) times daily., Disp: , Rfl:   No Known Allergies  History reviewed: allergies, current medications, past family history, past medical history, past social history, past surgical history and problem list  Chronic issues discussed: Diabetes-sees Dr. Chalmers Cater regularly, compliant with medication, no recent concerns  Hypothyroidism-sees Dr. Chalmers Cater regularly, compliant with medication  Hyperlipidemia-compliant with medication  High blood pressure-compliant with medication  Acute issues discussed: Memory concerns, mild.   The symptoms forgets things if she placed in the house.  She does not get confused about making her way back, and driving.  She is not leaving burners on the house.  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  She has them full-time  Objective:      Biometrics BP 120/64    Pulse 61    Ht 5\' 4"  (7.616 m)    Wt 184 lb 9.6 oz (83.7 kg)    LMP  (LMP Unknown)    BMI 31.69 kg/m   Wt Readings from Last 3 Encounters:  04/28/21 184 lb 9.6 oz (83.7 kg)  04/03/21 184 lb (83.5 kg)  02/10/21 185 lb (83.9 kg)    Gen: wd, wn, nad Alert and oriented x 3 Answers questions appropriately HEENT: normocephalic, sclerae  anicteric Neck: supple, no lymphadenopathy, no thyromegaly, no masses, no bruits Heart: RRR, normal S1, S2, no murmurs Lungs: CTA bilaterally, no wheezes, rhonchi, or rales Abdomen: +bs, soft, non tender, non distended, no masses, no hepatomegaly, no splenomegaly Musculoskeletal: nontender, no swelling, no obvious deformity Extremities: no edema, no cyanosis, no clubbing Pulses: 2+ symmetric, upper and lower extremities, normal cap refill Neurological: alert, oriented x 3, CN2-12 intact, strength normal upper extremities and lower extremities, sensation normal throughout, DTRs 2+ throughout, no cerebellar signs, gait normal Psychiatric: normal affect, behavior normal, pleasant  Breast/GU/rectal declined  Foot exam - deferred to endocrinology at her request   Assessment:   Encounter Diagnoses  Name Primary?   Encounter for health maintenance examination in adult Yes   Medicare annual wellness visit, subsequent    Memory change    Dyslipidemia    Diabetic polyneuropathy associated with type 2 diabetes mellitus ([place])    Coronary artery disease due to lipid rich plaque    Anemia, unspecified type    Advanced directives, counseling/discussion    Abnormal ankle brachial index (ABI)    Vitamin D deficiency    Vaccine counseling    Post-menopausal    Osteoarthritis of both knees, unspecified osteoarthritis type    Long term (current) use of insulin (HCC)    Hypothyroidism, unspecified type    Gastroesophageal reflux disease, unspecified whether esophagitis present    Estrogen deficiency    Essential hypertension    Type 2 diabetes mellitus with other specified complication, with long-term current use of insulin ([place])      Plan:   This visit was a preventative care visit, also known as wellness visit or routine physical.   Topics typically include healthy lifestyle, diet, exercise, preventative care, vaccinations, sick and well care, proper use of emergency dept and  after hours  care, as well as other concerns.     Recommendations: Continue to return yearly for your annual wellness and preventative care visits.  This gives [place] a chance to discuss healthy lifestyle, exercise, vaccinations, review your chart record, and perform screenings where appropriate.  I recommend you see your eye doctor yearly for routine vision care.    Vaccination recommendations were reviewed Immunization History  Administered Date(s) Administered   Fluad Quad(high Dose 65+) 01/10/2020, 11/25/2020   Influenza-Unspecified 12/31/2017   Moderna Sars-Covid-2 Vaccination 04/01/2019, 04/29/2019, 01/10/2020   Pneumococcal Conjugate-13 10/09/2015   Pneumococcal Polysaccharide-23 04/25/2020   Zoster, Live 12/09/2015   I recommend you check insurance coverage for Shingrix, Tdap vaccine as you are due for both   Screening for cancer: Skin cancer screening: Check your skin regularly for new changes, growing lesions, or other lesions of concern Come in for evaluation if you have skin lesions of concern.  Lung cancer screening: If you have a greater than 20 pack year history of tobacco use, then you may qualify for lung cancer screening with a chest CT scan.   Please call your insurance company to inquire about coverage for this test.  We currently don't have screenings for other cancers besides breast, cervical, colon, and lung cancers.  If you have a strong family history of cancer or have other cancer screening concerns, please let me know.    Bone health: Get at least 150 minutes of aerobic exercise weekly Get weight bearing exercise at least once weekly Bone density test:  A bone density test is an imaging test that uses a type of X-ray to measure the amount of calcium and other minerals in your bones. The test may be used to diagnose or screen you for a condition that causes weak or thin bones (osteoporosis), predict your risk for a broken bone (fracture), or determine how well your  osteoporosis treatment is working. The bone density test is recommended for females 16 and older, or females or males <33 if certain risk factors such as thyroid disease, long term use of steroids such as for asthma or rheumatological issues, vitamin D deficiency, estrogen deficiency, family history of osteoporosis, self or family history of fragility fracture in first degree relative.  I reviewed her bone density test from June 2022 which was normal.   Heart health: Get at least 150 minutes of aerobic exercise weekly Limit alcohol It is important to maintain a healthy blood pressure and healthy cholesterol numbers  Heart disease screening: Screening for heart disease includes screening for blood pressure, fasting lipids, glucose/diabetes screening, BMI height to weight ratio, reviewed of smoking status, physical activity, and diet.    Goals include blood pressure 120/80 or less, maintaining a healthy lipid/cholesterol profile, preventing diabetes or keeping diabetes numbers under good control, not smoking or using tobacco products, exercising most days per week or at least 150 minutes per week of exercise, and eating healthy variety of fruits and vegetables, healthy oils, and avoiding unhealthy food choices like fried food, fast food, high sugar and high cholesterol foods.      Medical care options: I recommend you continue to seek care here first for routine care.  We try really hard to have available appointments Monday through Friday daytime hours for sick visits, acute visits, and physicals.  Urgent care should be used for after hours and weekends for significant issues that cannot wait till the next day.  The emergency department should be used for significant potentially life-threatening emergencies.  The emergency department is expensive, can often have long wait times for less significant concerns, so try to utilize primary care, urgent care, or telemedicine when possible to avoid  unnecessary trips to the emergency department.  Virtual visits and telemedicine have been introduced since the pandemic started in 2020, and can be convenient ways to receive medical care.  We offer virtual appointments as well to assist you in a variety of options to seek medical care.   Advanced Directives: Please get a copy of your recent advance directives to [place]   Other Significant issues: Diabetes, hypothyroidism -we will try to get a copy of her most recent labs and notes from endocrinology  Dyslipidemia-updated labs today, continue medications  Hypertension-I am going to reach out to cardiology about decreasing or stopping hydrochlorothiazide since it is probably playing a role in her elevated uric acid and gout issues  Memory concerns mild-referral to neurology, additional labs today in this regard  Abnormal ABI-I reviewed her October 2022 ABI report and vascular surgery notes.  Advise regular exercise and walking  Obesity-recommended she work on dietary changes, exercise more than she is doing.  I also asked her to check with her diabetes doctor about considering medications that can help with weight loss as well  I reviewed her eye doctor report from November 2022.  No diabetic retinopathy  CAD - c/t statin, zetia  Anemia - of chronic disease.  Hemoccult negative 11/2020, reviewed 2020 colonoscopy, reviewed iron studies from 8/22  Vit D deficiency - continue supplement  GERD- no recent c/o, doing fine on current therapy  I reviewed her rheumatology notes from December 2022.  Her uric acid was elevated.  She was started on allopurinol 100 mg daily she is also on prednisone for osteoarthritis.  She had some lab work done including comprehensive metabolic C-reactive protein sed rate and blood count that was done in February 03, 2021.  At that time creatinine was 1.1 no, GFR 56, glucose 140, rest of comprehensive metabolic was normal.  Her hemoglobin was stable at 10.6.  Uric acid  was 8.5 elevated  Ronin was seen today for fasting cpe/awv.  Diagnoses and all orders for this visit:  Encounter for health maintenance examination in adult -     Vitamin B12 -     Lipid panel -     Urinalysis, Routine w reflex microscopic -     Microalbumin/Creatinine Ratio, Urine -     Hepatitis C antibody -     HIV Antibody (routine testing w rflx) -     RPR  Medicare annual wellness visit, subsequent  Memory change -     Vitamin B12 -     Hepatitis C antibody -     HIV Antibody (routine testing w rflx) -     RPR -     Ambulatory referral to Neurology  Dyslipidemia -     Lipid panel  Diabetic polyneuropathy associated with type 2 diabetes mellitus ([place])  Coronary artery disease due to lipid rich plaque  Anemia, unspecified type  Advanced directives, counseling/discussion  Abnormal ankle brachial index (ABI)  Vitamin D deficiency  Vaccine counseling  Post-menopausal  Osteoarthritis of both knees, unspecified osteoarthritis type  Long term (current) use of insulin (HCC)  Hypothyroidism, unspecified type  Gastroesophageal reflux disease, unspecified whether esophagitis present  Estrogen deficiency  Essential hypertension  Type 2 diabetes mellitus with other specified complication, with long-term current use of insulin (HCC) -     Microalbumin/Creatinine Ratio, Urine  Medicare Attestation A preventative services visit was completed today.  During the course of the visit the patient was educated and counseled about appropriate screening and preventive services.  A health risk assessment was established with the patient that included a review of current medications, allergies, social history, family history, medical and preventative health history, biometrics, and preventative screenings to identify potential safety concerns or impairments.  A personalized plan was printed today for the patient's records and use.   Personalized health advice and  education was given today to reduce health risks and promote self management and wellness.  Information regarding end of life planning was discussed today.  Dorothea Ogle, PA-C   04/28/2021

## 2021-04-28 NOTE — Addendum Note (Signed)
Addended by: Carlena Hurl on: 04/28/2021 01:10 PM   Modules accepted: Orders

## 2021-04-28 NOTE — Progress Notes (Signed)
Sure .. would be ok with me for you to stop HCTZ and order aldactone. Would recommend low dose 12.5 mg daily and repeat BMET in 1-2 weeks after switch.   Thanks.  Mali

## 2021-04-29 ENCOUNTER — Other Ambulatory Visit: Payer: Self-pay | Admitting: Medical

## 2021-04-29 DIAGNOSIS — A530 Latent syphilis, unspecified as early or late: Secondary | ICD-10-CM

## 2021-04-30 ENCOUNTER — Other Ambulatory Visit: Payer: Self-pay | Admitting: Medical

## 2021-04-30 DIAGNOSIS — A530 Latent syphilis, unspecified as early or late: Secondary | ICD-10-CM

## 2021-04-30 LAB — LIPID PANEL
Chol/HDL Ratio: 2.5 ratio (ref 0.0–4.4)
Cholesterol, Total: 129 mg/dL (ref 100–199)
HDL: 52 mg/dL (ref >39)
LDL Chol Calc (NIH): 59 mg/dL (ref 0–99)
Triglycerides: 93 mg/dL (ref 0–149)
VLDL Cholesterol Cal: 18 mg/dL (ref 5–40)

## 2021-04-30 LAB — VITAMIN B12: Vitamin B-12: 1081 pg/mL (ref 232–1245)

## 2021-04-30 LAB — URINALYSIS, ROUTINE W REFLEX MICROSCOPIC

## 2021-04-30 LAB — MICROALBUMIN / CREATININE URINE RATIO
Creatinine, Urine: 260.8 mg/dL
Microalb/Creat Ratio: 8 mg/g creat (ref 0–29)
Microalbumin, Urine: 20.4 ug/mL

## 2021-04-30 LAB — HEPATITIS C ANTIBODY: Hep C Virus Ab: <0.1 s/co ratio (ref 0.0–0.9)

## 2021-04-30 LAB — RPR, QUANT+TP ABS (REFLEX)
Rapid Plasma Reagin, Quant: 1:1 {titer} — ABNORMAL HIGH
T Pallidum Abs: NONREACTIVE

## 2021-04-30 LAB — RPR: RPR Ser Ql: REACTIVE — AB

## 2021-04-30 LAB — HIV ANTIBODY (ROUTINE TESTING W REFLEX): HIV Screen 4th Generation wRfx: NONREACTIVE

## 2021-05-12 ENCOUNTER — Other Ambulatory Visit: Payer: Self-pay

## 2021-05-12 ENCOUNTER — Ambulatory Visit (INDEPENDENT_AMBULATORY_CARE_PROVIDER_SITE_OTHER): Payer: Medicare HMO | Admitting: Medical

## 2021-05-12 VITALS — BP 110/64 | HR 71 | Wt 183.4 lb

## 2021-05-12 DIAGNOSIS — M1A9XX0 Chronic gout, unspecified, without tophus (tophi): Secondary | ICD-10-CM | POA: Diagnosis not present

## 2021-05-12 DIAGNOSIS — R109 Unspecified abdominal pain: Secondary | ICD-10-CM | POA: Diagnosis not present

## 2021-05-12 DIAGNOSIS — I10 Essential (primary) hypertension: Secondary | ICD-10-CM

## 2021-05-12 DIAGNOSIS — E790 Hyperuricemia without signs of inflammatory arthritis and tophaceous disease: Secondary | ICD-10-CM | POA: Diagnosis not present

## 2021-05-12 DIAGNOSIS — Z79899 Other long term (current) drug therapy: Secondary | ICD-10-CM | POA: Diagnosis not present

## 2021-05-12 DIAGNOSIS — Z7185 Encounter for immunization safety counseling: Secondary | ICD-10-CM

## 2021-05-12 DIAGNOSIS — R350 Frequency of micturition: Secondary | ICD-10-CM

## 2021-05-12 DIAGNOSIS — R413 Other amnesia: Secondary | ICD-10-CM

## 2021-05-12 LAB — POCT URINALYSIS DIP (PROADVANTAGE DEVICE)
Blood, UA: NEGATIVE
Glucose, UA: NEGATIVE mg/dL
Ketones, POC UA: NEGATIVE mg/dL
Leukocytes, UA: NEGATIVE
Nitrite, UA: NEGATIVE
Protein Ur, POC: NEGATIVE mg/dL
Specific Gravity, Urine: 1.02
Urobilinogen, Ur: NEGATIVE
pH, UA: 6 (ref 5.0–8.0)

## 2021-05-12 NOTE — Progress Notes (Signed)
Subjective:  Shelly Blevins is a 76 y.o. female who presents for Chief Complaint  Patient presents with   follow-up    Follow-up on kidney levels and elextrocyles      Primary Care Provider Charmon Thorson, Camelia Eng, PA-C here for primary care   Current Health Care Team: Dentist, none, has full dentures Eye doctor, Dr. Dr. Herbert Deaner, Dr. Kathlen Mody Dr. Jacelyn Pi, endocrinology Dr. Lyman Bishop, cardiology Dr. Scarlette Shorts, GI Dr. Sabino Niemann, rheumatology Leontine Locket, [place] and Dr. Harold Barban, vascular surgery  Here for recheck from recent physical visit on April 28, 2021  On last visit I had reviewed her rheumatology notes from December 2022.  Her uric acid was elevated.  She was started on allopurinol 100 mg daily she is also on prednisone for osteoarthritis.  She had some lab work done including comprehensive metabolic C-reactive protein sed rate and blood count that was done in February 03, 2021.  At that time creatinine was 1.1 no, GFR 56, glucose 140, rest of comprehensive metabolic was normal.  Her hemoglobin was stable at 10.6.  Uric acid was 8.5 elevated  After that visit I had reached out to cardiology about stopping hydrochlorothiazide given the gout and uric acid issues.  Dr. Debara Pickett was okay with her changing to spironolactone which she has done.  She is here to recheck electrolytes today.  Last visit I asked her to check on insurance coverage for shingles and tetanus/Tdap vaccines.  She is not sure.  Today her insurance verification suggests she has a different insurance compared to last visit.  She told the nurse after after I had finished the visit while waiting for labs that she was having some belly discomfort on the right, concerned about UTI.  No other aggravating or relieving factors.    No other c/o.  The following portions of the patient's history were reviewed and updated as appropriate: allergies, current medications, past family history, past medical history, past  social history, past surgical history and problem list.  ROS Otherwise as in subjective above  Objective: BP 110/64    Pulse 71    Wt 183 lb 6.4 oz (83.2 kg)    LMP  (LMP Unknown)    BMI 31.48 kg/m   General appearance: alert, no distress, well developed, well nourished    Assessment: Encounter Diagnoses  Name Primary?   Elevated uric acid in blood Yes   Chronic gout without tophus, unspecified cause, unspecified site    Essential hypertension    High risk medication use    Vaccine counseling    Memory change    Abdominal discomfort    Urine frequency      Plan: Hypertension-continue current medications.  Last visit we discontinued hydrochlorothiazide given elevated uric acid.  Here to recheck basic metabolic panel today to check electrolytes.  Blood pressure looks fine.  We changed to spironolactone off of hydrochlorothiazide last visit.  Continue amlodipine 10 mg daily, ramipril 10 mg daily  Chronic gout, elevated uric acid-continue allopurinol per rheumatology.  Recently discontinued hydrochlorothiazide.  Recheck electrolytes today.  Memory loss/memory concerns-  Last visit she had concerns about memory.  We placed referral to neurology.  That appointment is still pending.  I checked some labs given those symptoms and RPR came back reactive but negative for treponema antibodies.  Continue plan to see neurology.  Vaccine counseling-I encouraged her to check insurance coverage about shingles and Tdap vaccine as she is due  Abdominal tenderness - urinalysis mostly unremarkable.  F/u pending culture   Dane was seen today for follow-up.  Diagnoses and all orders for this visit:  Elevated uric acid in blood  Chronic gout without tophus, unspecified cause, unspecified site  Essential hypertension -     Basic metabolic panel  High risk medication use  Vaccine counseling  Memory change  Abdominal discomfort -     POCT Urinalysis DIP (Proadvantage Device) -      Urine Culture  Urine frequency    Follow up: pending labs

## 2021-05-12 NOTE — Patient Instructions (Addendum)
Your current medication list is as follows:  High blood pressure Amlodipine 10 mg, 1 tablet daily Ramipril 10 mg, 1 capsule daily Spironolactone 25mg  , 1/2 tablet daily.  This is the new medication we started last visit  Hypothyroidism Levothyroxine 50 mcg, 1 tablet daily in the morning  High cholesterol Aspirin 81 mg, 1 tablet daily Atorvastatin Lipitor 80 mg, 1 tablet daily Ezetimibe, Zetia 10 mg, 1 tablet daily   Eyedrops Latanoprost/Xalatan drops per eye doctor   Vitamin D, 1000 units daily   Diabetes Metformin ER 500 mg, 2 tablets twice daily NovoLog insulin per your diabetes specialist   Acid reflux Omeprazole, prostate 40 mg daily   Albuterol inhaler, 1-2 puffs, 2 every 4-6 hours as needed   Prednisone steroid per specialist

## 2021-05-13 ENCOUNTER — Other Ambulatory Visit: Payer: Self-pay | Admitting: Medical

## 2021-05-13 ENCOUNTER — Telehealth: Payer: Self-pay | Admitting: Medical

## 2021-05-13 LAB — BASIC METABOLIC PANEL
BUN/Creatinine Ratio: 22 (ref 12–28)
BUN: 25 mg/dL (ref 8–27)
CO2: 22 mmol/L (ref 20–29)
Calcium: 10 mg/dL (ref 8.7–10.3)
Chloride: 103 mmol/L (ref 96–106)
Creatinine, Ser: 1.13 mg/dL — ABNORMAL HIGH (ref 0.57–1.00)
Glucose: 37 mg/dL — CL (ref 70–99)
Potassium: 3.8 mmol/L (ref 3.5–5.2)
Sodium: 144 mmol/L (ref 134–144)
eGFR: 50 mL/min/{1.73_m2} — ABNORMAL LOW (ref >59)

## 2021-05-13 MED ORDER — GVOKE HYPOPEN 2-PACK 1 MG/0.2ML ~~LOC~~ SOAJ: 1.0000 | SUBCUTANEOUS | 1 refills | Status: DC | PRN

## 2021-05-13 NOTE — Telephone Encounter (Signed)
Spoke to patient

## 2021-05-13 NOTE — Telephone Encounter (Signed)
See other urgent message from this morning   of note, I called patient twice this morning both times got voicemail.  She lives with her daughter and grandchildren so I know somebody is typically  there with her  We got a call around 450 this morning from the lab about a low sugar reading.  I called her initially around 5 AM and had to leave a voicemail

## 2021-05-15 ENCOUNTER — Other Ambulatory Visit: Payer: Self-pay | Admitting: Medical

## 2021-05-15 LAB — URINE CULTURE

## 2021-05-15 MED ORDER — SULFAMETHOXAZOLE-TRIMETHOPRIM 800-160 MG PO TABS: 1.0000 | ORAL_TABLET | Freq: Two times a day (BID) | ORAL | 0 refills | Status: DC

## 2021-05-28 ENCOUNTER — Other Ambulatory Visit: Payer: HMO

## 2021-05-28 ENCOUNTER — Other Ambulatory Visit: Payer: Self-pay

## 2021-05-28 DIAGNOSIS — A530 Latent syphilis, unspecified as early or late: Secondary | ICD-10-CM

## 2021-05-29 LAB — RPR: RPR Ser Ql: REACTIVE — AB

## 2021-05-29 LAB — FLUORESCENT TREPONEMAL AB(FTA)-IGG-BLD: Fluorescent Treponemal Ab, IgG: NONREACTIVE

## 2021-05-29 LAB — RPR, QUANT+TP ABS (REFLEX)
Rapid Plasma Reagin, Quant: 1:1 {titer} — ABNORMAL HIGH
T Pallidum Abs: NONREACTIVE

## 2021-06-04 ENCOUNTER — Other Ambulatory Visit: Payer: Self-pay | Admitting: Medical

## 2021-06-25 ENCOUNTER — Ambulatory Visit: Payer: Medicare HMO | Admitting: Neurology

## 2021-06-25 ENCOUNTER — Telehealth: Payer: Self-pay | Admitting: Neurology

## 2021-06-25 NOTE — Telephone Encounter (Signed)
Ms. Malson no showed her new patient appointment today for memory loss.  It does not appear as though she has no showed before so she may reschedule.  But if she does call please send her back to new patient referrals.  I would like new patient referrals to make sure that she understands that if her appointment is at 8 AM, lets say, she definitely has to be here 30 minutes in advance.  For memory patient's, as you know, the preappointment with Mini-Mental status exam or Montreal cognitive assessment test can take time and if patients show up right at their appointment time by I see her the appointment time is over and it puts me behind for every other patient.  So for the best care of memory patients, they have to come in half hour prior to their appointment time.  I would love to see Ms. Merryfield, I have seen her in the past she is lovely, and if she calls please reschedule but please emphasize that she has to come 30 minutes prior to her appointment time or she may be rescheduled.  ?

## 2021-07-24 ENCOUNTER — Other Ambulatory Visit: Payer: Self-pay | Admitting: Medical

## 2021-07-29 DIAGNOSIS — E1165 Type 2 diabetes mellitus with hyperglycemia: Secondary | ICD-10-CM | POA: Diagnosis not present

## 2021-07-29 DIAGNOSIS — E039 Hypothyroidism, unspecified: Secondary | ICD-10-CM | POA: Diagnosis not present

## 2021-07-29 DIAGNOSIS — E7800 Pure hypercholesterolemia, unspecified: Secondary | ICD-10-CM | POA: Diagnosis not present

## 2021-08-05 ENCOUNTER — Encounter: Payer: Self-pay | Admitting: Neurology

## 2021-08-05 ENCOUNTER — Ambulatory Visit (INDEPENDENT_AMBULATORY_CARE_PROVIDER_SITE_OTHER): Payer: HMO | Admitting: Neurology

## 2021-08-05 VITALS — BP 146/64 | HR 78 | Ht 62.0 in | Wt 190.0 lb

## 2021-08-05 DIAGNOSIS — G309 Alzheimer's disease, unspecified: Secondary | ICD-10-CM | POA: Diagnosis not present

## 2021-08-05 DIAGNOSIS — F4489 Other dissociative and conversion disorders: Secondary | ICD-10-CM

## 2021-08-05 DIAGNOSIS — R413 Other amnesia: Secondary | ICD-10-CM

## 2021-08-05 DIAGNOSIS — Z818 Family history of other mental and behavioral disorders: Secondary | ICD-10-CM | POA: Diagnosis not present

## 2021-08-05 MED ORDER — DONEPEZIL HCL 5 MG PO TABS: 5.0000 mg | ORAL_TABLET | Freq: Every day | ORAL | 6 refills | Status: DC

## 2021-08-05 NOTE — Patient Instructions (Addendum)
Repeat MRI brain - will call ?Formal extensive Neurocognitive testing with Alphonzo Severance, MD in [facility] - will call ?Start Donepezil today - watch for side effects ?If concern is for Alzheimers disease we may consider more testing ?EEG to evaluate brain waves ?We will follow up in 6 months, if testing is done sooner we will see you sooner ? ?Donepezil Tablets ?What is this medication? ?DONEPEZIL (doe NEP e zil) treats memory loss and confusion (dementia) in people who have Alzheimer disease. It works by improving attention, memory, and the ability to engage in daily activities. It is not a cure for dementia or Alzheimer disease. ?This medicine may be used for other purposes; ask your health care provider or pharmacist if you have questions. ?COMMON BRAND NAME(S): Aricept ?What should I tell my care team before I take this medication? ?They need to know if you have any of these conditions: ?Asthma or other lung disease ?Difficulty passing urine ?Head injury ?Heart disease ?History of irregular heartbeat ?Liver disease ?Seizures (convulsions) ?Stomach or intestinal disease, ulcers or stomach bleeding ?An unusual or allergic reaction to donepezil, other medications, foods, dyes, or preservatives ?Pregnant or trying to get pregnant ?Breast-feeding ?How should I use this medication? ?Take this medication by mouth with a glass of water. Follow the directions on the prescription label. You may take this medication with or without food. Take this medication at regular intervals. This medication is usually taken before bedtime. Do not take it more often than directed. Continue to take your medication even if you feel better. Do not stop taking except on your care team's advice. ?If you are taking the 23 mg donepezil tablet, swallow it whole; do not cut, crush, or chew it. ?Talk to your care team about the use of this medication in children. Special care may be needed. ?Overdosage: If you think you have taken too much of  this medicine contact a poison control center or emergency room at once. ?NOTE: This medicine is only for you. Do not share this medicine with others. ?What if I miss a dose? ?If you miss a dose, take it as soon as you can. If it is almost time for your next dose, take only that dose, do not take double or extra doses. ?What may interact with this medication? ?Do not take this medication with any of the following: ?Certain medications for fungal infections like itraconazole, fluconazole, posaconazole, and voriconazole ?Cisapride ?Dextromethorphan; quinidine ?Dronedarone ?Pimozide ?Quinidine ?Thioridazine ?This medication may also interact with the following: ?Antihistamines for allergy, cough and cold ?Atropine ?Bethanechol ?Carbamazepine ?Certain medications for bladder problems like oxybutynin, tolterodine ?Certain medications for Parkinson's disease like benztropine, trihexyphenidyl ?Certain medications for stomach problems like dicyclomine, hyoscyamine ?Certain medications for travel sickness like scopolamine ?Dexamethasone ?Dofetilide ?Ipratropium ?NSAIDs, medications for pain and inflammation, like ibuprofen or naproxen ?Other medications for Alzheimer's disease ?Other medications that prolong the QT interval (cause an abnormal heart rhythm) ?Phenobarbital ?Phenytoin ?Rifampin, rifabutin or rifapentine ?Ziprasidone ?This list may not describe all possible interactions. Give your health care provider a list of all the medicines, herbs, non-prescription drugs, or dietary supplements you use. Also tell them if you smoke, drink alcohol, or use illegal drugs. Some items may interact with your medicine. ?What should I watch for while using this medication? ?Visit your care team for regular checks on your progress. Check with your care team if your symptoms do not get better or if they get worse. ?You may get drowsy or dizzy. Do not drive, use machinery,  or do anything that needs mental alertness until you know how  this medication affects you. ?What side effects may I notice from receiving this medication? ?Side effects that you should report to your care team as soon as possible: ?Allergic reactions--skin rash, itching, hives, swelling of the face, lips, tongue, or throat ?Peptic ulcer--burning stomach pain, loss of appetite, bloating, burping, heartburn, nausea, vomiting ?Seizures ?Slow heartbeat--dizziness, feeling faint or lightheaded, confusion, trouble breathing, unusual weakness or fatigue ?Stomach bleeding--bloody or black, tar-like stools, vomiting blood or brown material that looks like coffee grounds ?Trouble passing urine ?Side effects that usually do not require medical attention (report these to your care team if they continue or are bothersome): ?Diarrhea ?Fatigue ?Loss of appetite ?Muscle pain or cramps ?Nausea ?Trouble sleeping ?This list may not describe all possible side effects. Call your doctor for medical advice about side effects. You may report side effects to FDA at 1-800-FDA-1088. ?Where should I keep my medication? ?Keep out of reach of children. ?Store at room temperature between 15 and 30 degrees C (59 and 86 degrees F). Throw away any unused medication after the expiration date. ?NOTE: This sheet is a summary. It may not cover all possible information. If you have questions about this medicine, talk to your doctor, pharmacist, or health care provider. ?? 2023 Elsevier/Gold Standard (2020-10-30 00:00:00) ? ?

## 2021-08-05 NOTE — Progress Notes (Signed)
?[facility] ? ? ? ?Provider:  Dr Carlos Levering ?Requesting Provider: Carlena Hurl, PA-C ?Primary Care Provider:  Carlena Hurl, PA-C ? ?CC:  memory loss ? ? HPI:  Shelly Blevins is a 76 y.o. female here as requested by Carlena Hurl, PA-C for memory. PMHx chronic gout, diabetic neuropathy, hypercholesterolemia, long-term use of insulin, decreased pulse(reviewed pulses over the last 2 years and have been normal), memory changes, hypothyroidism, coronary artery disease, memory changes. ? ?She says she has had slowly progressive memory problems for years. Losing keys, losing her pocket book,she can put something up in a cabinet and forget where she puts it at. She lives with her grandchildren, and great grand kids and she helps with caretaking and that can be stressful one has autism. No problems with driving or getting lost. She sometimes feel she repeats things, like her appointments her granddaughter helps her keep up with them. She keeps a calendar. "Sometimes" she may miss medications but her daughter reminds her. Patient puts her pills in a pill box but sometimes she makes mistakes. Daughter writes out the bills for her, sometimes she can write a check and feels anxious, she reports anxiety and depression. She has a lot of anxiety and depression; Stress at home with raising great grandchildren. She is sad some days. Her daughter died this week and that is making things worse. I recommend seeing psychiatry/therapy as guided by Chana Bode. Some days "I could dig a hole and get in". She has a sister who has been diagnosed with dementia, she is 53 years older and has forgotten her age unknown if Alzheimer's. She feels worse since she retired from [employer] a few years ago. She has episodes of confusion, possible altered mental status. No other focal neurologic deficits, associated symptoms, inciting events or modifiable factors. ? ? ?Reviewed notes, labs and imaging from outside  physicians, which showed: ? ?05/28/2021: RPR NR, 04/28/2021 HIV NR, Hep c neg, b12 1081 ? ?MRI brain 01/21/2017: reviewed images and agree:  ?The brain parenchyma shows minimum age-appropriate periventricular and subcortical white matter hyperintensities from chronic small vessel disease as well as mild degree of generalized cerebral atrophy. No structural lesion, tumor or infarcts are noted. Paranasal sinuses show mild chronic inflammatory changes.No abnormal lesions are seen on diffusion-weighted views to suggest acute ischemia. The cortical sulci, fissures and cisterns are normal in size and appearance. Lateral, third and fourth ventricle are normal in size and appearance. No extra-axial fluid collections are seen. No evidence of mass effect or midline shift.  No abnormal lesions are seen on post contrast views.  On sagittal views the posterior fossa, pituitary gland and corpus callosum are unremarkable. No evidence of intracranial hemorrhage on gradient-echo views. The orbits and their contents, paranasal sinuses and calvarium are unremarkable.  Intracranial flow voids are present. ?Thin sections through the cerebellopontine angles show normal appearance of the VIII nerve complexes without any abnormal enhancement or tumors noted ?  ?  ?  ?IMPRESSION:  Unremarkable MRI scan of the brain with and without contrast. ? ?Review of Systems: ?Patient complains of symptoms per HPI as well as the following symptoms depression. Pertinent negatives and positives per HPI. All others negative. ? ? ?Social History  ? ?Socioeconomic History  ? Marital status: Married  ?  Spouse name: Not on file  ? Number of children: 2  ? Years of education: 10  ? Highest education level: Not on file  ?Occupational History  ?  Employer: [employer]  ?Tobacco Use  ? Smoking status: Former  ?  Types: Cigarettes  ?  Quit date: 04/02/1986  ?  Years since quitting: 35.3  ? Smokeless tobacco: Never  ?Vaping Use  ? Vaping Use: Never used   ?Substance and Sexual Activity  ? Alcohol use: No  ? Drug use: No  ? Sexual activity: Not on file  ?Other Topics Concern  ? Not on file  ?Social History Narrative  ? Not on file  ? ?Social Determinants of Health  ? ?Financial Resource Strain: Low Risk   ? Difficulty of Paying Living Expenses: Not very hard  ?Food Insecurity: No Food Insecurity  ? Worried About Charity fundraiser in the Last Year: Never true  ? Ran Out of Food in the Last Year: Never true  ?Transportation Needs: No Transportation Needs  ? Lack of Transportation (Medical): No  ? Lack of Transportation (Non-Medical): No  ?Physical Activity: Not on file  ?Stress: Not on file  ?Social Connections: Not on file  ?Intimate Partner Violence: Not on file  ? ? ?Family History  ?Problem Relation Age of Onset  ? Heart failure Mother   ? Diabetes Mother   ? Dementia Sister   ? Heart failure Brother   ?     died at 55  ? Colon cancer Neg Hx   ? Esophageal cancer Neg Hx   ? Stomach cancer Neg Hx   ? Rectal cancer Neg Hx   ? ? ?Past Medical History:  ?Diagnosis Date  ? Allergy   ? Arthritis   ? CAD (coronary artery disease)   ? mild nonobstructive disease, 30% Cfx lesion  ? Cataract   ? Diabetes mellitus   ? insulin-dependent; Dr. Chalmers Cater  ? Dyslipidemia   ? GERD (gastroesophageal reflux disease)   ? Hyperlipidemia   ? Hypertension   ? Hypothyroidism   ? Dr. Chalmers Cater  ? Stroke [facility])   ? ? ?Patient Active Problem List  ? Diagnosis Date Noted  ? Elevated uric acid in blood 05/12/2021  ? Chronic gout without tophus 05/12/2021  ? High risk medication use 05/12/2021  ? Anemia 01/23/2021  ? Diabetic neuropathy ([place]) 01/15/2021  ? Hypercholesterolemia 01/15/2021  ? Hyperglycemia due to type 2 diabetes mellitus ([place]) 01/15/2021  ? Long term (current) use of insulin ([place]) 01/15/2021  ? Diabetes mellitus ([place]) 12/31/2020  ? Hypertrophy of nail 12/31/2020  ? Pain in joints of both feet 12/31/2020  ? Abnormal ankle brachial index (ABI) 12/31/2020  ? Decreased pulse 12/31/2020  ?  Encounter for health maintenance examination in adult 04/25/2020  ? Medicare annual wellness visit, subsequent 04/25/2020  ? Memory change 04/25/2020  ? Post-menopausal 04/25/2020  ? Constipation 02/29/2020  ? Osteoarthritis of both knees 01/10/2020  ? Chronic pain of both knees 03/28/2019  ? Vitamin D deficiency 03/28/2019  ? Ataxia 11/02/2016  ? Estrogen deficiency 11/02/2016  ? Controlled type 2 diabetes mellitus without complication, with long-term current use of insulin ([place]) 11/02/2016  ? Vaccine counseling 10/09/2015  ? Advanced directives, counseling/discussion 10/09/2015  ? Need for prophylactic vaccination against Streptococcus pneumoniae (pneumococcus) 10/09/2015  ? Hypothyroidism 04/17/2015  ? Essential hypertension 06/06/2013  ? Dyslipidemia 06/06/2013  ? Coronary artery disease due to lipid rich plaque 06/06/2013  ? GERD 10/29/2008  ? ? ?Past Surgical History:  ?Procedure Laterality Date  ? ABDOMINAL HYSTERECTOMY    ? CARDIAC CATHETERIZATION  02/17/2007  ? mild CAD with 50% prox L Cfx stenosis, normal LV function (Dr. Lemmie Evens.  Elisabeth Cara)  ? CHOLECYSTECTOMY    ? EYE SURGERY    ? NM MYOCAR PERF WALL MOTION  2009  ? lexiscan myoview - perfusion defect in anterior myociaral region (breast attenuation) - low risk scan - EF 69%  ? THYROIDECTOMY    ? TRANSTHORACIC ECHOCARDIOGRAM  2008  ? normal study   ? ? ?Current Outpatient Medications  ?Medication Sig Dispense Refill  ? albuterol (VENTOLIN HFA) 108 (90 Base) MCG/ACT inhaler INHALE 2 PUFFS INTO THE LUNGS EVERY 6 (SIX) HOURS AS NEEDED FOR WHEEZING OR SHORTNESS OF BREATH. 3 each 0  ? allopurinol (ZYLOPRIM) 100 MG tablet Take 100 mg by mouth daily.    ? amLODipine (NORVASC) 10 MG tablet TAKE 1 TABLET BY MOUTH EVERY DAY 90 tablet 1  ? aspirin EC 81 MG tablet Take 1 tablet (81 mg total) by mouth daily. 90 tablet 3  ? atorvastatin (LIPITOR) 80 MG tablet Take 1 tablet (80 mg total) by mouth daily. 90 tablet 3  ? BD PEN NEEDLE NANO 2ND GEN 32G X 4 MM MISC 2 (two) times  daily. as directed    ? cholecalciferol (VITAMIN D3) 25 MCG (1000 UNIT) tablet Take 1 tablet (1,000 Units total) by mouth daily. 90 tablet 3  ? donepezil (ARICEPT) 5 MG tablet Take 1 tablet (5 mg total) by mouth at bedti

## 2021-08-06 ENCOUNTER — Telehealth: Payer: Self-pay | Admitting: Neurology

## 2021-08-06 NOTE — Telephone Encounter (Signed)
Referral for Neuropsychology sent to Dr. Alphonzo Severance [phone]. ?

## 2021-08-06 NOTE — Telephone Encounter (Signed)
HTA order sent to GI, [facility] they will reach out to the patient to schedule.  

## 2021-08-11 ENCOUNTER — Telehealth: Payer: Self-pay | Admitting: Medical

## 2021-08-11 MED ORDER — RAMIPRIL 10 MG PO CAPS: 10.0000 mg | ORAL_CAPSULE | Freq: Every day | ORAL | 1 refills | Status: DC

## 2021-08-11 NOTE — Telephone Encounter (Signed)
Sent in med

## 2021-08-11 NOTE — Telephone Encounter (Signed)
Pt granddaughter called and is requesting that pt rx ramipril be changed to the [id number] [place], [place] [address]?They never got it at the [facility], in march they never updated her insurance information  ?

## 2021-08-14 ENCOUNTER — Ambulatory Visit (INDEPENDENT_AMBULATORY_CARE_PROVIDER_SITE_OTHER): Payer: HMO | Admitting: Neurology

## 2021-08-14 DIAGNOSIS — R413 Other amnesia: Secondary | ICD-10-CM

## 2021-08-14 DIAGNOSIS — R569 Unspecified convulsions: Secondary | ICD-10-CM

## 2021-08-14 NOTE — Procedures (Signed)
    History:  76 year old woman with memory loss  EEG classification: Awake and drowsy  Description of the recording: The background rhythms of this recording consists of a fairly well modulated medium amplitude alpha rhythm of 9 Hz that is reactive to eye opening and closure. As the record progresses, the patient appears to remain in the waking state throughout the recording. Photic stimulation was performed, did not show any abnormalities. Hyperventilation was also performed, did not show any abnormalities. Toward the end of the recording, the patient enters the drowsy state with slight symmetric slowing seen. The patient never enters stage II sleep. No abnormal epileptiform discharges seen during this recording. There was no focal slowing. EKG monitor shows no evidence of cardiac rhythm abnormalities with a heart rate of 66.  Abnormality: None   Impression: This is a normal EEG recording in the waking and drowsy state. No evidence of interictal epileptiform discharges seen. A normal EEG does not exclude a diagnosis of epilepsy.    Alric Ran, MD [facility]

## 2021-08-19 ENCOUNTER — Other Ambulatory Visit: Payer: Self-pay

## 2021-08-19 ENCOUNTER — Encounter: Payer: Self-pay | Admitting: *Deleted

## 2021-08-19 ENCOUNTER — Telehealth: Payer: Self-pay | Admitting: *Deleted

## 2021-08-19 NOTE — Telephone Encounter (Signed)
Spoke with patient and granddaughter(checked DPR) Gave EEG results\. Pt  expressed understanding.Pt states she is having some memory issues. Pt states her PCP is aware and is following up with her with memory loss. Pt thanked me for calling

## 2021-08-19 NOTE — Telephone Encounter (Signed)
error 

## 2021-08-19 NOTE — Telephone Encounter (Signed)
LVM  for patient to call me back for test results

## 2021-08-19 NOTE — Telephone Encounter (Signed)
-----   Message from Melvenia Beam, MD sent at 08/18/2021  6:06 PM EDT ----- EEG is normal, no suggestion of seizures or any epileptiform activity.  Thank you

## 2021-08-26 DIAGNOSIS — H401131 Primary open-angle glaucoma, bilateral, mild stage: Secondary | ICD-10-CM | POA: Diagnosis not present

## 2021-08-28 DIAGNOSIS — E039 Hypothyroidism, unspecified: Secondary | ICD-10-CM | POA: Diagnosis not present

## 2021-08-28 DIAGNOSIS — M1990 Unspecified osteoarthritis, unspecified site: Secondary | ICD-10-CM | POA: Diagnosis not present

## 2021-08-28 DIAGNOSIS — E1140 Type 2 diabetes mellitus with diabetic neuropathy, unspecified: Secondary | ICD-10-CM | POA: Diagnosis not present

## 2021-08-28 DIAGNOSIS — E1165 Type 2 diabetes mellitus with hyperglycemia: Secondary | ICD-10-CM | POA: Diagnosis not present

## 2021-08-28 DIAGNOSIS — I10 Essential (primary) hypertension: Secondary | ICD-10-CM | POA: Diagnosis not present

## 2021-09-04 DIAGNOSIS — S0502XA Injury of conjunctiva and corneal abrasion without foreign body, left eye, initial encounter: Secondary | ICD-10-CM | POA: Diagnosis not present

## 2021-09-08 DIAGNOSIS — S0502XA Injury of conjunctiva and corneal abrasion without foreign body, left eye, initial encounter: Secondary | ICD-10-CM | POA: Diagnosis not present

## 2021-09-16 ENCOUNTER — Telehealth: Payer: Self-pay | Admitting: Cardiovascular Disease

## 2021-09-16 MED ORDER — EZETIMIBE 10 MG PO TABS: 10.0000 mg | ORAL_TABLET | Freq: Every day | ORAL | 3 refills | Status: DC

## 2021-09-16 NOTE — Telephone Encounter (Signed)
*  STAT* If patient is at the pharmacy, call can be transferred to refill team.   1. Which medications need to be refilled? (please list name of each medication and dose if known) need a new  prescription for Ezetimibe  2. Which pharmacy/location (including street and city if local pharmacy) is medication to be sent to? [facility] [place] [place]  3. Do they need a 30 day or 90 day supply? 90 days and refills- she is completely out of it

## 2021-11-04 ENCOUNTER — Telehealth: Payer: Self-pay | Admitting: Medical

## 2021-11-04 NOTE — Telephone Encounter (Signed)
Pt called in and says her feet, ankles, shoulders are swollen and she is wondering if it is due to any medications she is on. I tried to get her to schedule an appointment but she says her ankles are pretty swollen but if she needs to be seen I will call her back and have her schedule and appt.

## 2021-11-06 ENCOUNTER — Ambulatory Visit (INDEPENDENT_AMBULATORY_CARE_PROVIDER_SITE_OTHER): Payer: HMO | Admitting: Family Medicine

## 2021-11-06 ENCOUNTER — Encounter: Payer: Self-pay | Admitting: Family Medicine

## 2021-11-06 VITALS — BP 150/68 | HR 74 | Temp 97.9°F | Wt 193.0 lb

## 2021-11-06 DIAGNOSIS — R609 Edema, unspecified: Secondary | ICD-10-CM

## 2021-11-06 DIAGNOSIS — R0609 Other forms of dyspnea: Secondary | ICD-10-CM

## 2021-11-06 NOTE — Patient Instructions (Signed)
Cut the amlodipine in half and take one half a pill for now.

## 2021-11-06 NOTE — Progress Notes (Signed)
   Subjective:    Patient ID: Shelly Blevins, female    DOB: 04-07-1945, 76 y.o.   MRN: [id number]  HPI She complains of a 1 week history of difficulty with bilateral leg swelling.  She states that the swelling does not go away in the morning.  She does complain of some dyspnea on exertion but is very vague as to when this occurs and how long it lasts.  States it can last as much is 10 minutes.  She does not have chest pain, shortness of breath or PND.  She presently is taking amlodipine 10 mg and has been on this for a period of time.  She has also gained some weight over the last several months.   Review of Systems     Objective:   Physical Exam Alert and in no distress.  Cardiac exam shows regular rhythm without murmurs or gallops.  Lungs are clear to auscultation.  Lower extremity edema shows 2+ pitting edema bilaterally halfway up her calf.  EKG done today shows a rate of 73 with poor R wave progression but similar to previous tracing.       Assessment & Plan:  Peripheral edema - Plan: CBC with Differential/Platelet, Comprehensive metabolic panel  Dyspnea on exertion - Plan: Brain natriuretic peptide, CBC with Differential/Platelet, Comprehensive metabolic panel I will have her reduce her amlodipine to 5 mg.  Further evaluation and treatment based on blood work.

## 2021-11-07 LAB — CBC WITH DIFFERENTIAL/PLATELET
Basophils Absolute: 0 10*3/uL (ref 0.0–0.2)
Basos: 1 %
EOS (ABSOLUTE): 0.2 10*3/uL (ref 0.0–0.4)
Eos: 3 %
Hematocrit: 31 % — ABNORMAL LOW (ref 34.0–46.6)
Hemoglobin: 10.5 g/dL — ABNORMAL LOW (ref 11.1–15.9)
Immature Grans (Abs): 0 10*3/uL (ref 0.0–0.1)
Immature Granulocytes: 0 %
Lymphocytes Absolute: 2.8 10*3/uL (ref 0.7–3.1)
Lymphs: 34 %
MCH: 29.7 pg (ref 26.6–33.0)
MCHC: 33.9 g/dL (ref 31.5–35.7)
MCV: 88 fL (ref 79–97)
Monocytes Absolute: 0.9 10*3/uL (ref 0.1–0.9)
Monocytes: 10 %
Neutrophils Absolute: 4.3 10*3/uL (ref 1.4–7.0)
Neutrophils: 52 %
Platelets: 247 10*3/uL (ref 150–450)
RBC: 3.53 x10E6/uL — ABNORMAL LOW (ref 3.77–5.28)
RDW: 13.9 % (ref 11.7–15.4)
WBC: 8.2 10*3/uL (ref 3.4–10.8)

## 2021-11-07 LAB — COMPREHENSIVE METABOLIC PANEL
ALT: 27 IU/L (ref 0–32)
AST: 27 IU/L (ref 0–40)
Albumin/Globulin Ratio: 1.6 (ref 1.2–2.2)
Albumin: 4.7 g/dL (ref 3.8–4.8)
Alkaline Phosphatase: 68 IU/L (ref 44–121)
BUN/Creatinine Ratio: 18 (ref 12–28)
BUN: 18 mg/dL (ref 8–27)
Bilirubin Total: 0.3 mg/dL (ref 0.0–1.2)
CO2: 23 mmol/L (ref 20–29)
Calcium: 9.7 mg/dL (ref 8.7–10.3)
Chloride: 99 mmol/L (ref 96–106)
Creatinine, Ser: 1.01 mg/dL — ABNORMAL HIGH (ref 0.57–1.00)
Globulin, Total: 3 g/dL (ref 1.5–4.5)
Glucose: 201 mg/dL — ABNORMAL HIGH (ref 70–99)
Potassium: 4.7 mmol/L (ref 3.5–5.2)
Sodium: 137 mmol/L (ref 134–144)
Total Protein: 7.7 g/dL (ref 6.0–8.5)
eGFR: 57 mL/min/{1.73_m2} — ABNORMAL LOW (ref >59)

## 2021-11-07 LAB — BRAIN NATRIURETIC PEPTIDE: BNP: 31.5 pg/mL (ref 0.0–100.0)

## 2021-11-07 NOTE — Addendum Note (Signed)
Addended by: Denita Lung on: 11/07/2021 02:38 PM   Modules accepted: Orders

## 2021-11-07 NOTE — Progress Notes (Signed)
Lvm for pt to call back for labs . KH

## 2021-11-17 ENCOUNTER — Ambulatory Visit: Payer: HMO | Admitting: Family Medicine

## 2021-11-18 ENCOUNTER — Ambulatory Visit (INDEPENDENT_AMBULATORY_CARE_PROVIDER_SITE_OTHER): Payer: HMO | Admitting: Family Medicine

## 2021-11-18 VITALS — BP 130/62 | HR 78 | Temp 96.8°F | Wt 186.8 lb

## 2021-11-18 DIAGNOSIS — R609 Edema, unspecified: Secondary | ICD-10-CM | POA: Diagnosis not present

## 2021-11-18 DIAGNOSIS — I10 Essential (primary) hypertension: Secondary | ICD-10-CM

## 2021-11-18 NOTE — Progress Notes (Signed)
   Subjective:    Patient ID: Shelly Blevins, female    DOB: [birth year], 76 y.o.   MRN: [id number]  HPI She is here for a recheck.  Since last being seen she did cut her amlodipine down to 5 mg.  She states that the swelling went away and she is lost several pounds.   Review of Systems     Objective:   Physical Exam Alert and in no distress.       Assessment & Plan:  Peripheral edema  Essential hypertension I think her edema is definitely related to amlodipine since she has lost approximately 6 pounds and her blood pressure remains normal.  She will continue on the 5 mg dosing.  Also recommend she get Tdap and the second Shingrix shot.

## 2021-11-19 ENCOUNTER — Other Ambulatory Visit: Payer: Self-pay | Admitting: Medical

## 2021-11-19 DIAGNOSIS — Z1231 Encounter for screening mammogram for malignant neoplasm of breast: Secondary | ICD-10-CM

## 2021-12-08 ENCOUNTER — Telehealth (INDEPENDENT_AMBULATORY_CARE_PROVIDER_SITE_OTHER): Payer: HMO | Admitting: Family Medicine

## 2021-12-08 VITALS — BP 130/62 | Wt 186.0 lb

## 2021-12-08 DIAGNOSIS — K529 Noninfective gastroenteritis and colitis, unspecified: Secondary | ICD-10-CM | POA: Diagnosis not present

## 2021-12-08 MED ORDER — ONDANSETRON HCL 4 MG PO TABS: 4.0000 mg | ORAL_TABLET | Freq: Three times a day (TID) | ORAL | 0 refills | Status: DC | PRN

## 2021-12-08 NOTE — Progress Notes (Signed)
   Subjective:    Patient ID: Shelly Blevins, female    DOB: Jul 01, 1945, 76 y.o.   MRN: [id number]  HPI Documentation for virtual audio and video telecommunications through [place] encounter: The patient was located at home. 2 patient identifiers used.  The provider was located in the office. The patient did consent to this visit and is aware of possible charges through their insurance for this visit. The other persons participating in this telemedicine service were none. Time spent on call was 5 minutes and in review of previous records >15 minutes total for counseling and coordination of care. This virtual service is not related to other E/M service within previous 7 days.  She states that last week she did have a bout of vomiting and diarrhea but it got better on its own but then last night this reoccurred with vomiting, diarrhea and nausea but no fever or chills.  No other family members have had difficulty with this.  Stool is loose but not foul-smelling.  Review of Systems     Objective:   Physical Exam Alert and in no distress otherwise not examined       Assessment & Plan:  Acute gastroenteritis - Plan: ondansetron (ZOFRAN) 4 MG tablet Instructed on proper use of Zofran including doubling the dose if no benefit with the 1 dose.  She is to slowly increase her fluid intake every 20 minutes and can eat anything she wants.  Also recommend Imodium to help with diarrhea.  Explained that it can take a week sometimes for this to go away.

## 2021-12-10 ENCOUNTER — Other Ambulatory Visit: Payer: Self-pay | Admitting: Medical

## 2021-12-29 ENCOUNTER — Ambulatory Visit
Admission: RE | Admit: 2021-12-29 | Discharge: 2021-12-29 | Disposition: A | Discharge status: Home / Self Care | Payer: HMO | Source: Ambulatory Visit | Attending: Medical | Admitting: Medical

## 2021-12-29 DIAGNOSIS — Z1231 Encounter for screening mammogram for malignant neoplasm of breast: Secondary | ICD-10-CM | POA: Diagnosis not present

## 2021-12-30 DIAGNOSIS — E7800 Pure hypercholesterolemia, unspecified: Secondary | ICD-10-CM | POA: Diagnosis not present

## 2021-12-30 DIAGNOSIS — I10 Essential (primary) hypertension: Secondary | ICD-10-CM | POA: Diagnosis not present

## 2021-12-30 DIAGNOSIS — E1140 Type 2 diabetes mellitus with diabetic neuropathy, unspecified: Secondary | ICD-10-CM | POA: Diagnosis not present

## 2021-12-30 DIAGNOSIS — E1165 Type 2 diabetes mellitus with hyperglycemia: Secondary | ICD-10-CM | POA: Diagnosis not present

## 2021-12-30 DIAGNOSIS — E039 Hypothyroidism, unspecified: Secondary | ICD-10-CM | POA: Diagnosis not present

## 2022-01-06 ENCOUNTER — Encounter: Payer: Self-pay | Admitting: Internal Medicine

## 2022-01-29 ENCOUNTER — Other Ambulatory Visit: Payer: Self-pay | Admitting: Neurology

## 2022-01-29 ENCOUNTER — Other Ambulatory Visit: Payer: Self-pay | Admitting: Medical

## 2022-01-29 NOTE — Telephone Encounter (Signed)
Has an appt in January 2024

## 2022-02-07 ENCOUNTER — Other Ambulatory Visit: Payer: Self-pay | Admitting: Medical

## 2022-02-23 ENCOUNTER — Encounter: Payer: Self-pay | Admitting: Adult Health

## 2022-02-23 ENCOUNTER — Ambulatory Visit (INDEPENDENT_AMBULATORY_CARE_PROVIDER_SITE_OTHER): Payer: HMO | Admitting: Adult Health

## 2022-02-23 VITALS — BP 152/68 | HR 72 | Ht 62.0 in | Wt 190.8 lb

## 2022-02-23 DIAGNOSIS — R413 Other amnesia: Secondary | ICD-10-CM

## 2022-02-23 MED ORDER — DONEPEZIL HCL 10 MG PO TABS: 10.0000 mg | ORAL_TABLET | Freq: Every day | ORAL | 11 refills | Status: DC

## 2022-02-23 NOTE — Telephone Encounter (Signed)
Resent referral to Dr. Otto Herb office (pt was never scheduled), phone # [phone].

## 2022-02-23 NOTE — Progress Notes (Signed)
PATIENT: Shelly Blevins DOB: 09-17-1945  REASON FOR VISIT: follow up HISTORY FROM: patient PRIMARY NEUROLOGIST: Dr. Jaynee Eagles  Chief Complaint  Patient presents with   Follow-up    [name] with granddaughter Pt and granddaughter states short term is better since last visit       HISTORY OF PRESENT ILLNESS: Today 02/23/22  Shelly Blevins is a 76 y.o. female who has been followed in this office for memory disturbance. Returns today for follow-up. Feels that memory is better. Lives with her grandson and sister. Able to complete all ADLs independently. Needs help with appointments. Manages her own medications but notices that she may forget. Granddaughter helps remind her about medication. Sleeps well. Reports that she may get agitated easy.  Currently taking Aricept 5 mg at bedtime and tolerating it well.   HISTORY [name] is a 76 y.o. female here as requested by Carlena Hurl, PA-C for memory. PMHx chronic gout, diabetic neuropathy, hypercholesterolemia, long-term use of insulin, decreased pulse(reviewed pulses over the last 2 years and have been normal), memory changes, hypothyroidism, coronary artery disease, memory changes.   She says she has had slowly progressive memory problems for years. Losing keys, losing her pocket book,she can put something up in a cabinet and forget where she puts it at. She lives with her grandchildren, and great grand kids and she helps with caretaking and that can be stressful one has autism. No problems with driving or getting lost. She sometimes feel she repeats things, like her appointments her granddaughter helps her keep up with them. She keeps a calendar. "Sometimes" she may miss medications but her daughter reminds her. Patient puts her pills in a pill box but sometimes she makes mistakes. Daughter writes out the bills for her, sometimes she can write a check and feels anxious, she reports anxiety and depression. She has a lot of anxiety and  depression; Stress at home with raising great grandchildren. She is sad some days. Her daughter died this week and that is making things worse. I recommend seeing psychiatry/therapy as guided by Chana Bode. Some days "I could dig a hole and get in". She has a sister who has been diagnosed with dementia, she is 55 years older and has forgotten her age unknown if Alzheimer's. She feels worse since she retired from [employer] a few years ago. She has episodes of confusion, possible altered mental status. No other focal neurologic deficits, associated symptoms, inciting events or modifiable factors.     Reviewed notes, labs and imaging from outside physicians, which showed:   05/28/2021: RPR NR, 04/28/2021 HIV NR, Hep c neg, b12 1081   MRI brain 01/21/2017: reviewed images and agree:  The brain parenchyma shows minimum age-appropriate periventricular and subcortical white matter hyperintensities from chronic small vessel disease as well as mild degree of generalized cerebral atrophy. No structural lesion, tumor or infarcts are noted. Paranasal sinuses show mild chronic inflammatory changes.No abnormal lesions are seen on diffusion-weighted views to suggest acute ischemia. The cortical sulci, fissures and cisterns are normal in size and appearance. Lateral, third and fourth ventricle are normal in size and appearance. No extra-axial fluid collections are seen. No evidence of mass effect or midline shift.  No abnormal lesions are seen on post contrast views.  On sagittal views the posterior fossa, pituitary gland and corpus callosum are unremarkable. No evidence of intracranial hemorrhage on gradient-echo views. The orbits and their contents, paranasal sinuses and calvarium are unremarkable.  Intracranial  flow voids are present. Thin sections through the cerebellopontine angles show normal appearance of the VIII nerve complexes without any abnormal enhancement or tumors noted  REVIEW OF SYSTEMS: Out of a  complete 14 system review of symptoms, the patient complains only of the following symptoms, and all other reviewed systems are negative.  ALLERGIES: No Known Allergies  HOME MEDICATIONS: Outpatient Medications Prior to Visit  Medication Sig Dispense Refill   albuterol (VENTOLIN HFA) 108 (90 Base) MCG/ACT inhaler INHALE 2 PUFFS INTO THE LUNGS EVERY 6 (SIX) HOURS AS NEEDED FOR WHEEZING OR SHORTNESS OF BREATH. 3 each 0   allopurinol (ZYLOPRIM) 100 MG tablet Take 100 mg by mouth daily.     amLODipine (NORVASC) 10 MG tablet TAKE 1 TABLET BY MOUTH EVERY DAY 90 tablet 0   aspirin EC 81 MG tablet Take 1 tablet (81 mg total) by mouth daily. 90 tablet 3   atorvastatin (LIPITOR) 80 MG tablet Take 1 tablet (80 mg total) by mouth daily. 90 tablet 3   BD PEN NEEDLE NANO 2ND GEN 32G X 4 MM MISC 2 (two) times daily. as directed     cholecalciferol (VITAMIN D3) 25 MCG (1000 UNIT) tablet Take 1 tablet (1,000 Units total) by mouth daily. 90 tablet 3   donepezil (ARICEPT) 5 MG tablet TAKE 1 TABLET (5 MG TOTAL) BY MOUTH AT BEDTIME. HELPS SLOW DOWN MEMORY LOSS. 90 tablet 0   ezetimibe (ZETIA) 10 MG tablet Take 1 tablet (10 mg total) by mouth daily. 90 tablet 3   Glucagon (GVOKE HYPOPEN 2-PACK) 1 MG/0.2ML SOAJ Inject 1 each into the skin as needed. 0.2 mL 1   latanoprost (XALATAN) 0.005 % ophthalmic solution Place 1 drop into both eyes at bedtime.  5   levothyroxine (SYNTHROID, LEVOTHROID) 50 MCG tablet Take 50 mcg by mouth daily before breakfast.     metFORMIN (GLUCOPHAGE-XR) 500 MG 24 hr tablet Take 2 tablets by mouth 2 (two) times daily.     Multiple Vitamins-Minerals (EMERGEN-C IMMUNE PLUS) PACK Take 1 tablet by mouth 2 (two) times daily. 10 each 0   NOVOLOG MIX 70/30 FLEXPEN (70-30) 100 UNIT/ML FlexPen Inject 60 Units into the skin 2 (two) times daily.   3   omeprazole (PRILOSEC) 40 MG capsule Take 40 mg by mouth every morning.     ondansetron (ZOFRAN) 4 MG tablet Take 1 tablet (4 mg total) by mouth every 8  (eight) hours as needed for nausea or vomiting. 20 tablet 0   ONETOUCH VERIO test strip 3 (three) times daily.     ramipril (ALTACE) 10 MG capsule TAKE 1 CAPSULE BY MOUTH EVERY DAY 90 capsule 0   spironolactone (ALDACTONE) 25 MG tablet TAKE 1/2 TABLET BY MOUTH EVERY DAY 45 tablet 2   Vitamin D, Ergocalciferol, (DRISDOL) 1.25 MG (50000 UNIT) CAPS capsule Take by mouth.     predniSONE (DELTASONE) 5 MG tablet Take 5 mg by mouth daily.     No facility-administered medications prior to visit.    PAST MEDICAL HISTORY: Past Medical History:  Diagnosis Date   Allergy    Arthritis    CAD (coronary artery disease)    mild nonobstructive disease, 30% Cfx lesion   Cataract    Diabetes mellitus    insulin-dependent; Dr. Chalmers Cater   Dyslipidemia    GERD (gastroesophageal reflux disease)    Hyperlipidemia    Hypertension    Hypothyroidism    Dr. Chalmers Cater   Stroke [facility])     PAST SURGICAL HISTORY: Past Surgical History:  Procedure Laterality Date   ABDOMINAL HYSTERECTOMY     CARDIAC CATHETERIZATION  02/17/2007   mild CAD with 50% prox L Cfx stenosis, normal LV function (Dr. Norlene Duel)   Winkler WALL MOTION  2009   lexiscan myoview - perfusion defect in anterior myociaral region (breast attenuation) - low risk scan - EF 69%   THYROIDECTOMY     TRANSTHORACIC ECHOCARDIOGRAM  2008   normal study     FAMILY HISTORY: Family History  Problem Relation Age of Onset   Heart failure Mother    Diabetes Mother    Dementia Sister    Heart failure Brother        died at 37   Colon cancer Neg Hx    Esophageal cancer Neg Hx    Stomach cancer Neg Hx    Rectal cancer Neg Hx    Alzheimer's disease Neg Hx     SOCIAL HISTORY: Social History   Socioeconomic History   Marital status: Married    Spouse name: Not on file   Number of children: 2   Years of education: 10   Highest education level: Not on file  Occupational History    Employer: [employer]  Tobacco Use   Smoking status: Former    Types: Cigarettes    Quit date: 04/02/1986    Years since quitting: 35.9   Smokeless tobacco: Never  Vaping Use   Vaping Use: Never used  Substance and Sexual Activity   Alcohol use: No   Drug use: No   Sexual activity: Not on file  Other Topics Concern   Not on file  Social History Narrative   Not on file   Social Determinants of Health   Financial Resource Strain: Low Risk  (09/19/2020)   Overall Financial Resource Strain (CARDIA)    Difficulty of Paying Living Expenses: Not very hard  Food Insecurity: No Food Insecurity (09/19/2020)   Hunger Vital Sign    Worried About Running Out of Food in the Last Year: Never true    Ran Out of Food in the Last Year: Never true  Transportation Needs: No Transportation Needs (09/19/2020)   PRAPARE - Hydrologist (Medical): No    Lack of Transportation (Non-Medical): No  Physical Activity: Not on file  Stress: Not on file  Social Connections: Not on file  Intimate Partner Violence: Not on file      PHYSICAL EXAM  Vitals:   02/23/22 0927  BP: (!) 152/68  Pulse: 72  Weight: 190 lb 12.8 oz (86.5 kg)  Height: '5\' 2"'$  (1.575 m)   Body mass index is 34.9 kg/m.     02/23/2022    9:30 AM 08/05/2021   10:21 AM  MMSE - Mini Mental State Exam  Orientation to time 2 4  Orientation to Place 5 5  Registration 3 3  Attention/ Calculation 5 1  Recall 2 2  Language- name 2 objects 2 2  Language- repeat 0 0  Language- follow 3 step command 3 2  Language- read & follow direction 0 0  Write a sentence 1 0  Copy design 0 0  Total score 23 19     Generalized: Well developed, in no acute distress   Neurological examination  Mentation: Alert oriented to time, place, history taking. Follows all commands speech and language fluent Cranial nerve II-XII: Pupils were equal round reactive to light.  Extraocular movements were full, visual field were full on  confrontational test. Facial sensation and strength were normal. Head turning and shoulder shrug  were normal and symmetric. Motor: The motor testing reveals 5 over 5 strength of all 4 extremities. Good symmetric motor tone is noted throughout.  Sensory: Sensory testing is intact to soft touch on all 4 extremities. No evidence of extinction is noted.  Coordination: Cerebellar testing reveals good finger-nose-finger and heel-to-shin bilaterally.  Gait and station: Gait is normal.    DIAGNOSTIC DATA (LABS, IMAGING, TESTING) - I reviewed patient records, labs, notes, testing and imaging myself where available.  Lab Results  Component Value Date   WBC 8.2 11/06/2021   HGB 10.5 (L) 11/06/2021   HCT 31.0 (L) 11/06/2021   MCV 88 11/06/2021   PLT 247 11/06/2021      Component Value Date/Time   NA 137 11/06/2021 1604   K 4.7 11/06/2021 1604   CL 99 11/06/2021 1604   CO2 23 11/06/2021 1604   GLUCOSE 201 (H) 11/06/2021 1604   GLUCOSE 141 (H) 11/02/2016 0934   BUN 18 11/06/2021 1604   CREATININE 1.01 (H) 11/06/2021 1604   CREATININE 0.90 11/02/2016 0934   CALCIUM 9.7 11/06/2021 1604   PROT 7.7 11/06/2021 1604   ALBUMIN 4.7 11/06/2021 1604   AST 27 11/06/2021 1604   ALT 27 11/06/2021 1604   ALKPHOS 68 11/06/2021 1604   BILITOT 0.3 11/06/2021 1604   GFRNONAA 58 (L) 04/25/2020 1151   GFRAA 66 04/25/2020 1151   Lab Results  Component Value Date   CHOL 129 04/28/2021   HDL 52 04/28/2021   LDLCALC 59 04/28/2021   TRIG 93 04/28/2021   CHOLHDL 2.5 04/28/2021   Lab Results  Component Value Date   HGBA1C 7.5 (H) 04/25/2020   Lab Results  Component Value Date   VITAMINB12 1,081 04/28/2021   Lab Results  Component Value Date   TSH 1.570 11/25/2020      ASSESSMENT AND PLAN 76 y.o. year old female  has a past medical history of Allergy, Arthritis, CAD (coronary artery disease), Cataract, Diabetes mellitus, Dyslipidemia, GERD (gastroesophageal reflux disease), Hyperlipidemia,  Hypertension, Hypothyroidism, and Stroke ([place]). here with :  1.  Memory disturbance  -Increase Aricept to 10 mg at bedtime -MMSE 23/30 previously 19/30 -Continue to monitor symptoms - FU in 6 months or sooner if needed    Ward Givens, MSN, NP-C 02/23/2022, 10:05 AM [facility] [address] [place], [place] [address] [phone]

## 2022-02-23 NOTE — Patient Instructions (Signed)
Increase Aricept to 10 mg at bedtime If your symptoms worsen or you develop new symptoms please let us know.

## 2022-03-06 ENCOUNTER — Encounter: Payer: Self-pay | Admitting: Medical

## 2022-03-06 DIAGNOSIS — H353111 Nonexudative age-related macular degeneration, right eye, early dry stage: Secondary | ICD-10-CM | POA: Diagnosis not present

## 2022-03-06 DIAGNOSIS — H401131 Primary open-angle glaucoma, bilateral, mild stage: Secondary | ICD-10-CM | POA: Diagnosis not present

## 2022-03-06 DIAGNOSIS — E119 Type 2 diabetes mellitus without complications: Secondary | ICD-10-CM | POA: Diagnosis not present

## 2022-03-06 DIAGNOSIS — H524 Presbyopia: Secondary | ICD-10-CM | POA: Diagnosis not present

## 2022-03-06 DIAGNOSIS — H04123 Dry eye syndrome of bilateral lacrimal glands: Secondary | ICD-10-CM | POA: Diagnosis not present

## 2022-03-06 LAB — HM DIABETES EYE EXAM

## 2022-03-18 ENCOUNTER — Telehealth: Payer: Self-pay | Admitting: Internal Medicine

## 2022-03-18 MED ORDER — RAMIPRIL 10 MG PO CAPS: 10.0000 mg | ORAL_CAPSULE | Freq: Every day | ORAL | 0 refills | Status: DC

## 2022-03-18 MED ORDER — SPIRONOLACTONE 25 MG PO TABS: 12.5000 mg | ORAL_TABLET | Freq: Every day | ORAL | 0 refills | Status: DC

## 2022-03-18 NOTE — Telephone Encounter (Signed)
Refill request for ramipril '10mg'$  and spironolactone '25mg'$  to [facility]

## 2022-03-24 ENCOUNTER — Encounter: Payer: Self-pay | Admitting: Medical

## 2022-03-25 ENCOUNTER — Telehealth: Payer: Self-pay | Admitting: Internal Medicine

## 2022-03-25 ENCOUNTER — Other Ambulatory Visit: Payer: Self-pay

## 2022-03-25 ENCOUNTER — Telehealth: Payer: Self-pay | Admitting: Adult Health

## 2022-03-25 MED ORDER — EZETIMIBE 10 MG PO TABS: 10.0000 mg | ORAL_TABLET | Freq: Every day | ORAL | 3 refills | Status: DC

## 2022-03-25 MED ORDER — DONEPEZIL HCL 10 MG PO TABS: 10.0000 mg | ORAL_TABLET | Freq: Every day | ORAL | 11 refills | Status: DC

## 2022-03-25 NOTE — Telephone Encounter (Signed)
Shelly Blevins is calling from [facility]. Requesting a refill on medication donepezil (ARICEPT) 10 MG tablet. Refill should be sent to [facility].

## 2022-03-25 NOTE — Telephone Encounter (Signed)
*  STAT* If patient is at the pharmacy, call can be transferred to refill team.   1. Which medications need to be refilled? (please list name of each medication and dose if known)   ezetimibe (ZETIA) 10 MG tablet    2. Which pharmacy/location (including street and city if local pharmacy) is medication to be sent to? [place], [place]   3. Do they need a 30 day or 90 day supply?  90 day

## 2022-03-25 NOTE — Telephone Encounter (Signed)
Rx reordered and sent to [facility].

## 2022-04-29 ENCOUNTER — Ambulatory Visit: Payer: Medicare HMO | Admitting: Medical

## 2022-04-29 VITALS — BP 110/60 | HR 64 | Ht 62.0 in | Wt 188.6 lb

## 2022-04-29 DIAGNOSIS — D649 Anemia, unspecified: Secondary | ICD-10-CM

## 2022-04-29 DIAGNOSIS — Z0000 Encounter for general adult medical examination without abnormal findings: Secondary | ICD-10-CM | POA: Diagnosis not present

## 2022-04-29 DIAGNOSIS — E559 Vitamin D deficiency, unspecified: Secondary | ICD-10-CM

## 2022-04-29 DIAGNOSIS — E7800 Pure hypercholesterolemia, unspecified: Secondary | ICD-10-CM | POA: Diagnosis not present

## 2022-04-29 DIAGNOSIS — Z79899 Other long term (current) drug therapy: Secondary | ICD-10-CM

## 2022-04-29 DIAGNOSIS — I2583 Coronary atherosclerosis due to lipid rich plaque: Secondary | ICD-10-CM

## 2022-04-29 DIAGNOSIS — E1169 Type 2 diabetes mellitus with other specified complication: Secondary | ICD-10-CM

## 2022-04-29 DIAGNOSIS — E1142 Type 2 diabetes mellitus with diabetic polyneuropathy: Secondary | ICD-10-CM

## 2022-04-29 DIAGNOSIS — Z780 Asymptomatic menopausal state: Secondary | ICD-10-CM

## 2022-04-29 DIAGNOSIS — M1A9XX0 Chronic gout, unspecified, without tophus (tophi): Secondary | ICD-10-CM

## 2022-04-29 DIAGNOSIS — E039 Hypothyroidism, unspecified: Secondary | ICD-10-CM

## 2022-04-29 DIAGNOSIS — Z7185 Encounter for immunization safety counseling: Secondary | ICD-10-CM | POA: Diagnosis not present

## 2022-04-29 DIAGNOSIS — I2510 Atherosclerotic heart disease of native coronary artery without angina pectoris: Secondary | ICD-10-CM

## 2022-04-29 DIAGNOSIS — I10 Essential (primary) hypertension: Secondary | ICD-10-CM

## 2022-04-29 DIAGNOSIS — K219 Gastro-esophageal reflux disease without esophagitis: Secondary | ICD-10-CM

## 2022-04-29 DIAGNOSIS — Z794 Long term (current) use of insulin: Secondary | ICD-10-CM

## 2022-04-29 DIAGNOSIS — E790 Hyperuricemia without signs of inflammatory arthritis and tophaceous disease: Secondary | ICD-10-CM

## 2022-04-29 DIAGNOSIS — E785 Hyperlipidemia, unspecified: Secondary | ICD-10-CM

## 2022-04-29 NOTE — Patient Instructions (Signed)
This visit was a preventative care visit, also known as wellness visit or routine physical.   Topics typically include healthy lifestyle, diet, exercise, preventative care, vaccinations, sick and well care, proper use of emergency dept and after hours care, as well as other concerns.     Recommendations: Continue to return yearly for your annual wellness and preventative care visits.  This gives [place] a chance to discuss healthy lifestyle, exercise, vaccinations, review your chart record, and perform screenings where appropriate.  I recommend you see your eye doctor yearly for routine vision care.    Vaccination recommendations were reviewed Immunization History  Administered Date(s) Administered   Fluad Quad(high Dose 65+) 01/10/2020, 11/25/2020   Influenza-Unspecified 12/31/2017   Moderna Sars-Covid-2 Vaccination 04/01/2019, 04/29/2019, 01/10/2020   Pneumococcal Conjugate-13 10/09/2015   Pneumococcal Polysaccharide-23 04/25/2020   Tdap 11/21/2021   Zoster Recombinat (Shingrix) 11/21/2021   Zoster, Live 12/09/2015   I recommend you check insurance coverage for Shingrix, Tdap vaccine as you are due for both   Screening for cancer: According to [place] preventive services task force, we do not typically do certain screenings including discontinuation of Pap smears beyond age 90, discontinuation of colon cancer screening beyond 79 years old, and discontinuation of mammograms after age 2. I reviewed your 2020 colonoscopy.  Skin cancer screening: Check your skin regularly for new changes, growing lesions, or other lesions of concern Come in for evaluation if you have skin lesions of concern.  Lung cancer screening: If you have a greater than 20 pack year history of tobacco use, then you may qualify for lung cancer screening with a chest CT scan.   Please call your insurance company to inquire about coverage for this test.  We currently don't have screenings for other cancers  besides breast, cervical, colon, and lung cancers.  If you have a strong family history of cancer or have other cancer screening concerns, please let me know.     Bone health: Get at least 150 minutes of aerobic exercise weekly Get weight bearing exercise at least once weekly Bone density test:  A bone density test is an imaging test that uses a type of X-ray to measure the amount of calcium and other minerals in your bones. The test may be used to diagnose or screen you for a condition that causes weak or thin bones (osteoporosis), predict your risk for a broken bone (fracture), or determine how well your osteoporosis treatment is working. The bone density test is recommended for females 64 and older, or females or males <94 if certain risk factors such as thyroid disease, long term use of steroids such as for asthma or rheumatological issues, vitamin D deficiency, estrogen deficiency, family history of osteoporosis, self or family history of fragility fracture in first degree relative.  I reviewed her bone density test from June 2022 which was normal.   Heart health: Get at least 150 minutes of aerobic exercise weekly Limit alcohol It is important to maintain a healthy blood pressure and healthy cholesterol numbers  Heart disease screening: Screening for heart disease includes screening for blood pressure, fasting lipids, glucose/diabetes screening, BMI height to weight ratio, reviewed of smoking status, physical activity, and diet.    Goals include blood pressure 120/80 or less, maintaining a healthy lipid/cholesterol profile, preventing diabetes or keeping diabetes numbers under good control, not smoking or using tobacco products, exercising most days per week or at least 150 minutes per week of exercise, and eating healthy variety of fruits and vegetables,  healthy oils, and avoiding unhealthy food choices like fried food, fast food, high sugar and high cholesterol foods.      Medical  care options: I recommend you continue to seek care here first for routine care.  We try really hard to have available appointments Monday through Friday daytime hours for sick visits, acute visits, and physicals.  Urgent care should be used for after hours and weekends for significant issues that cannot wait till the next day.  The emergency department should be used for significant potentially life-threatening emergencies.  The emergency department is expensive, can often have long wait times for less significant concerns, so try to utilize primary care, urgent care, or telemedicine when possible to avoid unnecessary trips to the emergency department.  Virtual visits and telemedicine have been introduced since the pandemic started in 2020, and can be convenient ways to receive medical care.  We offer virtual appointments as well to assist you in a variety of options to seek medical care.   Advanced Directives: You state that you have worked on some of this paper work.  Please complete Advanced Directives which includes Living Will and [place].  Please get a copy to [place] as well    Other Significant issues: I was able to find your lab results in the computer system and Care Everywhere.  Your recent labs on April 06, 2022 by the diabetes doctors office shows normal liver kidney and electrolytes and blood sugar, cholesterol numbers showed LDL 31, total cholesterol 83, HDL 34 and triglycerides 90.  These look good.  Recent TSH thyroid lab normal, recent hemoglobin A1c 8.5%  Diabetes Recent hemoglobin A1c over 8% Continue routine follow-up with endocrinology Begin Jardiance recently started by endocrinology, continue metformin and insulin/NovoLog  Hypothyroidism -recent thyroid lab was normal.  Continue levothyroxine 50 mcg daily first in the morning on empty stomach before other medications  Dyslipidemia-updated labs today, continue medications  Hypertension Continue   Amlodipine 10 mg daily, Spironolactone 25 mg daily, Ramipril 10 mg daily  High Cholesterol and coronary artery disease-your recent labs were reviewed.  Continue atorvastatin Lipitor 80 mg daily, aspirin 81 mg daily, and Ezetimibe/Zetia 10 mg daily  Memory concerns mild-on Aricept '10mg'$  daily  Abnormal ABI-I reviewed her October 2022 ABI report and vascular surgery notes.  Advise regular exercise and walking  Obesity Lose weight through regular exercise and cutting out some calories  Anemia - of chronic disease.  Hemoccult negative 11/2020, reviewed 2020 colonoscopy, reviewed iron studies from 8/22  Vit D deficiency - continue supplement, routine lab today  GERD- no recent issues, doing fine on current therapy

## 2022-04-29 NOTE — Progress Notes (Signed)
Subjective:    Shelly Blevins is a 77 y.o. female who presents for Preventative Services visit and chronic medical problems/med check visit.    Primary Care Provider Jamarco Zaldivar, Camelia Eng, PA-C here for primary care  Current Health Care Team: Dentist, none, has full dentures Eye doctor, Dr. Dr. Herbert Deaner, Dr. Kathlen Mody Dr. Jacelyn Pi, Eilene Ghazi PA, endocrinology Dr. Lyman Bishop, cardiology Dr. Scarlette Shorts, GI Dr. Sabino Niemann, rheumatology Leontine Locket, [place] and Dr. Harold Barban, vascular surgery [facility] Dr. Sarina Ill and Ward Givens, NP, neurology  Here for well visit.  Living situation includes her 70 year old granddaughter and grandson lives with her.  Her granddaughter helps her keep up with the household but Ms. Tutton bills, does her own ADLs, feeds herself, bathe herself, still drives regularly.  She denies any major memory concerns today.    Medical Services you may have received from other than [facility] providers in the past year (date may be approximate) She just endocrinology 3 weeks ago and her blood work  She also notes that she saw the clinic for the first time on [facility] recently and had a bunch of blood work done there to.  She cannot recall the clinic name or what type of doctor's office it was  Exercise Current exercise habits:  moving around the house    Nutrition/Diet Current diet: in general, a "healthy" diet    Depression Screen    04/29/2022    8:27 AM  Depression screen PHQ 2/9  Decreased Interest 0  Down, Depressed, Hopeless 0  PHQ - 2 Score 0    Activities of Daily Living Screen/Functional Status Survey Is the patient deaf or have difficulty hearing?: No Does the patient have difficulty seeing, even when wearing glasses/contacts?: Yes Does the patient have difficulty concentrating, remembering, or making decisions?: Yes Does the patient have difficulty walking or climbing stairs?: No Does the patient have difficulty  dressing or bathing?: No Does the patient have difficulty doing errands alone such as visiting a doctor's office or shopping?: No  Can patient draw a clock face showing 3:15 oclock, yes  Fall Risk Screen    04/29/2022    8:27 AM 04/28/2021    8:38 AM 11/25/2020   10:34 AM 04/25/2020   10:28 AM 03/28/2019    9:40 AM  [place] in the past year? 0 0 0 0 0  Number falls in past yr: 0 0 0    Injury with Fall? 0 0 0    Risk for fall due to : No Fall Risks No Fall Risks No Fall Risks No Fall Risks   Follow up Falls evaluation completed Falls evaluation completed Falls evaluation completed Falls evaluation completed     Gait Assessment: Normal gait observed yes  Advanced directives Does patient have a [place]? Yes Does patient have a Living Will? ? unsure  Past Medical History:  Diagnosis Date   Allergy    Arthritis    CAD (coronary artery disease)    mild nonobstructive disease, 30% Cfx lesion   Cataract    Diabetes mellitus    insulin-dependent; Dr. Chalmers Cater   Dyslipidemia    GERD (gastroesophageal reflux disease)    Hyperlipidemia    Hypertension    Hypothyroidism    Dr. Chalmers Cater   Stroke [facility])     Past Surgical History:  Procedure Laterality Date   ABDOMINAL HYSTERECTOMY     CARDIAC CATHETERIZATION  02/17/2007   mild  CAD with 50% prox L Cfx stenosis, normal LV function (Dr. Norlene Duel)   [place] WALL MOTION  2009   lexiscan myoview - perfusion defect in anterior myociaral region (breast attenuation) - low risk scan - EF 69%   THYROIDECTOMY     TRANSTHORACIC ECHOCARDIOGRAM  2008   normal study     Social History   Socioeconomic History   Marital status: Married    Spouse name: Not on file   Number of children: 2   Years of education: 10   Highest education level: Not on file  Occupational History    Employer: [employer]  Tobacco Use   Smoking status: Former    Types: Cigarettes     Quit date: 04/02/1986    Years since quitting: 36.0   Smokeless tobacco: Never  Vaping Use   Vaping Use: Never used  Substance and Sexual Activity   Alcohol use: No   Drug use: No   Sexual activity: Not on file  Other Topics Concern   Not on file  Social History Narrative   Not on file   Social Determinants of Health   Financial Resource Strain: Low Risk  (09/19/2020)   Overall Financial Resource Strain (CARDIA)    Difficulty of Paying Living Expenses: Not very hard  Food Insecurity: No Food Insecurity (09/19/2020)   Hunger Vital Sign    Worried About Running Out of Food in the Last Year: Never true    Ran Out of Food in the Last Year: Never true  Transportation Needs: No Transportation Needs (09/19/2020)   PRAPARE - Hydrologist (Medical): No    Lack of Transportation (Non-Medical): No  Physical Activity: Not on file  Stress: Not on file  Social Connections: Not on file  Intimate Partner Violence: Not on file    Family History  Problem Relation Age of Onset   Heart failure Mother    Diabetes Mother    Dementia Sister    Heart failure Brother        died at 20   Colon cancer Neg Hx    Esophageal cancer Neg Hx    Stomach cancer Neg Hx    Rectal cancer Neg Hx    Alzheimer's disease Neg Hx      Current Outpatient Medications:    albuterol (VENTOLIN HFA) 108 (90 Base) MCG/ACT inhaler, INHALE 2 PUFFS INTO THE LUNGS EVERY 6 (SIX) HOURS AS NEEDED FOR WHEEZING OR SHORTNESS OF BREATH., Disp: 3 each, Rfl: 0   allopurinol (ZYLOPRIM) 100 MG tablet, Take 100 mg by mouth daily., Disp: , Rfl:    amLODipine (NORVASC) 10 MG tablet, TAKE 1 TABLET BY MOUTH EVERY DAY, Disp: 90 tablet, Rfl: 0   aspirin EC 81 MG tablet, Take 1 tablet (81 mg total) by mouth daily., Disp: 90 tablet, Rfl: 3   atorvastatin (LIPITOR) 80 MG tablet, Take 1 tablet (80 mg total) by mouth daily., Disp: 90 tablet, Rfl: 3   donepezil (ARICEPT) 10 MG tablet, Take 1 tablet (10 mg total)  by mouth at bedtime. Helps slow down memory loss., Disp: 30 tablet, Rfl: 11   ezetimibe (ZETIA) 10 MG tablet, Take 1 tablet (10 mg total) by mouth daily., Disp: 90 tablet, Rfl: 3   gabapentin (NEURONTIN) 100 MG capsule, Take 100 mg by mouth at bedtime., Disp: , Rfl:    Glucagon (GVOKE HYPOPEN 2-PACK) 1 MG/0.2ML SOAJ, Inject  1 each into the skin as needed., Disp: 0.2 mL, Rfl: 1   latanoprost (XALATAN) 0.005 % ophthalmic solution, Place 1 drop into both eyes at bedtime., Disp: , Rfl: 5   levothyroxine (SYNTHROID, LEVOTHROID) 50 MCG tablet, Take 50 mcg by mouth daily before breakfast., Disp: , Rfl:    metFORMIN (GLUCOPHAGE-XR) 500 MG 24 hr tablet, Take 2 tablets by mouth 2 (two) times daily., Disp: , Rfl:    NOVOLOG MIX 70/30 FLEXPEN (70-30) 100 UNIT/ML FlexPen, Inject 60 Units into the skin 2 (two) times daily. , Disp: , Rfl: 3   omeprazole (PRILOSEC) 40 MG capsule, Take 40 mg by mouth every morning., Disp: , Rfl:    ondansetron (ZOFRAN) 4 MG tablet, Take 1 tablet (4 mg total) by mouth every 8 (eight) hours as needed for nausea or vomiting., Disp: 20 tablet, Rfl: 0   ramipril (ALTACE) 10 MG capsule, Take 1 capsule (10 mg total) by mouth daily. TAKE 1 CAPSULE BY MOUTH EVERY DAY, Disp: 90 capsule, Rfl: 0   spironolactone (ALDACTONE) 25 MG tablet, Take 0.5 tablets (12.5 mg total) by mouth daily., Disp: 45 tablet, Rfl: 0   Vitamin D, Ergocalciferol, (DRISDOL) 1.25 MG (50000 UNIT) CAPS capsule, Take by mouth., Disp: , Rfl:    BD PEN NEEDLE NANO 2ND GEN 32G X 4 MM MISC, 2 (two) times daily. as directed, Disp: , Rfl:    Multiple Vitamins-Minerals (EMERGEN-C IMMUNE PLUS) PACK, Take 1 tablet by mouth 2 (two) times daily. (Patient not taking: Reported on 04/29/2022), Disp: 10 each, Rfl: 0   ONETOUCH VERIO test strip, 3 (three) times daily., Disp: , Rfl:   No Known Allergies  History reviewed: allergies, current medications, past family history, past medical history, past social history, past surgical history  and problem list    Chronic issues discussed: Diabetes-sees endocrinology regularly, compliant with medication, including metformin and insulin but Jardiance was just added as her hemoglobin A1c was 8%.  She is waiting on this to be delivered from the pharmacy  Hypothyroidism-sees endocrinology regularly, compliant with medication  Hyperlipidemia-compliant with medication  High blood pressure-compliant with medication  Elevated uric acid-compliant medication  Memory loss-overall does relatively okay but is on medication help with memory.  She feels like this has been helpful.   Acute issues discussed: No acute new issues today    Objective:   Biometrics BP 110/60   Pulse 64   Ht '5\' 2"'$  (1.575 m)   Wt 188 lb 9.6 oz (85.5 kg)   LMP  (LMP Unknown)   BMI 34.50 kg/m   Wt Readings from Last 3 Encounters:  04/29/22 188 lb 9.6 oz (85.5 kg)  02/23/22 190 lb 12.8 oz (86.5 kg)  12/08/21 186 lb (84.4 kg)   Gen: wd, wn, nad Alert and oriented x 3 Answers questions appropriately HEENT: normocephalic, sclerae anicteric Neck: supple, no lymphadenopathy, no thyromegaly, no masses, no bruits Heart: RRR, normal S1, S2, no murmurs Lungs: CTA bilaterally, no wheezes, rhonchi, or rales Abdomen: +bs, soft, non tender, non distended, no masses, no hepatomegaly, no splenomegaly Musculoskeletal: nontender, no swelling, no obvious deformity Extremities: no edema, no cyanosis, no clubbing Pulses: 2+ symmetric, upper and lower extremities, normal cap refill Neurological: alert, oriented x 3, CN2-12 intact, strength normal upper extremities and lower extremities, sensation normal throughout, DTRs 2+ throughout, no cerebellar signs, gait normal Psychiatric: normal affect, behavior normal, pleasant  Breast/GU/rectal declined  Diabetic Foot Exam - Simple   Simple Foot Form Diabetic Foot exam was performed with the  following findings: Yes 04/29/2022  8:58 AM  Visual Inspection See comments:  Yes Sensation Testing Intact to touch and monofilament testing bilaterally: Yes Pulse Check See comments: Yes Comments 1+ pedal pulses, dry skin, flatfeet, mild tenderness to bilateral heels posteriorly and inferiorly      Assessment:   Encounter Diagnoses  Name Primary?   Encounter for health maintenance examination in adult Yes   Vitamin D deficiency    Vaccine counseling    Post-menopausal    Hypercholesterolemia    High risk medication use    Anemia, unspecified type    Chronic gout without tophus, unspecified cause, unspecified site    Dyslipidemia    Elevated uric acid in blood    Type 2 diabetes mellitus with other specified complication, with long-term current use of insulin (HCC)    Diabetic polyneuropathy associated with type 2 diabetes mellitus (HCC)    Gastroesophageal reflux disease, unspecified whether esophagitis present    Hypothyroidism, unspecified type    Essential hypertension    Coronary artery disease due to lipid rich plaque    Medicare annual wellness visit, subsequent      Plan:   This visit was a preventative care visit, also known as wellness visit or routine physical.   Topics typically include healthy lifestyle, diet, exercise, preventative care, vaccinations, sick and well care, proper use of emergency dept and after hours care, as well as other concerns.     Recommendations: Continue to return yearly for your annual wellness and preventative care visits.  This gives [place] a chance to discuss healthy lifestyle, exercise, vaccinations, review your chart record, and perform screenings where appropriate.  I recommend you see your eye doctor yearly for routine vision care.    Vaccination recommendations were reviewed Immunization History  Administered Date(s) Administered   Fluad Quad(high Dose 65+) 01/10/2020, 11/25/2020   Influenza-Unspecified 12/31/2017   Moderna Sars-Covid-2 Vaccination 04/01/2019, 04/29/2019, 01/10/2020   Pneumococcal  Conjugate-13 10/09/2015   Pneumococcal Polysaccharide-23 04/25/2020   Tdap 11/21/2021   Zoster Recombinat (Shingrix) 11/21/2021   Zoster, Live 12/09/2015   I recommend you check insurance coverage for Shingrix, Tdap vaccine as you are due for both   Screening for cancer: According to [place] preventive services task force, we do not typically do certain screenings including discontinuation of Pap smears beyond age 55, discontinuation of colon cancer screening beyond 79 years old, and discontinuation of mammograms after age 64. I reviewed your 2020 colonoscopy.  Skin cancer screening: Check your skin regularly for new changes, growing lesions, or other lesions of concern Come in for evaluation if you have skin lesions of concern.  Lung cancer screening: If you have a greater than 20 pack year history of tobacco use, then you may qualify for lung cancer screening with a chest CT scan.   Please call your insurance company to inquire about coverage for this test.  We currently don't have screenings for other cancers besides breast, cervical, colon, and lung cancers.  If you have a strong family history of cancer or have other cancer screening concerns, please let me know.     Bone health: Get at least 150 minutes of aerobic exercise weekly Get weight bearing exercise at least once weekly Bone density test:  A bone density test is an imaging test that uses a type of X-ray to measure the amount of calcium and other minerals in your bones. The test may be used to diagnose or screen you for a condition that causes weak or  thin bones (osteoporosis), predict your risk for a broken bone (fracture), or determine how well your osteoporosis treatment is working. The bone density test is recommended for females 72 and older, or females or males <82 if certain risk factors such as thyroid disease, long term use of steroids such as for asthma or rheumatological issues, vitamin D deficiency,  estrogen deficiency, family history of osteoporosis, self or family history of fragility fracture in first degree relative.  I reviewed her bone density test from June 2022 which was normal.   Heart health: Get at least 150 minutes of aerobic exercise weekly Limit alcohol It is important to maintain a healthy blood pressure and healthy cholesterol numbers  Heart disease screening: Screening for heart disease includes screening for blood pressure, fasting lipids, glucose/diabetes screening, BMI height to weight ratio, reviewed of smoking status, physical activity, and diet.    Goals include blood pressure 120/80 or less, maintaining a healthy lipid/cholesterol profile, preventing diabetes or keeping diabetes numbers under good control, not smoking or using tobacco products, exercising most days per week or at least 150 minutes per week of exercise, and eating healthy variety of fruits and vegetables, healthy oils, and avoiding unhealthy food choices like fried food, fast food, high sugar and high cholesterol foods.      Medical care options: I recommend you continue to seek care here first for routine care.  We try really hard to have available appointments Monday through Friday daytime hours for sick visits, acute visits, and physicals.  Urgent care should be used for after hours and weekends for significant issues that cannot wait till the next day.  The emergency department should be used for significant potentially life-threatening emergencies.  The emergency department is expensive, can often have long wait times for less significant concerns, so try to utilize primary care, urgent care, or telemedicine when possible to avoid unnecessary trips to the emergency department.  Virtual visits and telemedicine have been introduced since the pandemic started in 2020, and can be convenient ways to receive medical care.  We offer virtual appointments as well to assist you in a variety of options to seek  medical care.   Advanced Directives: You state that you have worked on some of this paper work.  Please complete Advanced Directives which includes Living Will and [place].  Please get a copy to [place] as well    Other Significant issues: I was able to find your lab results in the computer system and Care Everywhere.  Your recent labs on April 06, 2022 by the diabetes doctors office shows normal liver kidney and electrolytes and blood sugar, cholesterol numbers showed LDL 31, total cholesterol 83, HDL 34 and triglycerides 90.  These look good.  Recent TSH thyroid lab normal, recent hemoglobin A1c 8.5%  Diabetes Recent hemoglobin A1c over 8% Continue routine follow-up with endocrinology Begin Jardiance recently started by endocrinology, continue metformin and insulin/NovoLog  Hypothyroidism -recent thyroid lab was normal.  Continue levothyroxine 50 mcg daily first in the morning on empty stomach before other medications  Dyslipidemia-updated labs today, continue medications  Hypertension Continue  Amlodipine 10 mg daily, Spironolactone 25 mg daily, Ramipril 10 mg daily  High Cholesterol and coronary artery disease-your recent labs were reviewed.  Continue atorvastatin Lipitor 80 mg daily, aspirin 81 mg daily, and Ezetimibe/Zetia 10 mg daily  Memory concerns mild-on Aricept '10mg'$  daily  Abnormal ABI-I reviewed her October 2022 ABI report and vascular surgery notes.  Advise regular exercise and walking  Obesity Lose weight through regular exercise and cutting out some calories  Anemia - of chronic disease.  Hemoccult negative 11/2020, reviewed 2020 colonoscopy, reviewed iron studies from 8/22  Vit D deficiency - continue supplement, routine lab today  GERD- no recent issues, doing fine on current therapy  Stuti was seen today for nonfasting cpe/medcheck.  Diagnoses and all orders for this visit:  Encounter for health maintenance examination in adult -     CBC  with Differential/Platelet -     Iron, TIBC and Ferritin Panel -     Microalbumin/Creatinine Ratio, Urine -     Uric acid -     VITAMIN D 25 Hydroxy (Vit-D Deficiency, Fractures)  Vitamin D deficiency -     VITAMIN D 25 Hydroxy (Vit-D Deficiency, Fractures)  Vaccine counseling  Post-menopausal  Hypercholesterolemia  High risk medication use  Anemia, unspecified type -     CBC with Differential/Platelet -     Iron, TIBC and Ferritin Panel  Chronic gout without tophus, unspecified cause, unspecified site -     Uric acid  Dyslipidemia  Elevated uric acid in blood  Type 2 diabetes mellitus with other specified complication, with long-term current use of insulin (HCC) -     Microalbumin/Creatinine Ratio, Urine  Diabetic polyneuropathy associated with type 2 diabetes mellitus (HCC)  Gastroesophageal reflux disease, unspecified whether esophagitis present  Hypothyroidism, unspecified type  Essential hypertension  Coronary artery disease due to lipid rich plaque  Medicare annual wellness visit, subsequent        Medicare Attestation A preventative services visit was completed today.  During the course of the visit the patient was educated and counseled about appropriate screening and preventive services.  A health risk assessment was established with the patient that included a review of current medications, allergies, social history, family history, medical and preventative health history, biometrics, and preventative screenings to identify potential safety concerns or impairments.  A personalized plan was printed today for the patient's records and use.   Personalized health advice and education was given today to reduce health risks and promote self management and wellness.  Information regarding end of life planning was discussed today.  Dorothea Ogle, PA-C   04/29/2022

## 2022-05-01 ENCOUNTER — Other Ambulatory Visit: Payer: Self-pay | Admitting: Medical

## 2022-05-01 LAB — CBC WITH DIFFERENTIAL/PLATELET
Basophils Absolute: 0 10*3/uL (ref 0.0–0.2)
Basos: 1 %
EOS (ABSOLUTE): 0.2 10*3/uL (ref 0.0–0.4)
Eos: 2 %
Hematocrit: 32.1 % — ABNORMAL LOW (ref 34.0–46.6)
Hemoglobin: 10.6 g/dL — ABNORMAL LOW (ref 11.1–15.9)
Immature Grans (Abs): 0 10*3/uL (ref 0.0–0.1)
Immature Granulocytes: 0 %
Lymphocytes Absolute: 2.4 10*3/uL (ref 0.7–3.1)
Lymphs: 31 %
MCH: 29.1 pg (ref 26.6–33.0)
MCHC: 33 g/dL (ref 31.5–35.7)
MCV: 88 fL (ref 79–97)
Monocytes Absolute: 0.7 10*3/uL (ref 0.1–0.9)
Monocytes: 9 %
Neutrophils Absolute: 4.3 10*3/uL (ref 1.4–7.0)
Neutrophils: 57 %
Platelets: 265 10*3/uL (ref 150–450)
RBC: 3.64 x10E6/uL — ABNORMAL LOW (ref 3.77–5.28)
RDW: 14 % (ref 11.7–15.4)
WBC: 7.5 10*3/uL (ref 3.4–10.8)

## 2022-05-01 LAB — MICROALBUMIN / CREATININE URINE RATIO
Creatinine, Urine: 230.8 mg/dL
Microalb/Creat Ratio: 19 mg/g creat (ref 0–29)
Microalbumin, Urine: 44.4 ug/mL

## 2022-05-01 LAB — IRON,TIBC AND FERRITIN PANEL
Ferritin: 69 ng/mL (ref 15–150)
Iron Saturation: 13 % — ABNORMAL LOW (ref 15–55)
Iron: 43 ug/dL (ref 27–139)
Total Iron Binding Capacity: 334 ug/dL (ref 250–450)
UIBC: 291 ug/dL (ref 118–369)

## 2022-05-01 LAB — VITAMIN D 25 HYDROXY (VIT D DEFICIENCY, FRACTURES): Vit D, 25-Hydroxy: 84.6 ng/mL (ref 30.0–100.0)

## 2022-05-01 LAB — URIC ACID: Uric Acid: 4.8 mg/dL (ref 3.1–7.9)

## 2022-05-01 MED ORDER — RAMIPRIL 10 MG PO CAPS: 10.0000 mg | ORAL_CAPSULE | Freq: Every day | ORAL | 2 refills | Status: DC

## 2022-05-01 MED ORDER — SPIRONOLACTONE 25 MG PO TABS: 12.5000 mg | ORAL_TABLET | Freq: Every day | ORAL | 2 refills | Status: DC

## 2022-05-01 MED ORDER — ATORVASTATIN CALCIUM 80 MG PO TABS: 80.0000 mg | ORAL_TABLET | Freq: Every day | ORAL | 2 refills | Status: DC

## 2022-05-01 MED ORDER — ALBUTEROL SULFATE HFA 108 (90 BASE) MCG/ACT IN AERS: 2.0000 | INHALATION_SPRAY | Freq: Four times a day (QID) | RESPIRATORY_TRACT | 1 refills | Status: DC | PRN

## 2022-05-01 MED ORDER — AMLODIPINE BESYLATE 10 MG PO TABS: 10.0000 mg | ORAL_TABLET | Freq: Every day | ORAL | 2 refills | Status: DC

## 2022-05-01 MED ORDER — ASPIRIN 81 MG PO TBEC: 81.0000 mg | DELAYED_RELEASE_TABLET | Freq: Every day | ORAL | 3 refills | Status: DC

## 2022-05-01 NOTE — Progress Notes (Signed)
Make sure she sees a MyChart message  Follow-up in 6 months

## 2022-05-10 ENCOUNTER — Other Ambulatory Visit: Payer: Self-pay | Admitting: Neurology

## 2022-05-21 ENCOUNTER — Telehealth: Payer: Self-pay | Admitting: Internal Medicine

## 2022-05-21 DIAGNOSIS — I10 Essential (primary) hypertension: Secondary | ICD-10-CM

## 2022-05-21 DIAGNOSIS — Z794 Long term (current) use of insulin: Secondary | ICD-10-CM

## 2022-05-21 NOTE — Telephone Encounter (Signed)
-----   Message from Maren Reamer, [facility] sent at 05/21/2022  2:55 PM EST ----- Regarding: 2300 Referral Needing a 2300-Pharmacy referral placed for management of T2DM/HTN, at request of Camelia Eng. Tysinger, PA-C.  Thank you!  [place] Pharmacist [phone]

## 2022-05-21 NOTE — Telephone Encounter (Signed)
done 

## 2022-05-27 ENCOUNTER — Telehealth: Payer: Self-pay

## 2022-05-27 NOTE — Progress Notes (Signed)
   Care Guide Note  05/27/2022 Name: Shelly Blevins MRN: [id number] DOB: [birth year]  Referred by: Carlena Hurl, PA-C Reason for referral : Care Coordination (Outreach to schedule referral )   Shelly Blevins is a 77 y.o. year old female who is a primary care patient of Caryl Ada. [name] was referred to the pharmacist for assistance related to HTN.    Successful contact was made with the patient to discuss pharmacy services including being ready for the pharmacist to call at least 5 minutes before the scheduled appointment time, to have medication bottles and any blood sugar or blood pressure readings ready for review. The patient agreed to meet with the pharmacist via with the pharmacist via telephone visit on (date/time).  05/29/2022  Noreene Larsson, [place], [place] [id number] Direct Dial: [phone] [email or web address]

## 2022-05-29 ENCOUNTER — Other Ambulatory Visit: Payer: HMO

## 2022-05-29 NOTE — Progress Notes (Signed)
05/29/2022 Name: Shelly Blevins MRN: [id number] DOB: Jul 22, 1945  Chief Complaint  Patient presents with   Medication Management   Shelly Blevins is a 77 y.o. year old female who presented for a telephone visit.   They were referred to the pharmacist by their PCP for assistance in managing diabetes and hypertension.   Patient is participating in a Managed Medicaid Plan:  No  Subjective/Objective:  Care Team: Primary Care Provider: Carlena Hurl, PA-C ; Next Scheduled Visit: 11/09/22  Medication Access/Adherence Patient reports affordability concerns with their medications: Yes -Jardiance Patient reports access/transportation concerns to their pharmacy: No  Patient reports adherence concerns with their medications:  No    Diabetes: -Current medications: Jardiance '10mg'$  daily, Metformin XR '500mg'$  BID, Novolog Mix 70/30 60 units BID -Metformin prescribed as 2 tablets twice daily, but patient endorses excessive gas at this dose and that [facility] provider instructed to decrease to 1 tablet twice daily.  She is tolerating this well. -Most recent clinic A1c 8.5, BG 86 04/06/22 -Current glucose readings: Patient reports recent FBG of 143, 146, 104, 163, 194 -She reports she is checking twice daily, but could only provide 1 reading every few days -States she has some difficulty with current meter and has a new one, but needs help getting it set up -Patient reports hypoglycemic s/sx including dizziness, shakiness, sweating.  States this happens rarely and has not occurred in a while.  Endorses drinking a glass of orange juice and feeling better soon after.  -Patient denies hyperglycemic symptoms including polyuria, polydipsia, polyphagia, nocturia, neuropathy, blurred vision.  Hypertension: -Current medications: amlodipine '10mg'$  daily, spironolactone 12.'5mg'$  daily -Medications previously tried: ramipril '10mg'$  daily recently stopped by [facility] provider, but patient unable to recall reason.   States they are supposed to talk more about medications at visit scheduled for 3/15. -Most recent clinic reading 126/68 when seen at [facility] in January (patient reported) -Patient has a validated, automated, upper arm home BP cuff but is not currently checking at home Patient endorses hypotensive s/sx including dizziness, lightheadedness but states rarely and insignificant. Patient denies hypertensive symptoms including headache, chest pain, shortness of breath  Hyperlipidemia: -Current medications:  Atorvastatin '80mg'$  daily, ASA '81mg'$  daily -Recent lipid panel WNL- LDL 31, TG 90 04/06/22 -States she was also told to hold ezetemibe '10mg'$  daily for now by [facility] and that this is supposed to be discussed at 3/15 visit  Medication Management: -Current adherence strategy: Has pill organizer she is not currently using -Patient reports Good adherence to medications -No issues with access; most medications are mailed to her, but others she is able to pick up from [facility] on routine basis -Patient reports the following barriers to adherence: Recently prescribed Jardiance prescription is >$100 per fill, and she is concerned she will not be able to afford long-term  Medications Reviewed Today     Reviewed by Darlina Guys, [facility] (Pharmacist) on 05/29/22 at 1348  Med List Status: <None>   Medication Order Taking? Sig Documenting Provider Last Dose Status Informant  albuterol (VENTOLIN HFA) 108 (90 Base) MCG/ACT inhaler [id number] No Inhale 2 puffs into the lungs every 6 (six) hours as needed for wheezing or shortness of breath.  Patient not taking: Reported on 05/29/2022   Carlena Hurl, PA-C Not Taking Active            Med Note Colin Rhein, Kenslie Abbruzzese A   Fri May 29, 2022  1:34 PM) No longer taking- has not needed and did  not feel like it worked well when she was  allopurinol (ZYLOPRIM) 100 MG tablet [id number] Yes Take 100 mg by mouth daily. [provider] Taking Active   amLODipine (NORVASC) 10 MG  tablet [id number] Yes Take 1 tablet (10 mg total) by mouth daily. Tysinger, Camelia Eng, PA-C Taking Active   aspirin EC 81 MG tablet [id number] Yes Take 1 tablet (81 mg total) by mouth daily. Tysinger, Camelia Eng, PA-C Taking Active   atorvastatin (LIPITOR) 80 MG tablet [id number] Yes Take 1 tablet (80 mg total) by mouth daily. Tysinger, Camelia Eng, PA-C Taking Active   BD PEN NEEDLE NANO 2ND GEN 32G X 4 MM MISC [id number] Yes 2 (two) times daily. as directed [provider] Taking Active   donepezil (ARICEPT) 10 MG tablet [id number] Yes Take 1 tablet (10 mg total) by mouth at bedtime. Helps slow down memory loss. Ward Givens, NP Taking Active   ezetimibe (ZETIA) 10 MG tablet [id number] No Take 1 tablet (10 mg total) by mouth daily.  Patient not taking: Reported on 05/29/2022   Pixie Casino, MD Not Taking Active            Med Note Colin Rhein, Mylik Pro A   Fri May 29, 2022  1:38 PM) Patient states told to hold until seen again by provider [phone]  famotidine (PEPCID) 40 MG tablet [id number] Yes Take 40 mg by mouth 2 (two) times daily. [provider] Taking Active Self  gabapentin (NEURONTIN) 100 MG capsule [id number] Yes Take 100 mg by mouth at bedtime. [provider] Taking Active   Glucagon (GVOKE HYPOPEN 2-PACK) 1 MG/0.2ML SOAJ [id number] No Inject 1 each into the skin as needed.  Patient not taking: Reported on 05/29/2022   Carlena Hurl, PA-C Not Taking Active            Med Note Colin Rhein, Maxi Rodas A   Fri May 29, 2022  1:46 PM) Has not picked up  latanoprost (XALATAN) 0.005 % ophthalmic solution [id number] Yes Place 1 drop into both eyes at bedtime. [provider] Taking Active Self  levothyroxine (SYNTHROID, LEVOTHROID) 50 MCG tablet [id number] Yes Take 50 mcg by mouth daily before breakfast. [provider] Taking Active Self  metFORMIN (GLUCOPHAGE-XR) 500 MG 24 hr tablet [id number] Yes Take 2 tablets by mouth 2 (two) times daily. [provider] Taking  Active            Med Note Colin Rhein, Lakesa Coste A   Fri May 29, 2022  1:39 PM) Taking 1am and 1pm  NOVOLOG MIX 70/30 FLEXPEN (70-30) 100 UNIT/ML FlexPen [id number] Yes Inject 60 Units into the skin 2 (two) times daily.  [provider] Taking Active Self  ONETOUCH VERIO test strip [id number] Yes 3 (three) times daily. [provider] Taking Active            Med Note Colin Rhein, Marrietta Thunder A   Fri May 29, 2022  1:42 PM) Testing BID  ramipril (ALTACE) 10 MG capsule [id number] No Take 1 capsule (10 mg total) by mouth daily. TAKE 1 CAPSULE BY MOUTH EVERY DAY  Patient not taking: Reported on 05/29/2022   Carlena Hurl, PA-C Not Taking Active            Med Note Colin Rhein, Kamaury Cutbirth A   Fri May 29, 2022  1:43 PM) Instructed to stop until next visit w/provider at [facility]  spironolactone (ALDACTONE) 25 MG tablet [id number] Yes Take 0.5 tablets (12.5 mg total) by mouth daily. Tysinger, Camelia Eng, PA-C  Taking Active   Vitamin D, Ergocalciferol, (DRISDOL) 1.25 MG (50000 UNIT) CAPS capsule [id number] No Take by mouth.  Patient not taking: Reported on 05/29/2022   [provider] Not Taking Active            Med Note Colin Rhein, Jessilyn Catino A   Fri May 29, 2022  1:43 PM) Stopped for 1 month bc levels were above normal           Assessment/Plan:   Diabetes: - Recommend to check glucose if not daily, at least 3 times per week while fasting, record and have for upcoming 3/15 appointments -Suggested taking new meter to pharmacy next time she picks up medication to see if they can assist in set up.  If not, I can schedule an in person appointment to assist. - Meets financial criteria for Jardiance patient assistance program through [facility]. Will collaborate with provider, CPhT, and patient to pursue assistance.  -Dose can be titrated to '25mg'$  if needed to meet glycemic goals  Hypertension: - Currently controlled -Recommended to check home blood pressure and heart rate if not daily, at least 3 times a week, record,  and have for upcoming 3/15 appointments  Hyperlipidemia/ASCVD Risk Reduction: - Currently controlled.  - Recommend to continue current regimen   Medication Management: - Currently strategy insufficient to maintain appropriate adherence to prescribed medication regimen- Initiating application for Jardiance assistance - Suggested use of weekly pill box to organize medications- grand daughter is going to Environmental education officer for her -Recommend follow-up labs for A1c, CMP, and Lipids in 3 months with recent medication changes  Follow Up Plan: Appointment scheduled for 3/15 after her morning appointment with [facility] to see if any medications restarted/changed.  Also going to review BP and BG readings  Darlina Guys, PharmD, DPLA

## 2022-05-29 NOTE — Progress Notes (Unsigned)
   05/29/2022  Patient ID: Shelly Blevins, female   DOB: 1945/06/29, 77 y.o.   MRN: [id number]  Outreach attempt made x2 for scheduled telephone visit to discuss medication management in regard to diabetes and hypertension.  No answer and no voicemail set up on mobile number; and home number is a place of business.  Sending a MyChart message for patient to contact me to reschedule.  Darlina Guys, PharmD, DPLA

## 2022-06-02 ENCOUNTER — Telehealth: Payer: Self-pay

## 2022-06-02 NOTE — Telephone Encounter (Signed)
PAP application for JARDIANCE [facility])   has been mailed to pt home. I will fax PCP pages once I receive pt pages.   [facility] Patient Advocate

## 2022-06-09 NOTE — Telephone Encounter (Signed)
I Have received pt pages and FAXING provider page to office fax [phone] please have provider Tysinger, Shanon Brow PA-C review, fill out and FAX back to [facility] Patient Advocate [phone]

## 2022-06-12 ENCOUNTER — Other Ambulatory Visit: Payer: Self-pay | Admitting: Medical

## 2022-06-12 ENCOUNTER — Other Ambulatory Visit: Payer: HMO

## 2022-06-12 NOTE — Progress Notes (Signed)
   06/12/2022  Patient ID: Shelly Blevins, female   DOB: [birth year], 77 y.o.   MRN: [id number]  Subjective/Objective: Telephone visit to follow-up with patient on status of Jardiance patient assistance application, home BP, and home BG. -PAP application faxed by CPhT to Chana Bode this week; messaging to verify this was received, signed, and faxed back -

## 2022-06-15 NOTE — Telephone Encounter (Signed)
I am reaching out to see it you have receive provider portion?   [facility] Patient Advocate [phone]

## 2022-06-16 MED ORDER — METFORMIN HCL ER 500 MG PO TB24: 500.0000 mg | ORAL_TABLET | Freq: Two times a day (BID) | ORAL | 3 refills | Status: DC

## 2022-06-16 NOTE — Telephone Encounter (Signed)
Sent form up front to be faxed back

## 2022-06-17 ENCOUNTER — Other Ambulatory Visit (HOSPITAL_COMMUNITY): Payer: Self-pay

## 2022-06-18 ENCOUNTER — Telehealth: Payer: Self-pay

## 2022-06-18 NOTE — Telephone Encounter (Signed)
Received notification from [employer]) regarding patient assistance DENIAL for [employer]. Due to pt may be eligible for medication through [place].  Phone: [phone]   Scanned letter into media of chart   Please be advised

## 2022-06-18 NOTE — Progress Notes (Signed)
   06/18/2022  Patient ID: Shelly Blevins, female   DOB: 02/07/1946, 77 y.o.   MRN: [id number]  Contacted patient to inform her that PAP for Jardiance was denied.  I was able to see that she had applied for Medicare Extra Help in the past, but she states she has always been denied and nothing has changed financially since she last applied.  She endorses that she believes she will be able to afford medication, continuing to get a 90 day supply at a time through [facility] mail order for approximately $100.  I did caution that she may hit her donut hole, and to contact us if so.  If this occurs, we can look into samples or alternate medication temporarily.  Darlina Guys, PharmD, DPLA

## 2022-06-22 MED ORDER — METFORMIN HCL ER 500 MG PO TB24: 500.0000 mg | ORAL_TABLET | Freq: Two times a day (BID) | ORAL | 3 refills | Status: DC

## 2022-07-08 ENCOUNTER — Other Ambulatory Visit: Payer: Self-pay | Admitting: Medical

## 2022-07-08 ENCOUNTER — Other Ambulatory Visit: Payer: Self-pay | Admitting: Internal Medicine

## 2022-07-08 ENCOUNTER — Telehealth: Payer: Self-pay | Admitting: Medical

## 2022-07-08 MED ORDER — ATORVASTATIN CALCIUM 80 MG PO TABS: 80.0000 mg | ORAL_TABLET | Freq: Every day | ORAL | 2 refills | Status: DC

## 2022-07-08 MED ORDER — SPIRONOLACTONE 25 MG PO TABS: 12.5000 mg | ORAL_TABLET | Freq: Every day | ORAL | 1 refills | Status: DC

## 2022-07-08 MED ORDER — AMLODIPINE BESYLATE 10 MG PO TABS: 10.0000 mg | ORAL_TABLET | Freq: Every day | ORAL | 2 refills | Status: DC

## 2022-07-08 NOTE — Telephone Encounter (Signed)
Looks like the only recent meds we have filled for her is amlodipine, atorvastatin, spirolactacone.   She seens endocrinology for diabetes so we do not fill diabetes

## 2022-07-08 NOTE — Telephone Encounter (Signed)
Huntley Dec from [facility] called and states Ezme is switching pharmacies to [facility] - [place], [place] - [address]. And she is asking that all medications prescribed by [facility] be sent there. She states especially the Novolog which she is currently needing a refill for. Call back number if needed is [phone]

## 2022-07-08 NOTE — Telephone Encounter (Signed)
Pt sees endocrinology 

## 2022-07-08 NOTE — Telephone Encounter (Signed)
Shelly Blevins was notified that we do not refill her diabetes meds

## 2022-07-09 ENCOUNTER — Other Ambulatory Visit: Payer: Self-pay | Admitting: Medical

## 2022-07-09 NOTE — Telephone Encounter (Signed)
Pt was notified that her meds were called in

## 2022-07-10 NOTE — Progress Notes (Signed)
Per notes, pt was denied for PAP and was contacted regarding LIS

## 2022-07-13 ENCOUNTER — Other Ambulatory Visit: Payer: Self-pay | Admitting: Medical

## 2022-08-04 ENCOUNTER — Encounter: Payer: Self-pay | Admitting: Gastroenterology

## 2022-08-14 ENCOUNTER — Other Ambulatory Visit: Payer: Self-pay | Admitting: Medical

## 2022-08-18 DIAGNOSIS — M25521 Pain in right elbow: Secondary | ICD-10-CM | POA: Diagnosis not present

## 2022-08-18 DIAGNOSIS — M25531 Pain in right wrist: Secondary | ICD-10-CM | POA: Diagnosis not present

## 2022-08-18 DIAGNOSIS — M25511 Pain in right shoulder: Secondary | ICD-10-CM | POA: Diagnosis not present

## 2022-08-19 ENCOUNTER — Emergency Department (HOSPITAL_COMMUNITY): Payer: Medicare HMO

## 2022-08-19 ENCOUNTER — Encounter (HOSPITAL_COMMUNITY): Payer: Self-pay

## 2022-08-19 ENCOUNTER — Other Ambulatory Visit: Payer: Self-pay

## 2022-08-19 ENCOUNTER — Observation Stay (HOSPITAL_COMMUNITY)
Admission: EM | Admit: 2022-08-19 | Discharge: 2022-08-21 | Disposition: A | Discharge status: Home / Self Care | Payer: Medicare HMO | Attending: Internal Medicine | Admitting: Internal Medicine

## 2022-08-19 DIAGNOSIS — M1A9XX0 Chronic gout, unspecified, without tophus (tophi): Secondary | ICD-10-CM | POA: Diagnosis present

## 2022-08-19 DIAGNOSIS — K219 Gastro-esophageal reflux disease without esophagitis: Secondary | ICD-10-CM | POA: Diagnosis present

## 2022-08-19 DIAGNOSIS — E162 Hypoglycemia, unspecified: Secondary | ICD-10-CM

## 2022-08-19 DIAGNOSIS — D649 Anemia, unspecified: Secondary | ICD-10-CM | POA: Diagnosis not present

## 2022-08-19 DIAGNOSIS — Z87891 Personal history of nicotine dependence: Secondary | ICD-10-CM | POA: Diagnosis not present

## 2022-08-19 DIAGNOSIS — W19XXXA Unspecified fall, initial encounter: Secondary | ICD-10-CM | POA: Diagnosis not present

## 2022-08-19 DIAGNOSIS — Z79899 Other long term (current) drug therapy: Secondary | ICD-10-CM | POA: Insufficient documentation

## 2022-08-19 DIAGNOSIS — Z8673 Personal history of transient ischemic attack (TIA), and cerebral infarction without residual deficits: Secondary | ICD-10-CM | POA: Insufficient documentation

## 2022-08-19 DIAGNOSIS — I2583 Coronary atherosclerosis due to lipid rich plaque: Secondary | ICD-10-CM

## 2022-08-19 DIAGNOSIS — E161 Other hypoglycemia: Secondary | ICD-10-CM | POA: Diagnosis not present

## 2022-08-19 DIAGNOSIS — R4182 Altered mental status, unspecified: Secondary | ICD-10-CM | POA: Diagnosis not present

## 2022-08-19 DIAGNOSIS — E1140 Type 2 diabetes mellitus with diabetic neuropathy, unspecified: Secondary | ICD-10-CM | POA: Diagnosis present

## 2022-08-19 DIAGNOSIS — R531 Weakness: Secondary | ICD-10-CM | POA: Diagnosis not present

## 2022-08-19 DIAGNOSIS — I959 Hypotension, unspecified: Secondary | ICD-10-CM | POA: Diagnosis not present

## 2022-08-19 DIAGNOSIS — Z7984 Long term (current) use of oral hypoglycemic drugs: Secondary | ICD-10-CM | POA: Insufficient documentation

## 2022-08-19 DIAGNOSIS — E1142 Type 2 diabetes mellitus with diabetic polyneuropathy: Secondary | ICD-10-CM

## 2022-08-19 DIAGNOSIS — Z7982 Long term (current) use of aspirin: Secondary | ICD-10-CM | POA: Diagnosis not present

## 2022-08-19 DIAGNOSIS — R55 Syncope and collapse: Secondary | ICD-10-CM | POA: Diagnosis not present

## 2022-08-19 DIAGNOSIS — I10 Essential (primary) hypertension: Secondary | ICD-10-CM | POA: Diagnosis not present

## 2022-08-19 DIAGNOSIS — Z794 Long term (current) use of insulin: Secondary | ICD-10-CM

## 2022-08-19 DIAGNOSIS — Z043 Encounter for examination and observation following other accident: Secondary | ICD-10-CM | POA: Diagnosis not present

## 2022-08-19 DIAGNOSIS — E1169 Type 2 diabetes mellitus with other specified complication: Secondary | ICD-10-CM | POA: Diagnosis not present

## 2022-08-19 DIAGNOSIS — E11649 Type 2 diabetes mellitus with hypoglycemia without coma: Principal | ICD-10-CM | POA: Insufficient documentation

## 2022-08-19 DIAGNOSIS — I2510 Atherosclerotic heart disease of native coronary artery without angina pectoris: Secondary | ICD-10-CM | POA: Insufficient documentation

## 2022-08-19 DIAGNOSIS — R42 Dizziness and giddiness: Secondary | ICD-10-CM | POA: Diagnosis present

## 2022-08-19 DIAGNOSIS — E785 Hyperlipidemia, unspecified: Secondary | ICD-10-CM | POA: Diagnosis not present

## 2022-08-19 DIAGNOSIS — R413 Other amnesia: Secondary | ICD-10-CM | POA: Diagnosis present

## 2022-08-19 DIAGNOSIS — E119 Type 2 diabetes mellitus without complications: Secondary | ICD-10-CM

## 2022-08-19 DIAGNOSIS — E039 Hypothyroidism, unspecified: Secondary | ICD-10-CM | POA: Diagnosis not present

## 2022-08-19 LAB — CBG MONITORING, ED
Glucose-Capillary: 70 mg/dL (ref 70–99)
Glucose-Capillary: 51 mg/dL — ABNORMAL LOW (ref 70–99)

## 2022-08-19 LAB — COMPREHENSIVE METABOLIC PANEL
ALT: 27 U/L (ref 0–44)
AST: 56 U/L — ABNORMAL HIGH (ref 15–41)
Albumin: 4 g/dL (ref 3.5–5.0)
Alkaline Phosphatase: 70 U/L (ref 38–126)
Anion gap: 12 (ref 5–15)
BUN: 22 mg/dL (ref 8–23)
CO2: 24 mmol/L (ref 22–32)
Calcium: 8.6 mg/dL — ABNORMAL LOW (ref 8.9–10.3)
Chloride: 99 mmol/L (ref 98–111)
Creatinine, Ser: 1.19 mg/dL — ABNORMAL HIGH (ref 0.44–1.00)
GFR, Estimated: 47 mL/min — ABNORMAL LOW (ref >60)
Glucose, Bld: 97 mg/dL (ref 70–99)
Potassium: 4.2 mmol/L (ref 3.5–5.1)
Sodium: 135 mmol/L (ref 135–145)
Total Bilirubin: 0.8 mg/dL (ref 0.3–1.2)
Total Protein: 8 g/dL (ref 6.5–8.1)

## 2022-08-19 LAB — URINALYSIS, ROUTINE W REFLEX MICROSCOPIC
Bilirubin Urine: NEGATIVE
Glucose, UA: 150 mg/dL — AB
Hgb urine dipstick: NEGATIVE
Ketones, ur: NEGATIVE mg/dL
Nitrite: NEGATIVE
Protein, ur: NEGATIVE mg/dL
Specific Gravity, Urine: 1.01 (ref 1.005–1.030)
pH: 6 (ref 5.0–8.0)

## 2022-08-19 LAB — CBC WITH DIFFERENTIAL/PLATELET
Abs Immature Granulocytes: 0.05 10*3/uL (ref 0.00–0.07)
Basophils Absolute: 0 10*3/uL (ref 0.0–0.1)
Basophils Relative: 0 %
Eosinophils Absolute: 0.2 10*3/uL (ref 0.0–0.5)
Eosinophils Relative: 2 %
HCT: 36.3 % (ref 36.0–46.0)
Hemoglobin: 11.5 g/dL — ABNORMAL LOW (ref 12.0–15.0)
Immature Granulocytes: 1 %
Lymphocytes Relative: 18 %
Lymphs Abs: 1.9 10*3/uL (ref 0.7–4.0)
MCH: 28.8 pg (ref 26.0–34.0)
MCHC: 31.7 g/dL (ref 30.0–36.0)
MCV: 90.8 fL (ref 80.0–100.0)
Monocytes Absolute: 0.9 10*3/uL (ref 0.1–1.0)
Monocytes Relative: 9 %
Neutro Abs: 7.3 10*3/uL (ref 1.7–7.7)
Neutrophils Relative %: 70 %
Platelets: 237 10*3/uL (ref 150–400)
RBC: 4 MIL/uL (ref 3.87–5.11)
RDW: 14.2 % (ref 11.5–15.5)
WBC: 10.4 10*3/uL (ref 4.0–10.5)
nRBC: 0 % (ref 0.0–0.2)

## 2022-08-19 LAB — GLUCOSE, CAPILLARY: Glucose-Capillary: 145 mg/dL — ABNORMAL HIGH (ref 70–99)

## 2022-08-19 MED ORDER — DONEPEZIL HCL 10 MG PO TABS
10.0000 mg | ORAL_TABLET | Freq: Every day | ORAL | Status: DC
  Administered 2022-08-19 – 2022-08-20 (×2): 10 mg via ORAL
  Filled 2022-08-19 (×2): qty 1

## 2022-08-19 MED ORDER — LEVOTHYROXINE SODIUM 50 MCG PO TABS
50.0000 ug | ORAL_TABLET | Freq: Every day | ORAL | Status: DC
  Administered 2022-08-20 – 2022-08-21 (×2): 50 ug via ORAL
  Filled 2022-08-19 (×2): qty 1

## 2022-08-19 MED ORDER — INSULIN ASPART 100 UNIT/ML IJ SOLN
0.0000 [IU] | Freq: Three times a day (TID) | INTRAMUSCULAR | Status: DC
  Administered 2022-08-20: 2 [IU] via SUBCUTANEOUS

## 2022-08-19 MED ORDER — GABAPENTIN 100 MG PO CAPS
100.0000 mg | ORAL_CAPSULE | Freq: Every day | ORAL | Status: DC
  Administered 2022-08-19 – 2022-08-20 (×2): 100 mg via ORAL
  Filled 2022-08-19 (×2): qty 1

## 2022-08-19 MED ORDER — ACETAMINOPHEN 325 MG PO TABS: 650.0000 mg | ORAL_TABLET | Freq: Four times a day (QID) | ORAL | Status: DC | PRN

## 2022-08-19 MED ORDER — INSULIN ASPART 100 UNIT/ML IJ SOLN: 0.0000 [IU] | Freq: Every day | INTRAMUSCULAR | Status: DC

## 2022-08-19 MED ORDER — ALLOPURINOL 100 MG PO TABS
100.0000 mg | ORAL_TABLET | Freq: Every day | ORAL | Status: DC
  Administered 2022-08-20 – 2022-08-21 (×2): 100 mg via ORAL
  Filled 2022-08-19 (×2): qty 1

## 2022-08-19 MED ORDER — ACETAMINOPHEN 650 MG RE SUPP: 650.0000 mg | Freq: Four times a day (QID) | RECTAL | Status: DC | PRN

## 2022-08-19 MED ORDER — ASPIRIN 81 MG PO TBEC
81.0000 mg | DELAYED_RELEASE_TABLET | Freq: Every day | ORAL | Status: DC
  Administered 2022-08-20 – 2022-08-21 (×2): 81 mg via ORAL
  Filled 2022-08-19 (×2): qty 1

## 2022-08-19 MED ORDER — SODIUM CHLORIDE 0.9% FLUSH
3.0000 mL | Freq: Two times a day (BID) | INTRAVENOUS | Status: DC
  Administered 2022-08-19 – 2022-08-21 (×4): 3 mL via INTRAVENOUS

## 2022-08-19 MED ORDER — POLYETHYLENE GLYCOL 3350 17 G PO PACK
17.0000 g | PACK | Freq: Every day | ORAL | Status: DC | PRN
  Administered 2022-08-20: 17 g via ORAL
  Filled 2022-08-19: qty 1

## 2022-08-19 MED ORDER — FAMOTIDINE 20 MG PO TABS
40.0000 mg | ORAL_TABLET | Freq: Two times a day (BID) | ORAL | Status: DC
  Administered 2022-08-19 – 2022-08-21 (×4): 40 mg via ORAL
  Filled 2022-08-19 (×5): qty 2

## 2022-08-19 MED ORDER — SODIUM CHLORIDE 0.9 % IV BOLUS
1000.0000 mL | Freq: Once | INTRAVENOUS | Status: AC
  Administered 2022-08-19: 1000 mL via INTRAVENOUS

## 2022-08-19 MED ORDER — ENOXAPARIN SODIUM 40 MG/0.4ML IJ SOSY
40.0000 mg | PREFILLED_SYRINGE | INTRAMUSCULAR | Status: DC
  Administered 2022-08-19 – 2022-08-20 (×2): 40 mg via SUBCUTANEOUS
  Filled 2022-08-19 (×2): qty 0.4

## 2022-08-19 MED ORDER — ATORVASTATIN CALCIUM 80 MG PO TABS
80.0000 mg | ORAL_TABLET | Freq: Every day | ORAL | Status: DC
  Administered 2022-08-20 – 2022-08-21 (×2): 80 mg via ORAL
  Filled 2022-08-19 (×2): qty 1

## 2022-08-19 MED ORDER — HYDROCODONE-ACETAMINOPHEN 5-325 MG PO TABS
1.0000 | ORAL_TABLET | Freq: Four times a day (QID) | ORAL | Status: AC | PRN
  Administered 2022-08-19 – 2022-08-20 (×2): 1 via ORAL
  Filled 2022-08-19 (×2): qty 1

## 2022-08-19 MED ORDER — SODIUM CHLORIDE 0.9 % IV SOLN: INTRAVENOUS | Status: DC

## 2022-08-19 NOTE — ED Notes (Signed)
Left message for pt's daughter that the pt is being transported to her room 2W37. Her name is Shelly Blevins and her # is [phone].

## 2022-08-19 NOTE — ED Provider Notes (Signed)
EMERGENCY DEPARTMENT AT [facility] Provider Note   CSN: [id number] Arrival date & time: 08/19/22  1015     History  Chief Complaint  Patient presents with   Dizziness    Shelly Blevins is a 77 y.o. female.  HPI Patient presents with her daughter who assists with the history. Patient presents after a fall that occurred today.  Daughter notes that she was in the other room, her fall.  Patient recalls feeling weak, heavy, fell, no head trauma, no head pain, neck pain.  Per EMS glucose was 65, and the patient received dextrose.  Patient's general contacts is notable for history of TIA, ongoing right arm pain.  She was seen by orthopedics yesterday, is wearing a right wrist splint.  He denies focal weakness currently, focal discoordination currently. Patient is noted to be on donepezil, but seems to provide her own history reasonably well.     Home Medications Prior to Admission medications   Medication Sig Start Date End Date Taking? Authorizing Provider  allopurinol (ZYLOPRIM) 100 MG tablet Take 1 Tablet by mouth once daily 07/14/22   Tysinger, Kermit Balo, PA-C  amLODipine (NORVASC) 10 MG tablet Take 1 tablet (10 mg total) by mouth daily. 07/08/22   Tysinger, Kermit Balo, PA-C  aspirin EC 81 MG tablet Take 1 tablet (81 mg total) by mouth daily. 05/01/22   Tysinger, Kermit Balo, PA-C  atorvastatin (LIPITOR) 80 MG tablet Take 1 tablet (80 mg total) by mouth daily. 07/08/22   Tysinger, Kermit Balo, PA-C  BD PEN NEEDLE NANO 2ND GEN 32G X 4 MM MISC 2 (two) times daily. as directed 03/08/19   [provider]  donepezil (ARICEPT) 10 MG tablet Take 1 tablet (10 mg total) by mouth at bedtime. Helps slow down memory loss. 03/25/22   Butch Penny, NP  famotidine (PEPCID) 40 MG tablet Take 40 mg by mouth 2 (two) times daily.    [provider]  gabapentin (NEURONTIN) 100 MG capsule Take 100 mg by mouth at bedtime. 04/13/22   [provider]  Glucagon (GVOKE HYPOPEN  2-PACK) 1 MG/0.2ML SOAJ Inject 1 each into the skin as needed. Patient not taking: Reported on 05/29/2022 05/13/21   Tysinger, Kermit Balo, PA-C  latanoprost (XALATAN) 0.005 % ophthalmic solution Place 1 drop into both eyes at bedtime. 06/18/17   [provider]  levothyroxine (SYNTHROID) 50 MCG tablet Take 1 Tablet by mouth once daily 08/14/22   Tysinger, Kermit Balo, PA-C  losartan (COZAAR) 50 MG tablet Take 50 mg by mouth daily.    [provider]  metFORMIN (GLUCOPHAGE-XR) 500 MG 24 hr tablet Take 1 tablet (500 mg total) by mouth 2 (two) times daily. 06/22/22   Tysinger, Kermit Balo, PA-C  NOVOLOG MIX 70/30 FLEXPEN (70-30) 100 UNIT/ML FlexPen Inject 60 Units into the skin 2 (two) times daily.  06/15/17   [provider]  [employer] VERIO test strip 3 (three) times daily. 10/16/19   [provider]  spironolactone (ALDACTONE) 25 MG tablet Take 0.5 tablets (12.5 mg total) by mouth daily. 07/08/22   Tysinger, Kermit Balo, PA-C  Vitamin D, Ergocalciferol, (DRISDOL) 1.25 MG (50000 UNIT) CAPS capsule Take by mouth. Patient not taking: Reported on 05/29/2022 10/28/20   [provider]      Allergies    Patient has no known allergies.    Review of Systems   Review of Systems  All other systems reviewed and are negative.   Physical Exam Updated Vital Signs  BP (!) 173/70   Pulse 98   Temp 97.7 F (36.5 C) (Oral)   Resp 18   Ht 5\' 2"  (1.575 m)   Wt 83.9 kg   LMP  (LMP Unknown)   SpO2 97%   BMI 33.84 kg/m  Physical Exam Vitals and nursing note reviewed.  Constitutional:      General: She is not in acute distress.    Appearance: She is well-developed. She is obese. She is not ill-appearing, toxic-appearing or diaphoretic.  HENT:     Head: Normocephalic and atraumatic.  Eyes:     Conjunctiva/sclera: Conjunctivae normal.  Cardiovascular:     Rate and Rhythm: Normal rate and regular rhythm.  Pulmonary:     Effort: Pulmonary effort is normal. No respiratory distress.      Breath sounds: Normal breath sounds. No stridor.  Abdominal:     General: There is no distension.  Musculoskeletal:     Comments: Patient describes right arm pain with motion, but no obvious deformity of the shoulder, elbow  Skin:    General: Skin is warm and dry.  Neurological:     General: No focal deficit present.     Mental Status: She is alert.     Cranial Nerves: No cranial nerve deficit.     Comments: Patient moves all extremities to command and spontaneously.  Face is symmetric, speech is clear.  Psychiatric:        Mood and Affect: Mood normal.     ED Results / Procedures / Treatments   Labs (all labs ordered are listed, but only abnormal results are displayed) Labs Reviewed  COMPREHENSIVE METABOLIC PANEL - Abnormal; Notable for the following components:      Result Value   Creatinine, Ser 1.19 (*)    Calcium 8.6 (*)    AST 56 (*)    GFR, Estimated 47 (*)    All other components within normal limits  CBC WITH DIFFERENTIAL/PLATELET - Abnormal; Notable for the following components:   Hemoglobin 11.5 (*)    All other components within normal limits  URINALYSIS, ROUTINE W REFLEX MICROSCOPIC - Abnormal; Notable for the following components:   Color, Urine STRAW (*)    Glucose, UA 150 (*)    Leukocytes,Ua TRACE (*)    Bacteria, UA RARE (*)    All other components within normal limits  CBG MONITORING, ED - Abnormal; Notable for the following components:   Glucose-Capillary 51 (*)    All other components within normal limits  CBG MONITORING, ED    EKG EKG Interpretation  Date/Time:  Wednesday Aug 19 2022 10:24:23 EDT Ventricular Rate:  64 PR Interval:  197 QRS Duration: 82 QT Interval:  525 QTC Calculation: 542 R Axis:   26 Text Interpretation: Sinus rhythm Low voltage, precordial leads Nonspecific T abnrm, anterolateral leads Prolonged QT interval Abnormal ECG Confirmed by Gerhard Munch [phone]) on 08/19/2022 11:12:40 AM  Radiology CT HEAD WO  CONTRAST  Result Date: 08/19/2022 CLINICAL DATA:  Provided history: Mental status change, unknown cause. EXAM: CT HEAD WITHOUT CONTRAST TECHNIQUE: Contiguous axial images were obtained from the base of the skull through the vertex without intravenous contrast. RADIATION DOSE REDUCTION: This exam was performed according to the departmental dose-optimization program which includes automated exposure control, adjustment of the mA and/or kV according to patient size and/or use of iterative reconstruction technique. COMPARISON:  Brain MRI 01/20/2017 (images available, report unavailable). Head CT 06/18/2006. FINDINGS: Brain: Mild generalized parenchymal atrophy. Patchy and ill-defined hypoattenuation within the cerebral  white matter, nonspecific but compatible with mild chronic small vessel ischemic disease. There is no acute intracranial hemorrhage. No demarcated cortical infarct. No extra-axial fluid collection. No evidence of an intracranial mass. No midline shift. Vascular: No hyperdense vessel.  Atherosclerotic calcifications. Skull: No fracture or aggressive osseous lesion. Sinuses/Orbits: Bilateral proptosis. No significant paranasal sinus disease at the imaged levels. IMPRESSION: 1.  No evidence of an acute intracranial abnormality. 2. Mild chronic small vessel ischemic changes within the cerebral white matter. 3. Mild generalized parenchymal atrophy. 4. Bilateral proptosis. Electronically Signed   By: Jackey Loge D.O.   On: 08/19/2022 11:59   DG Chest Port 1 View  Result Date: 08/19/2022 CLINICAL DATA:  ALOC EXAM: PORTABLE CHEST 1 VIEW COMPARISON:  CXR 04/16/17 FINDINGS: No pleural effusion. No pneumothorax. No focal airspace opacity. No radiographically apparent displaced rib fractures. Visualized upper abdomen is unremarkable. Normal cardiac and mediastinal contours. IMPRESSION: No focal airspace opacity. Electronically Signed   By: Lorenza Cambridge M.D.   On: 08/19/2022 11:39    Procedures Procedures     Medications Ordered in ED Medications  sodium chloride 0.9 % bolus 1,000 mL (0 mLs Intravenous Stopped 08/19/22 1517)    And  0.9 %  sodium chloride infusion ( Intravenous New Bag/Given 08/19/22 1536)    ED Course/ Medical Decision Making/ A&P                             Medical Decision Making Elderly female presents after a fall.  Patient description of weakness, heaviness prior to the fall, and EMS report of hypoglycemia suggest possible etiology. Patient is seemingly back to baseline neurologically, but TIA is another possibility. Patient had labs, CT, x-ray, monitoring started. Cardiac 60 sinus normal Pulse ox 100% room air normal   Amount and/or Complexity of Data Reviewed Independent Historian: caregiver and EMS    Details: Daughter HPI, EMS HPI External Data Reviewed: notes. Labs: ordered. Decision-making details documented in ED Course. Radiology: ordered and independent interpretation performed. Decision-making details documented in ED Course. ECG/medicine tests: ordered and independent interpretation performed. Decision-making details documented in ED Course.  Risk Prescription drug management. Decision regarding hospitalization.   3:39 PM Patient had another episode of hypoglycemia, glucose 51.  She is now calm, drinking juice.  Adult female presents after above 1 episode of fall/skin with hyperglycemia, on arrival has received dextrose per EMS, is awake and alert, does have other ongoing complaints, but greatest concern is for hypoglycemia.  Patient was monitored here for hours, and after a period of being unremarkable, had a recurrent episode of hypoglycemia, in spite of having eaten.  No evidence for concurrent infection, little evidence for concurrent stroke.  Some suspicion for the patient's medications, dietary intake contributing to her recurrent hypoglycemia, but given the circumstances with fall, hypoglycemia, weakness patient was admitted for monitoring,  management, additional medication adjustments.         Final Clinical Impression(s) / ED Diagnoses Final diagnoses:  Hypoglycemia  Fall, initial encounter     Gerhard Munch, MD 08/19/22 1542

## 2022-08-19 NOTE — ED Triage Notes (Signed)
Pt to er, pt states that she went to get up this and her legs were weak and heavy, states that she fell, states that ems came and give her some sugar.  Pt states that she feels a little bit better now.  Pt denies dizziness at this time, states that she has a light headache.    Ems states that her sugar was 65 and they gave 30g of oral dextrose.

## 2022-08-19 NOTE — ED Notes (Signed)
Pt's blood glucose is 51. MD aware. Gave the pt 2 Ojs and a sandwich.

## 2022-08-19 NOTE — ED Notes (Signed)
Got patient into a gown on the monitor did EKG shown to Dr Jeraldine Loots patient is resting with call bell in reach got patient some warm blankets

## 2022-08-19 NOTE — Progress Notes (Signed)
CBG checked at 1737. CBG 140.

## 2022-08-19 NOTE — H&P (Signed)
History and Physical   Shelly Blevins ZOX:[id number] DOB: [birth year] DOA: 08/19/2022  PCP: Jac Canavan, PA-C   Patient coming from: Home  Chief Complaint: Fall  HPI: Shelly Blevins is a 77 y.o. female with medical history significant of hypothyroidism, hypertension, hyperlipidemia, diabetes, neuropathy, GERD, CAD, gout, anemia, CVA, memory deficit presenting after a fall.  Patient reports feeling a weakness and heavy sensation and then falling at home.  Daughter heard patient fall from the other room.  Patient denies hitting her head.  She reports numbness and tingling in her legs.  Denies loss of consciousness.  EMS was called and found to have glucose of 65 and she was given dextrose.  She denies fevers, chills, chest pain, shortness of breath, abdominal pain, constipation, diarrhea, nausea, vomiting.  ED Course: Vital signs in the ED notable for blood pressure in the 150s to 170s systolic.  Lab workup included CMP with creatinine stable 1.19, calcium 8.6, AST 56.  CBC with hemoglobin stable 11.5.  Urinalysis with leukocytes and rare bacteria only.  Imaging workup included chest x-ray without acute abnormality and CT head without acute abnormality.  Patient received a liter of IV fluids and started on a rate of fluids in the ED.  Review of Systems: As per HPI otherwise all other systems reviewed and are negative.  Past Medical History:  Diagnosis Date   Allergy    Arthritis    CAD (coronary artery disease)    mild nonobstructive disease, 30% Cfx lesion   Cataract    Diabetes mellitus    insulin-dependent; Dr. Talmage Nap   Dyslipidemia    GERD (gastroesophageal reflux disease)    Hyperlipidemia    Hypertension    Hypothyroidism    Dr. Talmage Nap   Stroke [facility])     Past Surgical History:  Procedure Laterality Date   ABDOMINAL HYSTERECTOMY     CARDIAC CATHETERIZATION  02/17/2007   mild CAD with 50% prox L Cfx stenosis, normal LV function (Dr. Claudia Desanctis)   CHOLECYSTECTOMY      EYE SURGERY     NM MYOCAR PERF WALL MOTION  2009   lexiscan myoview - perfusion defect in anterior myociaral region (breast attenuation) - low risk scan - EF 69%   THYROIDECTOMY     TRANSTHORACIC ECHOCARDIOGRAM  2008   normal study     Social History  reports that she quit smoking about 36 years ago. Her smoking use included cigarettes. She has never used smokeless tobacco. She reports that she does not drink alcohol and does not use drugs.  No Known Allergies  Family History  Problem Relation Age of Onset   Heart failure Mother    Diabetes Mother    Dementia Sister    Heart failure Brother        died at 72   Colon cancer Neg Hx    Esophageal cancer Neg Hx    Stomach cancer Neg Hx    Rectal cancer Neg Hx    Alzheimer's disease Neg Hx   Reviewed on admission  Prior to Admission medications   Medication Sig Start Date End Date Taking? Authorizing Provider  allopurinol (ZYLOPRIM) 100 MG tablet Take 1 Tablet by mouth once daily 07/14/22   Tysinger, Kermit Balo, PA-C  amLODipine (NORVASC) 10 MG tablet Take 1 tablet (10 mg total) by mouth daily. 07/08/22   Tysinger, Kermit Balo, PA-C  aspirin EC 81 MG tablet Take 1 tablet (81 mg total) by mouth daily. 05/01/22   Tysinger, Kermit Balo,  PA-C  atorvastatin (LIPITOR) 80 MG tablet Take 1 tablet (80 mg total) by mouth daily. 07/08/22   Tysinger, Kermit Balo, PA-C  BD PEN NEEDLE NANO 2ND GEN 32G X 4 MM MISC 2 (two) times daily. as directed 03/08/19   [provider]  donepezil (ARICEPT) 10 MG tablet Take 1 tablet (10 mg total) by mouth at bedtime. Helps slow down memory loss. 03/25/22   Butch Penny, NP  famotidine (PEPCID) 40 MG tablet Take 40 mg by mouth 2 (two) times daily.    [provider]  gabapentin (NEURONTIN) 100 MG capsule Take 100 mg by mouth at bedtime. 04/13/22   [provider]  Glucagon (GVOKE HYPOPEN 2-PACK) 1 MG/0.2ML SOAJ Inject 1 each into the skin as needed. Patient not taking: Reported on 05/29/2022 05/13/21    Tysinger, Kermit Balo, PA-C  latanoprost (XALATAN) 0.005 % ophthalmic solution Place 1 drop into both eyes at bedtime. 06/18/17   [provider]  levothyroxine (SYNTHROID) 50 MCG tablet Take 1 Tablet by mouth once daily 08/14/22   Tysinger, Kermit Balo, PA-C  losartan (COZAAR) 50 MG tablet Take 50 mg by mouth daily.    [provider]  metFORMIN (GLUCOPHAGE-XR) 500 MG 24 hr tablet Take 1 tablet (500 mg total) by mouth 2 (two) times daily. 06/22/22   Tysinger, Kermit Balo, PA-C  NOVOLOG MIX 70/30 FLEXPEN (70-30) 100 UNIT/ML FlexPen Inject 60 Units into the skin 2 (two) times daily.  06/15/17   [provider]  [facility] VERIO test strip 3 (three) times daily. 10/16/19   [provider]  spironolactone (ALDACTONE) 25 MG tablet Take 0.5 tablets (12.5 mg total) by mouth daily. 07/08/22   Tysinger, Kermit Balo, PA-C  Vitamin D, Ergocalciferol, (DRISDOL) 1.25 MG (50000 UNIT) CAPS capsule Take by mouth. Patient not taking: Reported on 05/29/2022 10/28/20   [provider]    Physical Exam: Vitals:   08/19/22 1435 08/19/22 1438 08/19/22 1445 08/19/22 1610  BP: (!) 106/92  (!) 173/70 (!) 166/68  Pulse: 98     Resp: 18  18 16   Temp:  97.7 F (36.5 C)  97.6 F (36.4 C)  TempSrc:  Oral  Oral  SpO2: 97%   98%  Weight:      Height:        Physical Exam Constitutional:      General: She is not in acute distress.    Appearance: Normal appearance. She is obese.  HENT:     Head: Normocephalic and atraumatic.     Mouth/Throat:     Mouth: Mucous membranes are moist.     Pharynx: Oropharynx is clear.  Eyes:     Extraocular Movements: Extraocular movements intact.     Pupils: Pupils are equal, round, and reactive to light.  Cardiovascular:     Rate and Rhythm: Normal rate and regular rhythm.     Pulses: Normal pulses.     Heart sounds: Normal heart sounds.  Pulmonary:     Effort: Pulmonary effort is normal. No respiratory distress.     Breath sounds: Normal breath sounds.   Abdominal:     General: Bowel sounds are normal. There is no distension.     Palpations: Abdomen is soft.     Tenderness: There is no abdominal tenderness.  Musculoskeletal:        General: No swelling or deformity.  Skin:    General: Skin is warm and dry.  Neurological:     General: No focal deficit present.  Mental Status: Mental status is at baseline.    Labs on Admission: I have personally reviewed following labs and imaging studies  CBC: Recent Labs  Lab 08/19/22 1234  WBC 10.4  NEUTROABS 7.3  HGB 11.5*  HCT 36.3  MCV 90.8  PLT 237    Basic Metabolic Panel: Recent Labs  Lab 08/19/22 1234  NA 135  K 4.2  CL 99  CO2 24  GLUCOSE 97  BUN 22  CREATININE 1.19*  CALCIUM 8.6*    GFR: Estimated Creatinine Clearance: 39.1 mL/min (A) (by C-G formula based on SCr of 1.19 mg/dL (H)).  Liver Function Tests: Recent Labs  Lab 08/19/22 1234  AST 56*  ALT 27  ALKPHOS 70  BILITOT 0.8  PROT 8.0  ALBUMIN 4.0    Urine analysis:    Component Value Date/Time   COLORURINE STRAW (A) 08/19/2022 1135   APPEARANCEUR CLEAR 08/19/2022 1135   APPEARANCEUR CANCELED 04/28/2021 0912   LABSPEC 1.010 08/19/2022 1135   LABSPEC 1.020 05/12/2021 1034   PHURINE 6.0 08/19/2022 1135   GLUCOSEU 150 (A) 08/19/2022 1135   HGBUR NEGATIVE 08/19/2022 1135   BILIRUBINUR NEGATIVE 08/19/2022 1135   BILIRUBINUR small (A) 05/12/2021 1034   BILIRUBINUR CANCELED 04/28/2021 0912   KETONESUR NEGATIVE 08/19/2022 1135   PROTEINUR NEGATIVE 08/19/2022 1135   NITRITE NEGATIVE 08/19/2022 1135   LEUKOCYTESUR TRACE (A) 08/19/2022 1135    Radiological Exams on Admission: CT HEAD WO CONTRAST  Result Date: 08/19/2022 CLINICAL DATA:  Provided history: Mental status change, unknown cause. EXAM: CT HEAD WITHOUT CONTRAST TECHNIQUE: Contiguous axial images were obtained from the base of the skull through the vertex without intravenous contrast. RADIATION DOSE REDUCTION: This exam was performed  according to the departmental dose-optimization program which includes automated exposure control, adjustment of the mA and/or kV according to patient size and/or use of iterative reconstruction technique. COMPARISON:  Brain MRI 01/20/2017 (images available, report unavailable). Head CT 06/18/2006. FINDINGS: Brain: Mild generalized parenchymal atrophy. Patchy and ill-defined hypoattenuation within the cerebral white matter, nonspecific but compatible with mild chronic small vessel ischemic disease. There is no acute intracranial hemorrhage. No demarcated cortical infarct. No extra-axial fluid collection. No evidence of an intracranial mass. No midline shift. Vascular: No hyperdense vessel.  Atherosclerotic calcifications. Skull: No fracture or aggressive osseous lesion. Sinuses/Orbits: Bilateral proptosis. No significant paranasal sinus disease at the imaged levels. IMPRESSION: 1.  No evidence of an acute intracranial abnormality. 2. Mild chronic small vessel ischemic changes within the cerebral white matter. 3. Mild generalized parenchymal atrophy. 4. Bilateral proptosis. Electronically Signed   By: Jackey Loge D.O.   On: 08/19/2022 11:59   DG Chest Port 1 View  Result Date: 08/19/2022 CLINICAL DATA:  ALOC EXAM: PORTABLE CHEST 1 VIEW COMPARISON:  CXR 04/16/17 FINDINGS: No pleural effusion. No pneumothorax. No focal airspace opacity. No radiographically apparent displaced rib fractures. Visualized upper abdomen is unremarkable. Normal cardiac and mediastinal contours. IMPRESSION: No focal airspace opacity. Electronically Signed   By: Lorenza Cambridge M.D.   On: 08/19/2022 11:39    EKG: Independently reviewed.  Sinus rhythm at 64 bpm.  Nonspecific T wave flattening.  Low voltage multiple leads.  QTc prolonged at 542.  Assessment/Plan Principal Problem:   Hypoglycemia associated with diabetes (HCC) Active Problems:   GERD   Essential hypertension   Dyslipidemia   Coronary artery disease due to lipid rich  plaque   Hypothyroidism   Memory change   Diabetes mellitus (HCC)   Diabetic neuropathy (HCC)   Anemia  Chronic gout without tophus   Fall   Near syncope   ?Near syncope Fall Hypoglycemia > Patient with fall where she had weakness, heavy sensation with accompanying tingling and numbness in her legs at home today. > Denies loss of consciousness > EMS did find glucose to be 65 and this improved with dextrose but had repeat hypoglycemia in the 50s in the ED. > Presumed etiology is hypoglycemia.  Does have prolonged QTc. - Monitor on telemetry overnight - Dextrose as needed - Medication adjustment as below - Orthostatic vital signs  Diabetes > On multiple medications including 70/30 insulin 60 units twice daily.  Has not taken it since last night and despite this had hypoglycemia as above. - Will likely need dose adjustment - Will hold off on long-acting insulin - SSI  Hypothyroidism - Continue home Synthroid  Hypertension - Continue home losartan, spironolactone - Holding home amlodipine  Hyperlipidemia CAD > History of nonobstructive CAD. - Continue home aspirin, atorvastatin  Neuropathy - Continue home gabapentin  Gout - Continue home allopurinol   Anemia > Hemoglobin stable 11.5. - Trend CBC  Memory issues - Continue home donepezil  GERD - Continue home Pepcid  DVT prophylaxis: Lovenox Code Status:   Full Family Communication:  None on admission.  Previously updated by EDP. Disposition Plan:   Patient is from:  Home  Anticipated DC to:  Home  Anticipated DC date:  1 to 2 days  Anticipated DC barriers: None  Consults called:  None Admission status:  Observation, telemetry  Severity of Illness: The appropriate patient status for this patient is OBSERVATION. Observation status is judged to be reasonable and necessary in order to provide the required intensity of service to ensure the patient's safety. The patient's presenting symptoms, physical exam  findings, and initial radiographic and laboratory data in the context of their medical condition is felt to place them at decreased risk for further clinical deterioration. Furthermore, it is anticipated that the patient will be medically stable for discharge from the hospital within 2 midnights of admission.    Synetta Fail MD [facility]  How to contact the [facility] Attending or Consulting provider 7A - 7P or covering provider during after hours 7P -7A, for this patient?   Check the care team in [facility] and look for a) attending/consulting [facility] provider listed and b) the [facility] team listed Log into [email or web address] and use [facility] universal password to access. If you do not have the password, please contact the hospital operator. Locate the [facility] provider you are looking for under [facility] and page to a number that you can be directly reached. If you still have difficulty reaching the provider, please page the [facility] (Director on Call) for the Hospitalists listed on amion for assistance.  08/19/2022, 4:22 PM

## 2022-08-19 NOTE — ED Notes (Signed)
ED TO INPATIENT HANDOFF REPORT  ED Nurse Name and Phone #: Vernona Rieger [phone]  S Name/Age/Gender Shelly Blevins 77 y.o. female Room/Bed: 005C/005C  Code Status   Code Status: Full Code  Home/SNF/Other Home Patient oriented to: self, place, time, and situation Is this baseline? Yes   Triage Complete: Triage complete  Chief Complaint Syncope and collapse [R55]  Triage Note Pt to er, pt states that she went to get up this and her legs were weak and heavy, states that she fell, states that ems came and give her some sugar.  Pt states that she feels a little bit better now.  Pt denies dizziness at this time, states that she has a light headache.    Ems states that her sugar was 65 and they gave 30g of oral dextrose.     Allergies No Known Allergies  Level of Care/Admitting Diagnosis ED Disposition     ED Disposition  Admit   Condition  --   Comment  Hospital Area: [facility] [[id number]]  Level of Care: Telemetry Medical [104]  May place patient in observation at [facility] or [facility] if equivalent level of care is available:: No  Covid Evaluation: Asymptomatic - no recent exposure (last 10 days) testing not required  Diagnosis: Syncope and collapse [780.2.ICD-9-CM]  Admitting Physician: Synetta Fail [[id number]]  Attending Physician: Synetta Fail [[id number]]          B Medical/Surgery History Past Medical History:  Diagnosis Date   Allergy    Arthritis    CAD (coronary artery disease)    mild nonobstructive disease, 30% Cfx lesion   Cataract    Diabetes mellitus    insulin-dependent; Dr. Talmage Nap   Dyslipidemia    GERD (gastroesophageal reflux disease)    Hyperlipidemia    Hypertension    Hypothyroidism    Dr. Talmage Nap   Stroke [facility])    Past Surgical History:  Procedure Laterality Date   ABDOMINAL HYSTERECTOMY     CARDIAC CATHETERIZATION  02/17/2007   mild CAD with 50% prox L Cfx stenosis, normal LV function (Dr. Claudia Desanctis)    CHOLECYSTECTOMY     EYE SURGERY     NM MYOCAR PERF WALL MOTION  2009   lexiscan myoview - perfusion defect in anterior myociaral region (breast attenuation) - low risk scan - EF 69%   THYROIDECTOMY     TRANSTHORACIC ECHOCARDIOGRAM  2008   normal study      A IV Location/Drains/Wounds Patient Lines/Drains/Airways Status     Active Line/Drains/Airways     Name Placement date Placement time Site Days   Peripheral IV 08/19/22 20 G Left Antecubital 08/19/22  1235  Antecubital  less than 1            Intake/Output Last 24 hours  Intake/Output Summary (Last 24 hours) at 08/19/2022 1611 Last data filed at 08/19/2022 1517 Gross per 24 hour  Intake 1000 ml  Output --  Net 1000 ml    Labs/Imaging Results for orders placed or performed during the hospital encounter of 08/19/22 (from the past 48 hour(s))  CBG monitoring, ED     Status: None   Collection Time: 08/19/22 11:04 AM  Result Value Ref Range   Glucose-Capillary 70 70 - 99 mg/dL    Comment: Glucose reference range applies only to samples taken after fasting for at least 8 hours.  Urinalysis, Routine w reflex microscopic -Urine, Clean Catch     Status: Abnormal   Collection Time: 08/19/22  11:35 AM  Result Value Ref Range   Color, Urine STRAW (A) YELLOW   APPearance CLEAR CLEAR   Specific Gravity, Urine 1.010 1.005 - 1.030   pH 6.0 5.0 - 8.0   Glucose, UA 150 (A) NEGATIVE mg/dL   Hgb urine dipstick NEGATIVE NEGATIVE   Bilirubin Urine NEGATIVE NEGATIVE   Ketones, ur NEGATIVE NEGATIVE mg/dL   Protein, ur NEGATIVE NEGATIVE mg/dL   Nitrite NEGATIVE NEGATIVE   Leukocytes,Ua TRACE (A) NEGATIVE   RBC / HPF 0-5 0 - 5 RBC/hpf   WBC, UA 0-5 0 - 5 WBC/hpf   Bacteria, UA RARE (A) NONE SEEN   Squamous Epithelial / HPF 0-5 0 - 5 /HPF    Comment: Performed at [facility], [address], [place], [place] [address]  Comprehensive metabolic panel     Status: Abnormal   Collection Time: 08/19/22 12:34 PM  Result Value Ref  Range   Sodium 135 135 - 145 mmol/L   Potassium 4.2 3.5 - 5.1 mmol/L   Chloride 99 98 - 111 mmol/L   CO2 24 22 - 32 mmol/L   Glucose, Bld 97 70 - 99 mg/dL    Comment: Glucose reference range applies only to samples taken after fasting for at least 8 hours.   BUN 22 8 - 23 mg/dL   Creatinine, Ser 1.91 (H) 0.44 - 1.00 mg/dL   Calcium 8.6 (L) 8.9 - 10.3 mg/dL   Total Protein 8.0 6.5 - 8.1 g/dL   Albumin 4.0 3.5 - 5.0 g/dL   AST 56 (H) 15 - 41 U/L   ALT 27 0 - 44 U/L   Alkaline Phosphatase 70 38 - 126 U/L   Total Bilirubin 0.8 0.3 - 1.2 mg/dL   GFR, Estimated 47 (L) >60 mL/min    Comment: (NOTE) Calculated using the CKD-EPI Creatinine Equation (2021)    Anion gap 12 5 - 15    Comment: Performed at [facility], [address], [place], [place] [address]  CBC with Differential/Platelet     Status: Abnormal   Collection Time: 08/19/22 12:34 PM  Result Value Ref Range   WBC 10.4 4.0 - 10.5 K/uL   RBC 4.00 3.87 - 5.11 MIL/uL   Hemoglobin 11.5 (L) 12.0 - 15.0 g/dL   HCT 56.2 13.0 - 86.5 %   MCV 90.8 80.0 - 100.0 fL   MCH 28.8 26.0 - 34.0 pg   MCHC 31.7 30.0 - 36.0 g/dL   RDW 78.4 69.6 - 29.5 %   Platelets 237 150 - 400 K/uL   nRBC 0.0 0.0 - 0.2 %   Neutrophils Relative % 70 %   Neutro Abs 7.3 1.7 - 7.7 K/uL   Lymphocytes Relative 18 %   Lymphs Abs 1.9 0.7 - 4.0 K/uL   Monocytes Relative 9 %   Monocytes Absolute 0.9 0.1 - 1.0 K/uL   Eosinophils Relative 2 %   Eosinophils Absolute 0.2 0.0 - 0.5 K/uL   Basophils Relative 0 %   Basophils Absolute 0.0 0.0 - 0.1 K/uL   Immature Granulocytes 1 %   Abs Immature Granulocytes 0.05 0.00 - 0.07 K/uL    Comment: Performed at [facility], [address], [place], [place] [address]  CBG monitoring, ED     Status: Abnormal   Collection Time: 08/19/22  3:26 PM  Result Value Ref Range   Glucose-Capillary 51 (L) 70 - 99 mg/dL    Comment: Glucose reference range applies only to samples taken  after fasting for at least 8 hours.    Comment 1 Document in Chart    CT HEAD WO CONTRAST  Result Date: 08/19/2022 CLINICAL DATA:  Provided history: Mental status change, unknown cause. EXAM: CT HEAD WITHOUT CONTRAST TECHNIQUE: Contiguous axial images were obtained from the base of the skull through the vertex without intravenous contrast. RADIATION DOSE REDUCTION: This exam was performed according to the departmental dose-optimization program which includes automated exposure control, adjustment of the mA and/or kV according to patient size and/or use of iterative reconstruction technique. COMPARISON:  Brain MRI 01/20/2017 (images available, report unavailable). Head CT 06/18/2006. FINDINGS: Brain: Mild generalized parenchymal atrophy. Patchy and ill-defined hypoattenuation within the cerebral white matter, nonspecific but compatible with mild chronic small vessel ischemic disease. There is no acute intracranial hemorrhage. No demarcated cortical infarct. No extra-axial fluid collection. No evidence of an intracranial mass. No midline shift. Vascular: No hyperdense vessel.  Atherosclerotic calcifications. Skull: No fracture or aggressive osseous lesion. Sinuses/Orbits: Bilateral proptosis. No significant paranasal sinus disease at the imaged levels. IMPRESSION: 1.  No evidence of an acute intracranial abnormality. 2. Mild chronic small vessel ischemic changes within the cerebral white matter. 3. Mild generalized parenchymal atrophy. 4. Bilateral proptosis. Electronically Signed   By: Jackey Loge D.O.   On: 08/19/2022 11:59   DG Chest Port 1 View  Result Date: 08/19/2022 CLINICAL DATA:  ALOC EXAM: PORTABLE CHEST 1 VIEW COMPARISON:  CXR 04/16/17 FINDINGS: No pleural effusion. No pneumothorax. No focal airspace opacity. No radiographically apparent displaced rib fractures. Visualized upper abdomen is unremarkable. Normal cardiac and mediastinal contours. IMPRESSION: No focal airspace opacity. Electronically Signed   By: Lorenza Cambridge M.D.   On:  08/19/2022 11:39    Pending Labs Unresulted Labs (From admission, onward)     Start     Ordered   08/26/22 0500  Creatinine, serum  (enoxaparin (LOVENOX)    CrCl >/= 30 ml/min)  Weekly,   R     Comments: while on enoxaparin therapy    08/19/22 1601   08/20/22 0500  Comprehensive metabolic panel  Tomorrow morning,   R        08/19/22 1601   08/20/22 0500  CBC  Tomorrow morning,   R        08/19/22 1601            Vitals/Pain Today's Vitals   08/19/22 1435 08/19/22 1438 08/19/22 1445 08/19/22 1610  BP: (!) 106/92  (!) 173/70 (!) 166/68  Pulse: 98     Resp: 18  18 16   Temp:  97.7 F (36.5 C)  97.6 F (36.4 C)  TempSrc:  Oral  Oral  SpO2: 97%   98%  Weight:      Height:      PainSc:        Isolation Precautions No active isolations  Medications Medications  sodium chloride 0.9 % bolus 1,000 mL (0 mLs Intravenous Stopped 08/19/22 1517)    And  0.9 %  sodium chloride infusion ( Intravenous New Bag/Given 08/19/22 1536)  allopurinol (ZYLOPRIM) tablet 100 mg (has no administration in time range)  aspirin EC tablet 81 mg (has no administration in time range)  atorvastatin (LIPITOR) tablet 80 mg (has no administration in time range)  donepezil (ARICEPT) tablet 10 mg (has no administration in time range)  levothyroxine (SYNTHROID) tablet 50 mcg (has no administration in time range)  famotidine (PEPCID) tablet 40 mg (has no administration in time range)  gabapentin (NEURONTIN) capsule 100 mg (  has no administration in time range)  sodium chloride flush (NS) 0.9 % injection 3 mL (has no administration in time range)  enoxaparin (LOVENOX) injection 40 mg (has no administration in time range)  acetaminophen (TYLENOL) tablet 650 mg (has no administration in time range)    Or  acetaminophen (TYLENOL) suppository 650 mg (has no administration in time range)  polyethylene glycol (MIRALAX / GLYCOLAX) packet 17 g (has no administration in time range)    Mobility walks with person  assist     Focused Assessments Cardiac Assessment Handoff:    No results found for: "CKTOTAL", "CKMB", "CKMBINDEX", "TROPONINI" No results found for: "DDIMER" Does the Patient currently have chest pain? No    R Recommendations: See Admitting Provider Note  Report given to:   Additional Notes: Brace on R arm for rheumatoid arthritis. Ambulates safely with stand-by assist

## 2022-08-19 NOTE — ED Notes (Signed)
Walked patient to the bathroom patient did well patient is now back in bed on the monitor with call bell in reach and family at bedside 

## 2022-08-19 NOTE — ED Notes (Signed)
Checked patient cbg it was 54 notified RN of blood sugar patient is resting with call bell in reach and family at bedside

## 2022-08-20 DIAGNOSIS — E11649 Type 2 diabetes mellitus with hypoglycemia without coma: Secondary | ICD-10-CM | POA: Diagnosis not present

## 2022-08-20 LAB — CBC
HCT: 32.2 % — ABNORMAL LOW (ref 36.0–46.0)
Hemoglobin: 10.4 g/dL — ABNORMAL LOW (ref 12.0–15.0)
MCH: 28.7 pg (ref 26.0–34.0)
MCHC: 32.3 g/dL (ref 30.0–36.0)
MCV: 88.7 fL (ref 80.0–100.0)
Platelets: 240 10*3/uL (ref 150–400)
RBC: 3.63 MIL/uL — ABNORMAL LOW (ref 3.87–5.11)
RDW: 14.2 % (ref 11.5–15.5)
WBC: 10 10*3/uL (ref 4.0–10.5)
nRBC: 0 % (ref 0.0–0.2)

## 2022-08-20 LAB — GLUCOSE, CAPILLARY
Glucose-Capillary: 140 mg/dL — ABNORMAL HIGH (ref 70–99)
Glucose-Capillary: 121 mg/dL — ABNORMAL HIGH (ref 70–99)
Glucose-Capillary: 144 mg/dL — ABNORMAL HIGH (ref 70–99)
Glucose-Capillary: 202 mg/dL — ABNORMAL HIGH (ref 70–99)
Glucose-Capillary: 172 mg/dL — ABNORMAL HIGH (ref 70–99)

## 2022-08-20 LAB — COMPREHENSIVE METABOLIC PANEL
ALT: 26 U/L (ref 0–44)
AST: 42 U/L — ABNORMAL HIGH (ref 15–41)
Albumin: 3.4 g/dL — ABNORMAL LOW (ref 3.5–5.0)
Alkaline Phosphatase: 62 U/L (ref 38–126)
Anion gap: 10 (ref 5–15)
BUN: 17 mg/dL (ref 8–23)
CO2: 25 mmol/L (ref 22–32)
Calcium: 8.4 mg/dL — ABNORMAL LOW (ref 8.9–10.3)
Chloride: 100 mmol/L (ref 98–111)
Creatinine, Ser: 1.13 mg/dL — ABNORMAL HIGH (ref 0.44–1.00)
GFR, Estimated: 50 mL/min — ABNORMAL LOW (ref >60)
Glucose, Bld: 127 mg/dL — ABNORMAL HIGH (ref 70–99)
Potassium: 3.9 mmol/L (ref 3.5–5.1)
Sodium: 135 mmol/L (ref 135–145)
Total Bilirubin: 0.6 mg/dL (ref 0.3–1.2)
Total Protein: 7.2 g/dL (ref 6.5–8.1)

## 2022-08-20 MED ORDER — INSULIN ASPART PROT & ASPART (70-30 MIX) 100 UNIT/ML ~~LOC~~ SUSP
5.0000 [IU] | Freq: Two times a day (BID) | SUBCUTANEOUS | Status: DC
  Administered 2022-08-20 – 2022-08-21 (×2): 5 [IU] via SUBCUTANEOUS
  Filled 2022-08-20: qty 10

## 2022-08-20 MED ORDER — ACETAMINOPHEN 500 MG PO TABS
1000.0000 mg | ORAL_TABLET | Freq: Two times a day (BID) | ORAL | Status: DC
  Administered 2022-08-20 – 2022-08-21 (×3): 1000 mg via ORAL
  Filled 2022-08-20 (×3): qty 2

## 2022-08-20 MED ORDER — INSULIN LISPRO PROT & LISPRO (75-25 MIX) 100 UNIT/ML ~~LOC~~ SUSP
5.0000 [IU] | Freq: Two times a day (BID) | SUBCUTANEOUS | Status: DC
  Filled 2022-08-20: qty 10

## 2022-08-20 MED ORDER — INSULIN ASPART PROT & ASPART (70-30 MIX) 100 UNIT/ML ~~LOC~~ SUSP: 5.0000 [IU] | Freq: Two times a day (BID) | SUBCUTANEOUS | Status: DC

## 2022-08-20 MED ORDER — IBUPROFEN 200 MG PO TABS
400.0000 mg | ORAL_TABLET | Freq: Three times a day (TID) | ORAL | Status: DC | PRN
  Administered 2022-08-20: 400 mg via ORAL
  Filled 2022-08-20: qty 2

## 2022-08-20 MED ORDER — LATANOPROST 0.005 % OP SOLN
1.0000 [drp] | Freq: Every day | OPHTHALMIC | Status: DC
  Administered 2022-08-20: 1 [drp] via OPHTHALMIC
  Filled 2022-08-20: qty 2.5

## 2022-08-20 MED ORDER — INSULIN ASPART 100 UNIT/ML IJ SOLN
0.0000 [IU] | Freq: Three times a day (TID) | INTRAMUSCULAR | Status: DC
  Administered 2022-08-20: 2 [IU] via SUBCUTANEOUS

## 2022-08-20 NOTE — Inpatient Diabetes Management (Signed)
Inpatient Diabetes Program Recommendations  AACE/ADA: New Consensus Statement on Inpatient Glycemic Control (2015)  Target Ranges:  Prepandial:   less than 140 mg/dL      Peak postprandial:   less than 180 mg/dL (1-2 hours)      Critically ill patients:  140 - 180 mg/dL   Lab Results  Component Value Date   GLUCAP 121 (H) 08/20/2022   HGBA1C 7.5 (H) 04/25/2020    Latest Reference Range & Units 08/19/22 11:04 08/19/22 15:26 08/19/22 17:42 08/19/22 20:27 08/20/22 08:24  Glucose-Capillary 70 - 99 mg/dL 70 51 (L) 161 (H) 096 (H) 121 (H)  (L): Data is abnormally low (H): Data is abnormally high  Diabetes history: DM2 Outpatient Diabetes medications: Novolog 70/30 insulin 60 units bid, Metformin 500 mg bid Current orders for Inpatient glycemic control: Novolog 0-15 units tid correction, 0-5 units hs  Inpatient Diabetes Program Recommendations:   Met with patient and granddaughter @ bedside regarding diabetes management. Patient shared that she does have some low CBG readings in the 50's usually fasting. Patient states she reported to her PCP but doses have not been changed @ this time. Reviewed with patient to notify PCP when she has lows and take meter or readings to office to verify how many lows she is having. Discussed hypoglycemia protocol with patient and she was able to verbally review.  Please consider: -Add Novolog 70/30 insulin 5 units bid ac meals -Change Novolog correction to 0-9 units tid, 0-5 units hs Will need insulin dose adjusted or held @ discharge.  Thank you, Shelly Blevins. Mohd Clemons, RN, MSN, CDE  Diabetes Coordinator Inpatient Glycemic Control Team Team Pager [phone] (8am-5pm) 08/20/2022 12:59 PM

## 2022-08-20 NOTE — Evaluation (Signed)
Physical Therapy Evaluation Patient Details Name: Shelly Blevins MRN: [id number] DOB: [birth year] Today's Date: 08/20/2022  History of Present Illness  Pt is a 77 y/o female presenting 5/22 after fall.  PMH includes: HTN, DM, neuropathy, CAD, gout, anemia, CVA, memory deficit.  Clinical Impression   Pt presents with generalized weakness, impaired balance with history of falls, and decreased activity tolerance. Pt to benefit from acute PT to address deficits. Pt ambulated hallway distance x2, seated rest break needed to recover fatigue and manage LLE pain. Pt does not require AD for mobility at this time. given deficits and history of falls, PT recommending OPPT to address strengthening, balance. PT to progress mobility as tolerated, and will continue to follow acutely.          Recommendations for follow up therapy are one component of a multi-disciplinary discharge planning process, led by the attending physician.  Recommendations may be updated based on patient status, additional functional criteria and insurance authorization.  Follow Up Recommendations       Assistance Recommended at Discharge Set up Supervision/Assistance  Patient can return home with the following  A little help with walking and/or transfers;A little help with bathing/dressing/bathroom    Equipment Recommendations None recommended by PT  Recommendations for Other Services       Functional Status Assessment Patient has had a recent decline in their functional status and demonstrates the ability to make significant improvements in function in a reasonable and predictable amount of time.     Precautions / Restrictions Precautions Precautions: Fall Restrictions Weight Bearing Restrictions: No      Mobility  Bed Mobility Overal bed mobility: Needs Assistance Bed Mobility: Supine to Sit, Sit to Supine     Supine to sit: Supervision, HOB elevated Sit to supine: Min assist, HOB elevated   General bed  mobility comments: repositioning upon return to supine, use of bedrails    Transfers Overall transfer level: Needs assistance Equipment used: None Transfers: Sit to/from Stand Sit to Stand: Min guard           General transfer comment: safety, stand x2    Ambulation/Gait Ambulation/Gait assistance: Min guard Gait Distance (Feet): 100 Feet (x2 - to and from nurses station with seated rest break) Assistive device: None Gait Pattern/deviations: Step-through pattern, Decreased stride length, Trunk flexed Gait velocity: decr     General Gait Details: close guard for safety, pt intermittently reaching for environment to self-steady  Stairs            Wheelchair Mobility    Modified Rankin (Stroke Patients Only)       Balance Overall balance assessment: Needs assistance, History of Falls Sitting-balance support: No upper extremity supported, Feet supported Sitting balance-Leahy Scale: Fair     Standing balance support: Single extremity supported, During functional activity Standing balance-Leahy Scale: Fair Standing balance comment: occasionally reaching for environment                             Pertinent Vitals/Pain Pain Assessment Pain Assessment: Faces Faces Pain Scale: Hurts little more Pain Location: R hand, L leg Pain Descriptors / Indicators: Discomfort, Tender, Sore Pain Intervention(s): Limited activity within patient's tolerance, Monitored during session, Repositioned    Home Living Family/patient expects to be discharged to:: Private residence Living Arrangements: Children Available Help at Discharge: Family;Available PRN/intermittently Type of Home: House Home Access: Ramped entrance       Home Layout: One level Home Equipment: Shower  seat - built in;Grab bars - tub/shower Additional Comments: alone at 3pm and sometimes in the evening    Prior Function Prior Level of Function : Independent/Modified Independent;History of Falls  (last six months)             Mobility Comments: no AD ADLs Comments: reports some assist with IADLs but managing ADls without assist.  intermittent since R hand pain     Hand Dominance   Dominant Hand: Left    Extremity/Trunk Assessment   Upper Extremity Assessment Upper Extremity Assessment: Defer to OT evaluation    Lower Extremity Assessment Lower Extremity Assessment: Generalized weakness    Cervical / Trunk Assessment Cervical / Trunk Assessment: Normal  Communication   Communication: No difficulties  Cognition Arousal/Alertness: Awake/alert Behavior During Therapy: WFL for tasks assessed/performed Overall Cognitive Status: History of cognitive impairments - at baseline                                 General Comments: per chart hx of memory deficit, pt endorsing "my memory isn't what it used to be". Pleasant, good sense of humor        General Comments      Exercises     Assessment/Plan    PT Assessment Patient needs continued PT services  PT Problem List Decreased strength;Decreased mobility;Decreased activity tolerance;Decreased balance;Pain;Decreased knowledge of use of DME       PT Treatment Interventions DME instruction;Therapeutic activities;Gait training;Therapeutic exercise;Patient/family education;Balance training;Stair training;Functional mobility training;Neuromuscular re-education    PT Goals (Current goals can be found in the Care Plan section)  Acute Rehab PT Goals PT Goal Formulation: With patient Time For Goal Achievement: 09/03/22 Potential to Achieve Goals: Good    Frequency Min 3X/week     Co-evaluation               AM-PAC PT "6 Clicks" Mobility  Outcome Measure Help needed turning from your back to your side while in a flat bed without using bedrails?: A Little Help needed moving from lying on your back to sitting on the side of a flat bed without using bedrails?: A Little Help needed moving to and  from a bed to a chair (including a wheelchair)?: A Little Help needed standing up from a chair using your arms (e.g., wheelchair or bedside chair)?: A Little Help needed to walk in hospital room?: A Little Help needed climbing 3-5 steps with a railing? : A Little 6 Click Score: 18    End of Session   Activity Tolerance: Patient tolerated treatment well;Patient limited by fatigue Patient left: in bed;with call bell/phone within reach;with bed alarm set;with family/visitor present Nurse Communication: Mobility status PT Visit Diagnosis: Other abnormalities of gait and mobility (R26.89);Muscle weakness (generalized) (M62.81)    Time: 8295-6213 PT Time Calculation (min) (ACUTE ONLY): 22 min   Charges:   PT Evaluation $PT Eval Low Complexity: 1 Low          Righteous Claiborne S, PT DPT Acute Rehabilitation Services Secure Chat Preferred  Office [phone]   Truddie Coco 08/20/2022, 4:38 PM

## 2022-08-20 NOTE — Hospital Course (Addendum)
76 y.o.f w/ history of hypothyroidism, hypertension, diabetes mellitus on insulin, hyperlipidemia, diabetes, neuropathy, GERD, CAD, gout, anemia, CVA, memory deficit presented after a fall. Patient reported feeling a weakness and heavy sensation and then falling at home.Daughter heard patient fall from the other room.Patient denies hitting her head, reported numbness and tingling in her legs.no LOC EMS was called found to have low blood sugar 65 given dextrose Patient denied chest pain fever chills diarrhea abdominal pain  In the ED  bp: 150s to 170s systolic.  Lab creat 1.19, calcium 8.6, AST 56. CBC stable UA-leukocytes and rare bacteria only.  Imaging workup included chest x-ray without acute abnormality and CT head without acute abnormality.   Patient received a liter of IV fluids and started on a rate of fluids in the ED and admitted for further management for possible fall/hypoglycemia and near syncope.

## 2022-08-20 NOTE — Progress Notes (Signed)
PROGRESS NOTE Shelly Blevins  ZOX:[id number] DOB: May 20, 1945 DOA: 08/19/2022 PCP: Jac Canavan, PA-C  Brief Narrative/Hospital Course: 77 y.o.f w/ history of hypothyroidism, hypertension, diabetes mellitus on insulin, hyperlipidemia, diabetes, neuropathy, GERD, CAD, gout, anemia, CVA, memory deficit presented after a fall. Patient reported feeling a weakness and heavy sensation and then falling at home.Daughter heard patient fall from the other room.Patient denies hitting her head, reported numbness and tingling in her legs.no LOC EMS was called found to have low blood sugar 65 given dextrose Patient denied chest pain fever chills diarrhea abdominal pain  In the ED  bp: 150s to 170s systolic.  Lab creat 1.19, calcium 8.6, AST 56. CBC stable UA-leukocytes and rare bacteria only.  Imaging workup included chest x-ray without acute abnormality and CT head without acute abnormality.   Patient received a liter of IV fluids and started on a rate of fluids in the ED and admitted for further management for possible fall/hypoglycemia and near syncope.     Subjective: Seen and examined this morning.  Daughter at the bedside blood sugar improved Patient complains of ongoing right upper extremity swelling pain and some pain on left ankle   Assessment and Plan: Principal Problem:   Hypoglycemia associated with diabetes (HCC) Active Problems:   GERD   Essential hypertension   Dyslipidemia   Coronary artery disease due to lipid rich plaque   Hypothyroidism   Memory change   Diabetes mellitus (HCC)   Diabetic neuropathy (HCC)   Anemia   Chronic gout without tophus   Fall   Near syncope    Near syncope Fall: Suspect due to patient's hypoglycemia, so far workup unremarkable monitor orthostatic vital signs, obtain PT OT.  EKG has prolonged Qtc- monitor.  DM on insulin W/ Hypoglycemia: Suspect likely due to patient's insulin use takes 7030 FlexPen 60 units twice daily.  Hold insulin for now and  monitor CBg allow p.o. will likely send her home with sliding scale insulin consulted DM coordinator.  Patient endorses she does not eat well-so her insulin regimen likely is higher than she needs. Recent Labs  Lab 08/19/22 1104 08/19/22 1526 08/19/22 1742 08/19/22 2027 08/20/22 0824  GLUCAP 70 51* 140* 145* 121*    Right hand wrist elbow and shoulder pain swelling x 1 wk seen at [facility] -xray done, worried abt steroid use due to her Dm per family, xray and blood worse were done there- was told it is from Rheumatoid arthritis-she will need to follow-up with rheumatology outpatient, added scheduled Tylenol 1000 mg, ibuprofen as needed PT OT evaluation.  Left ankle pain and numbness- now mostly pain Hx of gout: on allupurinol.  Hypothyroidism:Continue home Synthroid   Hypertension: stable on losartan, spironolactone.  Continue to hold amlodipine.   CAD-nonobstructing Hyperlipidemia: No chest pain continue aspirin and statin.   Neuropathy: cont gabapentin   Anemia, mild chronic stable hemoglobin, monitor   Memory issues:stable, cont home donepezil   GERD:Continue home Pepci  Class I Obesity:Patient's Body mass index is 33.84 kg/m. : Will benefit with PCP follow-up, weight loss  healthy lifestyle and outpatient sleep evaluation.   DVT prophylaxis: enoxaparin (LOVENOX) injection 40 mg Start: 08/19/22 2200 Code Status:   Code Status: Full Code Family Communication: plan of care discussed with patient at bedside. Patient status is:  admitted as observation but remains hospitalized for ongoing  because of hypoglycemia left ARM swelling pain Level of care: Telemetry Medical   Dispo: The patient is from: home  Anticipated disposition: home Objective: Vitals last 24 hrs: Vitals:   08/19/22 2023 08/19/22 2025 08/20/22 0516 08/20/22 0822  BP: (!) 170/70 (!) 170/70 (!) 150/65 (!) 154/56  Pulse: 74 74 (!) 59 60  Resp:  17 18 18   Temp:  99.4 F (37.4 C) 99.3 F (37.4  C) 99.3 F (37.4 C)  TempSrc:      SpO2: 100% 100% 99% 97%  Weight:      Height:       Weight change:   Physical Examination: General exam: alert awake, older than stated age HEENT:Oral mucosa moist, Ear/Nose WNL grossly Respiratory system: bilaterally clear BS, no use of accessory muscle Cardiovascular system: S1 & S2 +, No JVD. Gastrointestinal system: Abdomen soft,NT,ND, BS+ Nervous System:Alert, awake, moving extremities. Extremities: LE edema neg, rt wrist,fingers,elbow tender and swollen Skin: No rashes,no icterus. MSK: Normal muscle bulk,tone, power  Medications reviewed:  Scheduled Meds:  acetaminophen  1,000 mg Oral BID   allopurinol  100 mg Oral Daily   aspirin EC  81 mg Oral Daily   atorvastatin  80 mg Oral Daily   donepezil  10 mg Oral QHS   enoxaparin (LOVENOX) injection  40 mg Subcutaneous Q24H   famotidine  40 mg Oral BID   gabapentin  100 mg Oral QHS   insulin aspart  0-15 Units Subcutaneous TID WC   insulin aspart  0-5 Units Subcutaneous QHS   levothyroxine  50 mcg Oral Q0600   sodium chloride flush  3 mL Intravenous Q12H   Continuous Infusions:  sodium chloride 125 mL/hr at 08/19/22 1536    Diet Order             Diet heart healthy/carb modified Room service appropriate? Yes; Fluid consistency: Thin  Diet effective now                  Intake/Output Summary (Last 24 hours) at 08/20/2022 1124 Last data filed at 08/19/2022 1945 Gross per 24 hour  Intake 1120 ml  Output --  Net 1120 ml   Net IO Since Admission: 1,120 mL [08/20/22 1124]  Wt Readings from Last 3 Encounters:  08/19/22 83.9 kg  04/29/22 85.5 kg  02/23/22 86.5 kg     Unresulted Labs (From admission, onward)     Start     Ordered   08/26/22 0500  Creatinine, serum  (enoxaparin (LOVENOX)    CrCl >/= 30 ml/min)  Weekly,   R     Comments: while on enoxaparin therapy    08/19/22 1601          Data Reviewed: I have personally reviewed following labs and imaging  studies CBC: Recent Labs  Lab 08/19/22 1234 08/20/22 0627  WBC 10.4 10.0  NEUTROABS 7.3  --   HGB 11.5* 10.4*  HCT 36.3 32.2*  MCV 90.8 88.7  PLT 237 240   Basic Metabolic Panel: Recent Labs  Lab 08/19/22 1234 08/20/22 0627  NA 135 135  K 4.2 3.9  CL 99 100  CO2 24 25  GLUCOSE 97 127*  BUN 22 17  CREATININE 1.19* 1.13*  CALCIUM 8.6* 8.4*   GFR: Estimated Creatinine Clearance: 41.2 mL/min (A) (by C-G formula based on SCr of 1.13 mg/dL (H)). Liver Function Tests: Recent Labs  Lab 08/19/22 1234 08/20/22 0627  AST 56* 42*  ALT 27 26  ALKPHOS 70 62  BILITOT 0.8 0.6  PROT 8.0 7.2  ALBUMIN 4.0 3.4*   Recent Labs  Lab 08/19/22 1104 08/19/22 1526 08/19/22 1742  08/19/22 2027 08/20/22 0824  GLUCAP 70 51* 140* 145* 121*   Antimicrobials: Anti-infectives (From admission, onward)    None      Culture/Microbiology No results found for: "SDES", "SPECREQUEST", "CULT", "REPTSTATUS"  Radiology Studies: CT HEAD WO CONTRAST  Result Date: 08/19/2022 CLINICAL DATA:  Provided history: Mental status change, unknown cause. EXAM: CT HEAD WITHOUT CONTRAST TECHNIQUE: Contiguous axial images were obtained from the base of the skull through the vertex without intravenous contrast. RADIATION DOSE REDUCTION: This exam was performed according to the departmental dose-optimization program which includes automated exposure control, adjustment of the mA and/or kV according to patient size and/or use of iterative reconstruction technique. COMPARISON:  Brain MRI 01/20/2017 (images available, report unavailable). Head CT 06/18/2006. FINDINGS: Brain: Mild generalized parenchymal atrophy. Patchy and ill-defined hypoattenuation within the cerebral white matter, nonspecific but compatible with mild chronic small vessel ischemic disease. There is no acute intracranial hemorrhage. No demarcated cortical infarct. No extra-axial fluid collection. No evidence of an intracranial mass. No midline shift.  Vascular: No hyperdense vessel.  Atherosclerotic calcifications. Skull: No fracture or aggressive osseous lesion. Sinuses/Orbits: Bilateral proptosis. No significant paranasal sinus disease at the imaged levels. IMPRESSION: 1.  No evidence of an acute intracranial abnormality. 2. Mild chronic small vessel ischemic changes within the cerebral white matter. 3. Mild generalized parenchymal atrophy. 4. Bilateral proptosis. Electronically Signed   By: Jackey Loge D.O.   On: 08/19/2022 11:59   DG Chest Port 1 View  Result Date: 08/19/2022 CLINICAL DATA:  ALOC EXAM: PORTABLE CHEST 1 VIEW COMPARISON:  CXR 04/16/17 FINDINGS: No pleural effusion. No pneumothorax. No focal airspace opacity. No radiographically apparent displaced rib fractures. Visualized upper abdomen is unremarkable. Normal cardiac and mediastinal contours. IMPRESSION: No focal airspace opacity. Electronically Signed   By: Lorenza Cambridge M.D.   On: 08/19/2022 11:39     LOS: 0 days   Lanae Boast, MD [facility]  08/20/2022, 11:24 AM   1

## 2022-08-20 NOTE — Evaluation (Signed)
Occupational Therapy Evaluation Patient Details Name: Shelly Blevins MRN: [id number] DOB: 1945-08-06 Today's Date: 08/20/2022   History of Present Illness Pt is a 77 y/o female presenting 5/22 after fall.  PMH includes: HTN, DM, neuropathy, CAD, gout, anemia, CVA, memory deficit.   Clinical Impression   PTA patient reports independent with ADLs, mobility and light Adls, recently needing a little more assist due to R hand and L leg pain.  Admitted for above and limited by problem list below. VSS throughout session, including orthostatics.  Completed bed mobility with supervision, transfers with min assist fading to min guard assist, functional mobility in room with min guard and ADLs with up to min assist.  Educated on AROM to R hand/UE as tolerated, keeping UE elevated to decrease pain and edema.  She reports having some assist at home, but not 24/7.  Based on performance today, believe she will best benefit from continued OT services acutely to optimize independence and safety with ADLs/mobility with plan to follow up with outpatient OT services at dc.      Recommendations for follow up therapy are one component of a multi-disciplinary discharge planning process, led by the attending physician.  Recommendations may be updated based on patient status, additional functional criteria and insurance authorization.   Assistance Recommended at Discharge Intermittent Supervision/Assistance  Patient can return home with the following A little help with walking and/or transfers;A little help with bathing/dressing/bathroom;Assistance with cooking/housework;Assist for transportation;Help with stairs or ramp for entrance    Functional Status Assessment  Patient has had a recent decline in their functional status and demonstrates the ability to make significant improvements in function in a reasonable and predictable amount of time.  Equipment Recommendations  None recommended by OT    Recommendations for  Other Services PT consult     Precautions / Restrictions Precautions Precautions: Fall Restrictions Weight Bearing Restrictions: No      Mobility Bed Mobility Overal bed mobility: Needs Assistance Bed Mobility: Supine to Sit, Sit to Supine     Supine to sit: Supervision Sit to supine: Supervision   General bed mobility comments: HOB elevated but no assist required    Transfers Overall transfer level: Needs assistance Equipment used: 1 person hand held assist Transfers: Sit to/from Stand Sit to Stand: Min assist           General transfer comment: min assist initally fading to min guard      Balance Overall balance assessment: Needs assistance Sitting-balance support: No upper extremity supported, Feet supported Sitting balance-Leahy Scale: Fair     Standing balance support: Single extremity supported, During functional activity Standing balance-Leahy Scale: Fair Standing balance comment: preference to 1 UE support, reaching out for UE support                           ADL either performed or assessed with clinical judgement   ADL Overall ADL's : Needs assistance/impaired     Grooming: Minimal assistance;Standing           Upper Body Dressing : Minimal assistance;Sitting   Lower Body Dressing: Minimal assistance;Sit to/from stand   Toilet Transfer: Ambulation;Minimal assistance;Min Pension scheme manager Details (indicate cue type and reason): simulated in room         Functional mobility during ADLs: Min guard General ADL Comments: pt reaching out for UE support     Vision   Vision Assessment?: No apparent visual deficits     Perception  Praxis      Pertinent Vitals/Pain Pain Assessment Pain Assessment: Faces Faces Pain Scale: Hurts even more Pain Location: R hand, L leg Pain Descriptors / Indicators: Discomfort, Tender, Sharp, Pins and needles Pain Intervention(s): Limited activity within patient's tolerance, Monitored  during session, Repositioned     Hand Dominance Left   Extremity/Trunk Assessment Upper Extremity Assessment Upper Extremity Assessment: RUE deficits/detail;Generalized weakness RUE Deficits / Details: R hand edema and pain with movement in UE.  limited assessment but encouraged elevation and ROM as tolerated  (per [facility] pt reports RA, and has wrist splint to help- she removed bc it was causing more pain) RUE: Unable to fully assess due to pain RUE Sensation: WNL RUE Coordination: decreased fine motor;decreased gross motor   Lower Extremity Assessment Lower Extremity Assessment: Defer to PT evaluation       Communication Communication Communication: No difficulties   Cognition Arousal/Alertness: Awake/alert Behavior During Therapy: WFL for tasks assessed/performed Overall Cognitive Status: History of cognitive impairments - at baseline                                 General Comments: per chart hx of memory deficit, but appears WFL. able to follow commands and engage appropriately     General Comments  family at side and supportive    Exercises     Shoulder Instructions      Home Living Family/patient expects to be discharged to:: Private residence Living Arrangements: Children Available Help at Discharge: Family;Available PRN/intermittently Type of Home: House Home Access: Ramped entrance     Home Layout: One level     Bathroom Shower/Tub: Producer, television/film/video: Standard     Home Equipment: Shower seat - built in;Grab bars - tub/shower   Additional Comments: alone at 3pm and sometimes in the evening      Prior Functioning/Environment Prior Level of Function : Independent/Modified Independent;History of Falls (last six months)             Mobility Comments: no AD ADLs Comments: reports some assist with IADLs but managing ADls without assist.  intermittent since R hand pain        OT Problem List: Decreased  strength;Decreased activity tolerance;Impaired balance (sitting and/or standing);Pain;Increased edema;Impaired UE functional use;Obesity;Decreased knowledge of precautions;Decreased knowledge of use of DME or AE;Decreased coordination      OT Treatment/Interventions: Self-care/ADL training;Therapeutic exercise;DME and/or AE instruction;Therapeutic activities;Patient/family education;Balance training    OT Goals(Current goals can be found in the care plan section) Acute Rehab OT Goals Patient Stated Goal: less pain OT Goal Formulation: With patient Time For Goal Achievement: 09/03/22 Potential to Achieve Goals: Good  OT Frequency: Min 2X/week    Co-evaluation              AM-PAC OT "6 Clicks" Daily Activity     Outcome Measure Help from another person eating meals?: A Little Help from another person taking care of personal grooming?: A Little Help from another person toileting, which includes using toliet, bedpan, or urinal?: A Little Help from another person bathing (including washing, rinsing, drying)?: A Little Help from another person to put on and taking off regular upper body clothing?: A Little Help from another person to put on and taking off regular lower body clothing?: A Little 6 Click Score: 18   End of Session Nurse Communication: Mobility status  Activity Tolerance: Patient tolerated treatment well Patient left: in bed;with  call bell/phone within reach;with bed alarm set;with family/visitor present  OT Visit Diagnosis: Other abnormalities of gait and mobility (R26.89);Muscle weakness (generalized) (M62.81);Pain;History of falling (Z91.81) Pain - Right/Left: Right Pain - part of body: Hand;Arm                Time: 1610-9604 OT Time Calculation (min): 35 min Charges:  OT General Charges $OT Visit: 1 Visit OT Evaluation $OT Eval Moderate Complexity: 1 Mod OT Treatments $Self Care/Home Management : 8-22 mins  Barry Brunner, OT Acute Rehabilitation  Services Office [phone]   Chancy Milroy 08/20/2022, 12:40 PM

## 2022-08-20 NOTE — TOC Initial Note (Signed)
Transition of Care [facility]) - Initial/Assessment Note    Patient Details  Name: Shelly Blevins MRN: [id number] Date of Birth: 02/05/1946  Transition [facility] CM/SW Contact:    Janae Bridgeman, RN Phone Number: 08/20/2022, 2:00 PM  Clinical Narrative:                 Cm met with the patient at the bedside to discuss TOC needs.  The patient lives with her grandson in the home.  The patient states that she fell at home on the carpet after her "let gave out on her due to her arthritis".  OT met with the patient this morning.  Outpatient therapy was recommended and the patient was agreeable to Outpatient therapy referral.  Referral was placed to the Main OUtpatient center.  The patient's granddaughter is asleep at the bedside at this time.  CM will continue to follow the patient for TOC needs.  Expected Discharge Plan: OP Rehab Barriers to Discharge: Continued Medical Work up   Patient Goals and CMS Choice Patient states their goals for this hospitalization and ongoing recovery are:: To return home CMS Medicare.gov Compare Post Acute Care list provided to:: Patient Choice offered to / list presented to : Patient [place] ownership interest in [facility].provided to:: Patient    Expected Discharge Plan and Services   Discharge Planning Services: CM Consult Post Acute Care Choice: Resumption of Svcs/PTA Provider (OP OT recommended - will place referral) Living arrangements for the past 2 months: Single Family Home                                      Prior Living Arrangements/Services Living arrangements for the past 2 months: Single Family Home Lives with:: Adult Children (Patient lives with grandson) Patient language and need for interpreter reviewed:: Yes Do you feel safe going back to the place where you live?: Yes      Need for Family Participation in Patient Care: Yes (Comment) Care giver support system in place?: Yes (comment) Current home  services: DME (Shower seat, grab bars, glucometer) Criminal Activity/Legal Involvement Pertinent to Current Situation/Hospitalization: No - Comment as needed  Activities of Daily Living Home Assistive Devices/Equipment: None ADL Screening (condition at time of admission) Patient's cognitive ability adequate to safely complete daily activities?: Yes Is the patient deaf or have difficulty hearing?: No Does the patient have difficulty seeing, even when wearing glasses/contacts?: No Does the patient have difficulty concentrating, remembering, or making decisions?: No Patient able to express need for assistance with ADLs?: Yes Does the patient have difficulty dressing or bathing?: No Independently performs ADLs?: Yes (appropriate for developmental age) Does the patient have difficulty walking or climbing stairs?: No Weakness of Legs: None Weakness of Arms/Hands: Left  Permission Sought/Granted Permission sought to share information with : Case Manager, Family Supports Permission granted to share information with : Yes, Verbal Permission Granted     Permission granted to share info w AGENCY: Referral for OUtpatient OT at [facility] Main OUtpatient rehabilitation  Permission granted to share info w Relationship: granddaughter present in the hospital room     Emotional Assessment Appearance:: Appears stated age Attitude/Demeanor/Rapport: Gracious Affect (typically observed): Accepting Orientation: : Oriented to Self, Oriented to Place, Oriented to  Time, Oriented to Situation Alcohol / Substance Use: Not Applicable Psych Involvement: No (comment)  Admission diagnosis:  Syncope and collapse [R55] Hypoglycemia [E16.2] Fall, initial encounter [  W19.XXXA] Patient Active Problem List   Diagnosis Date Noted   Fall 08/19/2022   Hypoglycemia associated with diabetes (HCC) 08/19/2022   Near syncope 08/19/2022   Elevated uric acid in blood 05/12/2021   Chronic gout without tophus 05/12/2021   High risk  medication use 05/12/2021   Anemia 01/23/2021   Diabetic neuropathy (HCC) 01/15/2021   Long term (current) use of insulin (HCC) 01/15/2021   Diabetes mellitus (HCC) 12/31/2020   Hypertrophy of nail 12/31/2020   Pain in joints of both feet 12/31/2020   Abnormal ankle brachial index (ABI) 12/31/2020   Encounter for health maintenance examination in adult 04/25/2020   Medicare annual wellness visit, subsequent 04/25/2020   Memory change 04/25/2020   Post-menopausal 04/25/2020   Constipation 02/29/2020   Osteoarthritis of both knees 01/10/2020   Chronic pain of both knees 03/28/2019   Vitamin D deficiency 03/28/2019   Ataxia 11/02/2016   Estrogen deficiency 11/02/2016   Vaccine counseling 10/09/2015   Advanced directives, counseling/discussion 10/09/2015   Need for prophylactic vaccination against Streptococcus pneumoniae (pneumococcus) 10/09/2015   Hypothyroidism 04/17/2015   Essential hypertension 06/06/2013   Dyslipidemia 06/06/2013   Coronary artery disease due to lipid rich plaque 06/06/2013   GERD 10/29/2008   PCP:  Jac Canavan, PA-C Pharmacy:   [facility] [id number] [place], [place] - [address] AT [facility] [address] [place] [address] Phone: [phone] Fax: [phone]  [facility] Delivery - [place], [place] - [address] [place] [address] Phone: [phone] Fax: [phone]  [facility] - [place], [place] - [address] [place] [address] Phone: [phone] Fax: [phone]  [facility] - [place], [place] - [address] [place] [address] Phone: [phone] Fax: [phone]     Social Determinants of Health (SDOH) Social History: SDOH Screenings   Food Insecurity: No Food Insecurity (08/19/2022)  Housing: Low Risk  (08/19/2022)  Transportation Needs: No  Transportation Needs (08/19/2022)  Utilities: Not At Risk (08/19/2022)  Depression (PHQ2-9): Low Risk  (04/29/2022)  Financial Resource Strain: Low Risk  (09/19/2020)  Tobacco Use: Medium Risk (08/19/2022)   SDOH Interventions:     Readmission Risk Interventions     No data to display

## 2022-08-21 ENCOUNTER — Other Ambulatory Visit (HOSPITAL_COMMUNITY): Payer: Self-pay

## 2022-08-21 DIAGNOSIS — E11649 Type 2 diabetes mellitus with hypoglycemia without coma: Secondary | ICD-10-CM | POA: Diagnosis not present

## 2022-08-21 LAB — GLUCOSE, CAPILLARY
Glucose-Capillary: 132 mg/dL — ABNORMAL HIGH (ref 70–99)
Glucose-Capillary: 147 mg/dL — ABNORMAL HIGH (ref 70–99)

## 2022-08-21 MED ORDER — NOVOLOG MIX 70/30 FLEXPEN (70-30) 100 UNIT/ML ~~LOC~~ SUPN: 5.0000 [IU] | PEN_INJECTOR | Freq: Two times a day (BID) | SUBCUTANEOUS | 3 refills | Status: DC

## 2022-08-21 MED ORDER — INSULIN ASPART 100 UNIT/ML FLEXPEN: 0.0000 [IU] | PEN_INJECTOR | Freq: Three times a day (TID) | SUBCUTANEOUS | 11 refills | Status: DC

## 2022-08-21 NOTE — Discharge Summary (Signed)
Physician Discharge Summary   Patient: Shelly Blevins MRN: [id number] DOB: [birth year]  Admit date:     08/19/2022  Discharge date: 08/21/2022  Discharge Physician: Pennie Banter   PCP: Jac Canavan, PA-C   Recommendations at discharge:    Follow up with Primary Care and/or insulin prescriber within 1-2 weeks Follow up on insulin requirements.  Insulin regimen was reduced significantly due to recurrent hypoglycemia and poor PO intake Repeat CBC, BMP in 1-2 weeks  Discharge Diagnoses: Principal Problem:   Hypoglycemia associated with diabetes (HCC) Active Problems:   GERD   Essential hypertension   Dyslipidemia   Coronary artery disease due to lipid rich plaque   Hypothyroidism   Memory change   Diabetes mellitus (HCC)   Diabetic neuropathy (HCC)   Anemia   Chronic gout without tophus   Fall   Near syncope  Resolved Problems:   * No resolved hospital problems. [facility] Course: 77 y.o.f w/ history of hypothyroidism, hypertension, diabetes mellitus on insulin, hyperlipidemia, diabetes, neuropathy, GERD, CAD, gout, anemia, CVA, memory deficit presented after a fall. 77 Patient reported feeling a weakness and heavy sensation and then falling at home.Daughter heard patient fall from the other room.Patient denies hitting her head, reported numbness and tingling in her legs.no LOC EMS was called found to have low blood sugar 65 given dextrose Patient denied chest pain fever chills diarrhea abdominal pain  In the ED  77 bp: 150s to 170s systolic.  Lab creat 1.19, calcium 8.6, AST 56. CBC stable UA-leukocytes and rare bacteria only.  Imaging workup included chest x-ray without acute abnormality and CT head without acute abnormality.   Patient received a liter of IV fluids and started on a rate of fluids in the ED and admitted for further management for possible fall/hypoglycemia and near syncope.   Further hospital course and management as outlined below.  5/24 -- pt feels  well, sugars stable without hypoglycemia since yesterday afternoon, all in 100's today.  Now on reduced dose 70/30 insulin, just 5 units BID WC.  Seen by diabetes coordinator and a Freestyle Libre CGM was placed. Daughter was at bedside.  She pointed out recent medication changes and pt getting med here that was changed.  Pharmacy reviewed and updated her home med list for discharge med reconciliation.  Pt medically stable and agreeable to d/c home today.   Assessment and Plan:  Near syncope Fall: Suspect due to hypoglycemia Workup unremarkable  Normal orthostatic vitals Evaluated by PT/OT with no rehab needs. EKG has prolonged Qtc- monitor.   DM on insulin W/ Hypoglycemia: Suspect due to patient's insulin use, was on 70/30 Novolog 60 units twice daily.   Insulin was held initially, then resumed at drastically reduced dose of just 5 units 70/30 BID, with 0-6 units sliding scale regular Novolog. --Seen by diabetes coordinator --Discharge on Novolog Mix 70/30 5 units BID WC plus very sensisitive sliding scale 0-6 units.  Pt and daughter comfortable with using sliding scale --Freestyle Libre CGM placed today --Advise very close outpatient follow up with PCP, insulin prescriber   Right hand wrist elbow and shoulder pain swelling x 1 wk Seen at [facility] -xray done, worried abt steroid use due to her Dm per family, xray and blood worse were done there- was told it is from Rheumatoid arthritis-she will need to follow-up with rheumatology outpatient Added scheduled Tylenol 1000 mg, ibuprofen as needed  PT OT evaluations - need rehab needs.   Left ankle pain and numbness- now  mostly pain Hx of gout: on allupurinol.   Hypothyroidism:Continue home Synthroid   Hypertension: stable on losartan, spironolactone, amlodipine.   CAD-nonobstructing Hyperlipidemia: No chest pain continue aspirin and statin.   Neuropathy: cont Recently gabapentin stopped and Cymbalta started - continue.    Anemia, mild chronic stable hemoglobin, monitor    Memory issues:stable, cont home donepezil   GERD:Continue home Pepci   Class I Obesity:Patient's Body mass index is 33.84 kg/m. : Will benefit with PCP follow-up, weight loss  healthy lifestyle and outpatient sleep evaluation.       Consultants: None Procedures performed: None Disposition: Home Diet recommendation:  Discharge Diet Orders (From admission, onward)     Start     Ordered   08/21/22 0000  Diet - low sodium heart healthy        08/21/22 1530           Carb modified diet DISCHARGE MEDICATION: Allergies as of 08/21/2022   No Known Allergies      Medication List     STOP taking these medications    ezetimibe 10 MG tablet Commonly known as: ZETIA       TAKE these medications    acetaminophen 500 MG tablet Commonly known as: TYLENOL Take 1,000 mg by mouth every 6 (six) hours as needed for mild pain or moderate pain.   albuterol 108 (90 Base) MCG/ACT inhaler Commonly known as: VENTOLIN HFA Inhale 2 puffs into the lungs every 6 (six) hours as needed for wheezing or shortness of breath.   allopurinol 100 MG tablet Commonly known as: ZYLOPRIM Take 1 Tablet by mouth once daily   amLODipine 10 MG tablet Commonly known as: NORVASC Take 1 tablet (10 mg total) by mouth daily.   aspirin EC 81 MG tablet Take 1 tablet (81 mg total) by mouth daily.   atorvastatin 80 MG tablet Commonly known as: LIPITOR Take 1 tablet (80 mg total) by mouth daily. What changed: when to take this   BD Pen Needle Nano 2nd Gen 32G X 4 MM Misc Generic drug: Insulin Pen Needle 2 (two) times daily. as directed   donepezil 10 MG tablet Commonly known as: ARICEPT Take 1 tablet (10 mg total) by mouth at bedtime. Helps slow down memory loss. What changed:  when to take this additional instructions   DULoxetine 30 MG capsule Commonly known as: CYMBALTA Take 30 mg by mouth daily.   famotidine 40 MG tablet Commonly  known as: PEPCID Take 40 mg by mouth 2 (two) times daily.   Gvoke HypoPen 2-Pack 1 MG/0.2ML Soaj Generic drug: Glucagon Inject 1 each into the skin as needed. What changed: reasons to take this   insulin aspart 100 UNIT/ML FlexPen Commonly known as: NOVOLOG Inject 0-6 Units into the skin 3 (three) times daily with meals. Use Sliding Scale per instructions provided.   Jardiance 10 MG Tabs tablet Generic drug: empagliflozin Take 10 mg by mouth daily.   latanoprost 0.005 % ophthalmic solution Commonly known as: XALATAN Place 1 drop into both eyes at bedtime.   levothyroxine 50 MCG tablet Commonly known as: SYNTHROID Take 1 Tablet by mouth once daily   losartan 50 MG tablet Commonly known as: COZAAR Take 50 mg by mouth daily.   metFORMIN 500 MG tablet Commonly known as: GLUCOPHAGE Take 1,000 mg by mouth 2 (two) times daily. What changed: Another medication with the same name was removed. Continue taking this medication, and follow the directions you see here.   NovoLOG Mix 70/30 FlexPen (  70-30) 100 UNIT/ML FlexPen Generic drug: insulin aspart protamine - aspart Inject 5 Units into the skin 2 (two) times daily with a meal. 60 units in the morning with breakfast, 10 units with lunch, 60 units in the evening with dinner. What changed:  how much to take when to take this   spironolactone 25 MG tablet Commonly known as: ALDACTONE Take 0.5 tablets (12.5 mg total) by mouth daily.        Follow-up Information     Tysinger, Kermit Balo, PA-C. Schedule an appointment as soon as possible for a visit.   Specialty: Family Medicine Why: Call the office and schedule a hospital follow up in the next 7-10 days. Contact information: [address] [place] [address] [phone]         [facility] at [facility]. Call.   Specialty: Rehabilitation Why: Please call the OUtpatient therapy center and schedule Outpatient follow up for  Occupational Therapy follow up that was recommended. Contact information: [address] [id number] mc [place] [address] [phone]               Discharge Exam: Filed Weights   08/19/22 1023  Weight: 83.9 kg   General exam: awake, alert, no acute distress HEENT: atraumatic, clear conjunctiva, anicteric sclera, moist mucus membranes, hearing grossly normal  Respiratory system: CTAB, no wheezes, rales or rhonchi, normal respiratory effort. Cardiovascular system: normal S1/S2,  RRR, no JVD, murmurs, rubs, gallops,  no pedal edema.   Gastrointestinal system: soft, NT, ND, no HSM felt, +bowel sounds. Central nervous system: A&O x 4. no gross focal neurologic deficits, normal speech Extremities: moves all , no edema, normal tone Skin: dry, intact, normal temperature, normal color, No rashes, lesions or ulcers Psychiatry: normal mood, flat affect, judgement and insight appear normal   Condition at discharge: stable  The results of significant diagnostics from this hospitalization (including imaging, microbiology, ancillary and laboratory) are listed below for reference.   Imaging Studies: CT HEAD WO CONTRAST  Result Date: 08/19/2022 CLINICAL DATA:  Provided history: Mental status change, unknown cause. EXAM: CT HEAD WITHOUT CONTRAST TECHNIQUE: Contiguous axial images were obtained from the base of the skull through the vertex without intravenous contrast. RADIATION DOSE REDUCTION: This exam was performed according to the departmental dose-optimization program which includes automated exposure control, adjustment of the mA and/or kV according to patient size and/or use of iterative reconstruction technique. COMPARISON:  Brain MRI 01/20/2017 (images available, report unavailable). Head CT 06/18/2006. FINDINGS: Brain: Mild generalized parenchymal atrophy. Patchy and ill-defined hypoattenuation within the cerebral white matter, nonspecific but compatible with  mild chronic small vessel ischemic disease. There is no acute intracranial hemorrhage. No demarcated cortical infarct. No extra-axial fluid collection. No evidence of an intracranial mass. No midline shift. Vascular: No hyperdense vessel.  Atherosclerotic calcifications. Skull: No fracture or aggressive osseous lesion. Sinuses/Orbits: Bilateral proptosis. No significant paranasal sinus disease at the imaged levels. IMPRESSION: 1.  No evidence of an acute intracranial abnormality. 2. Mild chronic small vessel ischemic changes within the cerebral white matter. 3. Mild generalized parenchymal atrophy. 4. Bilateral proptosis. Electronically Signed   By: Jackey Loge D.O.   On: 08/19/2022 11:59   DG Chest Port 1 View  Result Date: 08/19/2022 CLINICAL DATA:  ALOC EXAM: PORTABLE CHEST 1 VIEW COMPARISON:  CXR 04/16/17 FINDINGS: No pleural effusion. No pneumothorax. No focal airspace opacity. No radiographically apparent displaced rib fractures. Visualized upper abdomen is unremarkable. Normal cardiac and mediastinal contours. IMPRESSION: No focal airspace  opacity. Electronically Signed   By: Lorenza Cambridge M.D.   On: 08/19/2022 11:39    Microbiology: Results for orders placed or performed in visit on 05/12/21  Urine Culture     Status: Abnormal   Collection Time: 05/12/21 10:22 AM   Specimen: Urine   UR  Result Value Ref Range Status   Urine Culture, Routine Final report (A)  Final   Organism ID, Bacteria Comment (A)  Final    Comment: Beta hemolytic Streptococcus, group B 50,000-100,000 colony forming units per mL Penicillin and ampicillin are drugs of choice for treatment of beta-hemolytic streptococcal infections. Susceptibility testing of penicillins and other beta-lactam agents approved by the FDA for treatment of beta-hemolytic streptococcal infections need not be performed routinely because nonsusceptible isolates are extremely rare in any beta-hemolytic streptococcus and have not been  reported for Streptococcus pyogenes (group A). (CLSI)     Labs: CBC: Recent Labs  Lab 08/19/22 1234 08/20/22 0627  WBC 10.4 10.0  NEUTROABS 7.3  --   HGB 11.5* 10.4*  HCT 36.3 32.2*  MCV 90.8 88.7  PLT 237 240   Basic Metabolic Panel: Recent Labs  Lab 08/19/22 1234 08/20/22 0627  NA 135 135  K 4.2 3.9  CL 99 100  CO2 24 25  GLUCOSE 97 127*  BUN 22 17  CREATININE 1.19* 1.13*  CALCIUM 8.6* 8.4*   Liver Function Tests: Recent Labs  Lab 08/19/22 1234 08/20/22 0627  AST 56* 42*  ALT 27 26  ALKPHOS 70 62  BILITOT 0.8 0.6  PROT 8.0 7.2  ALBUMIN 4.0 3.4*   CBG: Recent Labs  Lab 08/20/22 1300 08/20/22 1713 08/20/22 1953 08/21/22 0731 08/21/22 1145  GLUCAP 144* 202* 172* 132* 147*    Discharge time spent: greater than 30 minutes.  Signed: Pennie Banter, DO [facility] 08/21/2022

## 2022-08-21 NOTE — Inpatient Diabetes Management (Signed)
Inpatient Diabetes Program Recommendations  AACE/ADA: New Consensus Statement on Inpatient Glycemic Control (2015)  Target Ranges:  Prepandial:   less than 140 mg/dL      Peak postprandial:   less than 180 mg/dL (1-2 hours)      Critically ill patients:  140 - 180 mg/dL   Lab Results  Component Value Date   GLUCAP 147 (H) 08/21/2022   HGBA1C 7.5 (H) 04/25/2020    Review of Glycemic Control  Latest Reference Range & Units 08/20/22 19:53 08/21/22 07:31 08/21/22 11:45  Glucose-Capillary 70 - 99 mg/dL 161 (H) 096 (H) 045 (H)  (H): Data is abnormally high Diabetes history: DM2 Outpatient Diabetes medications: Novolog 70/30 insulin 60 units bid, Metformin 500 mg bid Current orders for Inpatient glycemic control: Novolog 0-15 units tid correction, 0-5 units hs   Inpatient Diabetes Program Recommendations:    Recommend at discharge decreasing doses of Novolog 70/30 to 8 units BID. Secure chat sent to MD to ensure dose reduction.   Spoke with patient and granddaughter again to reinforce importance of being mindful of low events and when to reach out to PCP.  Applied Freestyle libre 3 on patient's right upper arm. Application downloaded and reviewed, how to obtain additional sensors and cost for patient from pharmacy. Patient to follow up with PCP and has no additional questions at this time.   Thanks, Lujean Rave, MSN, RNC-OB Diabetes Coordinator [phone] (8a-5p)

## 2022-08-21 NOTE — TOC Benefit Eligibility Note (Signed)
Patient Product/process development scientist completed.    The patient is currently admitted and upon discharge could be taking [employer].  The current 30 day co-pay is $25.65.   The patient is currently admitted and upon discharge could be taking Dexcom G7 Sensors.  The current 30 day co-pay is $68.80.   The patient is insured through [employer] Part d   This test claim was processed through [facility]- copay amounts may vary at other pharmacies due to pharmacy/plan contracts, or as the patient moves through the different stages of their insurance plan.  Roland Earl, CPHT Pharmacy Patient Advocate Specialist [facility] Patient Advocate Team Direct Number: [phone]  Fax: [phone]

## 2022-08-21 NOTE — Progress Notes (Addendum)
Occupational Therapy Treatment Patient Details Name: Shelly Blevins MRN: [id number] DOB: 12-01-1945 Today's Date: 08/21/2022   History of present illness Pt is a 77 y/o female presenting 5/22 after fall.  PMH includes: HTN, DM, neuropathy, CAD, gout, anemia, CVA, memory deficit.   OT comments  Patients R hand/wrist pain significantly reduced, pt reports keeping UE elevated and moving hand as educated on yesterday.  Reinforced recommendations during session.  Pt requires min assist for socks but will have assist at home, she is able to manage toileting needs and grooming with supervision.  She completes transfers and mobility in room with supervision.  Provided blue foam for R hand exercises and ROM.  Educated on activity modifications for joint protection. Continue to recommend Outpatient OT at dc.    Recommendations for follow up therapy are one component of a multi-disciplinary discharge planning process, led by the attending physician.  Recommendations may be updated based on patient status, additional functional criteria and insurance authorization.    Assistance Recommended at Discharge Intermittent Supervision/Assistance  Patient can return home with the following  A little help with walking and/or transfers;A little help with bathing/dressing/bathroom;Assistance with cooking/housework;Assist for transportation;Help with stairs or ramp for entrance   Equipment Recommendations  None recommended by OT    Recommendations for Other Services PT consult    Precautions / Restrictions Precautions Precautions: Fall Restrictions Weight Bearing Restrictions: No       Mobility Bed Mobility Overal bed mobility: Modified Independent             General bed mobility comments: no assist required    Transfers Overall transfer level: Needs assistance Equipment used: None Transfers: Sit to/from Stand Sit to Stand: Supervision                 Balance Overall balance  assessment: Needs assistance, History of Falls Sitting-balance support: No upper extremity supported, Feet supported Sitting balance-Leahy Scale: Fair     Standing balance support: No upper extremity supported, During functional activity Standing balance-Leahy Scale: Fair                             ADL either performed or assessed with clinical judgement   ADL Overall ADL's : Needs assistance/impaired     Grooming: Supervision/safety;Standing           Upper Body Dressing : Supervision/safety;Sitting   Lower Body Dressing: Minimal assistance;Sit to/from stand Lower Body Dressing Details (indicate cue type and reason): reports granddaughter will assist with socks, educated on compensatory techniques Toilet Transfer: Ambulation;Supervision/safety   Toileting- Clothing Manipulation and Hygiene: Supervision/safety;Sit to/from stand       Functional mobility during ADLs: Supervision/safety      Extremity/Trunk Assessment Upper Extremity Assessment Upper Extremity Assessment: RUE deficits/detail RUE Deficits / Details: R hand edema significantly reduced and paid decreased, remains edemaous at wrist and MCPs but tolerable., remains limited at shoudler flexion to 90. encouraged continued elevation and ROM RUE Coordination: decreased fine motor;decreased gross motor            Vision       Perception     Praxis      Cognition Arousal/Alertness: Awake/alert Behavior During Therapy: WFL for tasks assessed/performed Overall Cognitive Status: History of cognitive impairments - at baseline  General Comments: per chart hx of memory deficit, pt endorsing "my memory isn't what it used to be". Pleasant, good sense of humor        Exercises      Shoulder Instructions       General Comments      Pertinent Vitals/ Pain       Pain Assessment Pain Assessment: Faces Faces Pain Scale: Hurts a little bit Pain  Location: R hand, L leg Pain Descriptors / Indicators: Discomfort, Tender, Sore Pain Intervention(s): Limited activity within patient's tolerance, Monitored during session, Repositioned  Home Living                                          Prior Functioning/Environment              Frequency  Min 2X/week        Progress Toward Goals  OT Goals(current goals can now be found in the care plan section)  Progress towards OT goals: Progressing toward goals  Acute Rehab OT Goals Patient Stated Goal: home OT Goal Formulation: With patient Time For Goal Achievement: 09/03/22 Potential to Achieve Goals: Good  Plan Discharge plan remains appropriate;Frequency remains appropriate    Co-evaluation                 AM-PAC OT "6 Clicks" Daily Activity     Outcome Measure   Help from another person eating meals?: A Little Help from another person taking care of personal grooming?: A Little Help from another person toileting, which includes using toliet, bedpan, or urinal?: A Little Help from another person bathing (including washing, rinsing, drying)?: A Little Help from another person to put on and taking off regular upper body clothing?: A Little Help from another person to put on and taking off regular lower body clothing?: A Little 6 Click Score: 18    End of Session    OT Visit Diagnosis: Other abnormalities of gait and mobility (R26.89);Muscle weakness (generalized) (M62.81);Pain;History of falling (Z91.81) Pain - Right/Left: Right Pain - part of body: Hand;Arm   Activity Tolerance Patient tolerated treatment well   Patient Left in bed;with call bell/phone within reach   Nurse Communication Mobility status        Time: 1610-9604 OT Time Calculation (min): 22 min  Charges: OT General Charges $OT Visit: 1 Visit OT Treatments $Self Care/Home Management : 8-22 mins  Barry Brunner, OT Acute Rehabilitation Services Office  [phone]   Chancy Milroy 08/21/2022, 12:30 PM

## 2022-08-25 ENCOUNTER — Telehealth: Payer: Self-pay | Admitting: Internal Medicine

## 2022-08-25 DIAGNOSIS — I10 Essential (primary) hypertension: Secondary | ICD-10-CM | POA: Diagnosis not present

## 2022-08-25 DIAGNOSIS — E119 Type 2 diabetes mellitus without complications: Secondary | ICD-10-CM | POA: Diagnosis not present

## 2022-08-25 DIAGNOSIS — M2550 Pain in unspecified joint: Secondary | ICD-10-CM | POA: Diagnosis not present

## 2022-08-25 DIAGNOSIS — E1140 Type 2 diabetes mellitus with diabetic neuropathy, unspecified: Secondary | ICD-10-CM | POA: Diagnosis not present

## 2022-08-25 DIAGNOSIS — M109 Gout, unspecified: Secondary | ICD-10-CM | POA: Diagnosis not present

## 2022-08-25 DIAGNOSIS — E7800 Pure hypercholesterolemia, unspecified: Secondary | ICD-10-CM | POA: Diagnosis not present

## 2022-08-25 DIAGNOSIS — M1990 Unspecified osteoarthritis, unspecified site: Secondary | ICD-10-CM | POA: Diagnosis not present

## 2022-08-25 NOTE — Transitions of Care (Post Inpatient/ED Visit) (Unsigned)
   08/25/2022  Name: Shelly Blevins MRN: [id number] DOB: Jul 31, 1945  Patient does not have glucagon or sliding scale insulin. Please advise

## 2022-08-25 NOTE — Transitions of Care (Post Inpatient/ED Visit) (Unsigned)
08/25/2022  Name: Shelly Blevins MRN: [id number] DOB: 1945-12-01  Today's TOC FU Call Status: Today's TOC FU Call Status:: Successful TOC FU Call Competed TOC FU Call Complete Date: 08/25/22  Transition Care Management Follow-up Telephone Call Date of Discharge: 08/21/22 Discharge Facility: [facility]) Type of Discharge: Emergency Department Reason for ED Visit: Endocrine Endocrine Diagnosis: Uncontrolled Diabetes How have you been since you were released from the hospital?: Better Any questions or concerns?: Yes (doesn't have medication for sliding scale)  Items Reviewed: Did you receive and understand the discharge instructions provided?: Yes Medications obtained,verified, and reconciled?: Yes (Medications Reviewed) Any new allergies since your discharge?: No Dietary orders reviewed?: No Do you have support at home?: Yes  Medications Reviewed Today: Medications Reviewed Today     Reviewed by Britt Boozer, CMA (Certified Medical Assistant) on 08/25/22 at 1658  Med List Status: <None>   Medication Order Taking? Sig Documenting Provider Last Dose Status Informant  acetaminophen (TYLENOL) 500 MG tablet [id number] Yes Take 1,000 mg by mouth every 6 (six) hours as needed for mild pain or moderate pain. [provider] Taking Active Self, Pharmacy Records  albuterol (VENTOLIN HFA) 108 (90 Base) MCG/ACT inhaler [id number] Yes Inhale 2 puffs into the lungs every 6 (six) hours as needed for wheezing or shortness of breath. [provider] Taking Active Self, Pharmacy Records  allopurinol (ZYLOPRIM) 100 MG tablet [id number] Yes Take 1 Tablet by mouth once daily Tysinger, Kermit Balo, PA-C Taking Active Self, Pharmacy Records  amLODipine (NORVASC) 10 MG tablet [id number] Yes Take 1 tablet (10 mg total) by mouth daily. Tysinger, Kermit Balo, PA-C Taking Active Self, Pharmacy Records  aspirin EC 81 MG tablet [id number] Yes Take 1 tablet (81 mg total) by mouth daily. Tysinger,  Kermit Balo, PA-C Taking Active Self, Pharmacy Records  atorvastatin (LIPITOR) 80 MG tablet [id number] Yes Take 1 tablet (80 mg total) by mouth daily.  Patient taking differently: Take 80 mg by mouth every evening.   Tysinger, Kermit Balo, PA-C Taking Active Self, Pharmacy Records  BD PEN NEEDLE NANO 2ND GEN 32G X 4 MM MISC [id number]  2 (two) times daily. as directed [provider]  Active Self, Pharmacy Records  donepezil (ARICEPT) 10 MG tablet [id number] Yes Take 1 tablet (10 mg total) by mouth at bedtime. Helps slow down memory loss.  Patient taking differently: Take 10 mg by mouth every evening.   Butch Penny, NP Taking Active Self, Pharmacy Records  DULoxetine (CYMBALTA) 30 MG capsule [id number] Yes Take 30 mg by mouth daily. [provider] Taking Active Self, Pharmacy Records           Med Note (COFFELL, Marzella Schlein   Fri Aug 21, 2022  3:02 PM) Replacing gabapentin.  famotidine (PEPCID) 40 MG tablet [id number] Yes Take 40 mg by mouth 2 (two) times daily. [provider] Taking Active Self, Pharmacy Records  Glucagon (GVOKE HYPOPEN 2-PACK) 1 MG/0.2ML SOAJ [id number] No Inject 1 each into the skin as needed.  Patient not taking: Reported on 08/25/2022   Jac Canavan, PA-C Not Taking Active Self, Pharmacy Records           Med Note Jola Schmidt   Fri Aug 21, 2022  8:43 AM)    insulin aspart (NOVOLOG) 100 UNIT/ML FlexPen [id number] No Inject 0-6 Units into the skin 3 (three) times daily with meals. Use Sliding Scale per instructions provided.  Patient not taking: Reported on 08/25/2022   Pennie Banter, DO Not  Taking Active            Med Note Herminio Commons A   Tue Aug 25, 2022  4:58 PM) Drug store doesn't have this yet  JARDIANCE 10 MG TABS tablet [id number] Yes Take 10 mg by mouth daily. [provider] Taking Active Self, Pharmacy Records  latanoprost (XALATAN) 0.005 % ophthalmic solution [id number] Yes Place 1 drop into both eyes at bedtime.  [provider] Taking Active Self, Pharmacy Records  levothyroxine (SYNTHROID) 50 MCG tablet [id number] Yes Take 1 Tablet by mouth once daily Tysinger, Kermit Balo, PA-C Taking Active Self, Pharmacy Records  losartan (COZAAR) 50 MG tablet [id number] Yes Take 50 mg by mouth daily. [provider] Taking Active Self, Pharmacy Records  metFORMIN (GLUCOPHAGE) 500 MG tablet [id number] Yes Take 1,000 mg by mouth 2 (two) times daily. [provider] Taking Active Self, Pharmacy Records  NOVOLOG MIX 70/30 FLEXPEN (70-30) 100 UNIT/ML FlexPen [id number] Yes Inject 5 Units into the skin 2 (two) times daily with a meal. 60 units in the morning with breakfast, 10 units with lunch, 60 units in the evening with dinner. Pennie Banter, DO Taking Active   spironolactone (ALDACTONE) 25 MG tablet [id number] Yes Take 0.5 tablets (12.5 mg total) by mouth daily. Tysinger, Kermit Balo, PA-C Taking Active Self, Pharmacy Records            Home Care and Equipment/Supplies: Were Home Health Services Ordered?: No Any new equipment or medical supplies ordered?: No  Functional Questionnaire: Do you need assistance with bathing/showering or dressing?: No Do you need assistance with meal preparation?: No Do you need assistance with eating?: No Do you have difficulty maintaining continence: No Do you need assistance with getting out of bed/getting out of a chair/moving?: No Do you have difficulty managing or taking your medications?: No  Follow up appointments reviewed: PCP Follow-up appointment confirmed?: Yes    SIGNATURE: Kimya Mccahill, CMA

## 2022-08-26 NOTE — Telephone Encounter (Signed)
Tried to call pt but vm is not set up 

## 2022-08-26 NOTE — Telephone Encounter (Signed)
Tried to call pt but VM not setup. 

## 2022-08-28 NOTE — Telephone Encounter (Signed)
Spoke with Daughter and she got her meds yesterday and has Hospital follow-up here next week.

## 2022-09-02 ENCOUNTER — Other Ambulatory Visit: Payer: Self-pay

## 2022-09-02 ENCOUNTER — Ambulatory Visit: Payer: Medicare HMO | Attending: Internal Medicine | Admitting: Physical Therapy

## 2022-09-02 ENCOUNTER — Encounter: Payer: Self-pay | Admitting: Physical Therapy

## 2022-09-02 DIAGNOSIS — R29898 Other symptoms and signs involving the musculoskeletal system: Secondary | ICD-10-CM | POA: Insufficient documentation

## 2022-09-02 DIAGNOSIS — R2681 Unsteadiness on feet: Secondary | ICD-10-CM | POA: Insufficient documentation

## 2022-09-02 DIAGNOSIS — R42 Dizziness and giddiness: Secondary | ICD-10-CM | POA: Insufficient documentation

## 2022-09-02 DIAGNOSIS — M6281 Muscle weakness (generalized): Secondary | ICD-10-CM | POA: Insufficient documentation

## 2022-09-02 DIAGNOSIS — R55 Syncope and collapse: Secondary | ICD-10-CM | POA: Diagnosis not present

## 2022-09-02 NOTE — Therapy (Signed)
OUTPATIENT PHYSICAL THERAPY NEURO EVALUATION   Patient Name: Shelly Blevins MRN: [id number] DOB:January 02, 1946, 77 y.o., female Today's Date: 09/02/2022   PCP: Benard Rink  REFERRING PROVIDER: Lanae Boast, MD  END OF SESSION:  PT End of Session - 09/02/22 1132     Visit Number 1    Number of Visits 17    Date for PT Re-Evaluation 10/28/22    Authorization Type [employer] MCR    Authorization Time Period 09/02/22 to 10/28/22    Progress Note Due on Visit 10    PT Start Time 0933    PT Stop Time 1013    PT Time Calculation (min) 40 min    Activity Tolerance Patient tolerated treatment well;Patient limited by fatigue    Behavior During Therapy [facility] for tasks assessed/performed             Past Medical History:  Diagnosis Date   Allergy    Arthritis    CAD (coronary artery disease)    mild nonobstructive disease, 30% Cfx lesion   Cataract    Diabetes mellitus    insulin-dependent; Dr. Talmage Nap   Dyslipidemia    GERD (gastroesophageal reflux disease)    Hyperlipidemia    Hypertension    Hypothyroidism    Dr. Talmage Nap   Stroke [facility])    Past Surgical History:  Procedure Laterality Date   ABDOMINAL HYSTERECTOMY     CARDIAC CATHETERIZATION  02/17/2007   mild CAD with 50% prox L Cfx stenosis, normal LV function (Dr. Claudia Desanctis)   CHOLECYSTECTOMY     EYE SURGERY     NM MYOCAR PERF WALL MOTION  2009   lexiscan myoview - perfusion defect in anterior myociaral region (breast attenuation) - low risk scan - EF 69%   THYROIDECTOMY     TRANSTHORACIC ECHOCARDIOGRAM  2008   normal study    Patient Active Problem List   Diagnosis Date Noted   Fall 08/19/2022   Hypoglycemia associated with diabetes (HCC) 08/19/2022   Near syncope 08/19/2022   Elevated uric acid in blood 05/12/2021   Chronic gout without tophus 05/12/2021   High risk medication use 05/12/2021   Anemia 01/23/2021   Diabetic neuropathy (HCC) 01/15/2021   Long term (current) use of insulin (HCC) 01/15/2021    Diabetes mellitus (HCC) 12/31/2020   Hypertrophy of nail 12/31/2020   Pain in joints of both feet 12/31/2020   Abnormal ankle brachial index (ABI) 12/31/2020   Encounter for health maintenance examination in adult 04/25/2020   Medicare annual wellness visit, subsequent 04/25/2020   Memory change 04/25/2020   Post-menopausal 04/25/2020   Constipation 02/29/2020   Osteoarthritis of both knees 01/10/2020   Chronic pain of both knees 03/28/2019   Vitamin D deficiency 03/28/2019   Ataxia 11/02/2016   Estrogen deficiency 11/02/2016   Vaccine counseling 10/09/2015   Advanced directives, counseling/discussion 10/09/2015   Need for prophylactic vaccination against Streptococcus pneumoniae (pneumococcus) 10/09/2015   Hypothyroidism 04/17/2015   Essential hypertension 06/06/2013   Dyslipidemia 06/06/2013   Coronary artery disease due to lipid rich plaque 06/06/2013   GERD 10/29/2008    ONSET DATE: 08/19/22  REFERRING DIAG: R55 (ICD-10-CM) - Near syncope  THERAPY DIAG:  Muscle weakness (generalized)  Unsteadiness on feet  Other symptoms and signs involving the musculoskeletal system  Dizziness and giddiness  Rationale for Evaluation and Treatment: Rehabilitation  SUBJECTIVE:  SUBJECTIVE STATEMENT: Fell on 08/19/22, just fell and hit the floor. Hospital ruled out any injuries or fractures from the fall. They told me all of this was probably due to low blood sugars. I have vertigo "once in awhile", maybe once a month, seems to go along with  when I stand up too fast, no dizziness from turning head or rolling over in bed or anything like this. Have a lot of pain in my legs and arms, its been going on for about a year. Pt accompanied by:  grand-daughter   PERTINENT HISTORY: OA, CAD, DM, HLD, HTN,  hypothyroidism, CVA, hx cardiac cath, hx cholecystectomy  PAIN:  Are you having pain? Yes: NPRS scale: 3/10 Pain location: arms and legs  Pain description: sharp like its going through the bone  Aggravating factors: being cold  Relieving factors: heat   PRECAUTIONS: Fall  WEIGHT BEARING RESTRICTIONS: No  FALLS: Has patient fallen in last 6 months? Yes. Number of falls 2- fall that brought her to the ED, slipped off EOB to the floor on another occasion, no FOF   LIVING ENVIRONMENT: Lives with: lives with their family Lives in: House/apartment Stairs: ramped entrance, no steps inside home  Has following equipment at home: shower chair, Grab bars, and Ramped entry  PLOF: Independent, Independent with basic ADLs, Independent with gait, and Independent with transfers  PATIENT GOALS: be able to walk again "not a long ways but be able to walk about a block"   OBJECTIVE:   DIAGNOSTIC FINDINGS: Right hand wrist elbow and shoulder pain swelling x 1 wk [facility]en at [facility] -xray done, worried abt steroid use due to her Dm per family, xray and blood worse were done there- was told it is from Rheumatoid arthritis-she will need to follow-up with rheumatology outpatient Added scheduled Tylenol 1000 mg, ibuprofen as needed  PT OT evaluations - need rehab needs.  IMPRESSION: 1.  No evidence of an acute intracranial abnormality. 2. Mild chronic small vessel ischemic changes within the cerebral white matter. 3. Mild generalized parenchymal atrophy. 4. Bilateral proptosis.   Left ankle pain and numbness- now mostly pain Hx of gout: on allupurinol.  COGNITION: Overall cognitive status: Within functional limits for tasks assessed   SENSATION: Not tested     LOWER EXTREMITY MMT:    MMT Right Eval Left Eval  Hip flexion 3 3  Hip extension    Hip abduction 3 at best (tested in sitting due to time limits of session) 3 at best (tested in sitting due to time limits of session)  Hip  adduction    Hip internal rotation    Hip external rotation    Knee flexion    Knee extension 4+ 4+  Ankle dorsiflexion 3- painful  3- painful   Ankle plantarflexion    Ankle inversion    Ankle eversion    (Blank rows = not tested)      GAIT: Gait pattern: step through pattern, decreased arm swing- Right, decreased arm swing- Left, decreased step length- Right, decreased step length- Left, decreased stance time- Right, decreased stance time- Left, decreased stride length, decreased ankle dorsiflexion- Right, decreased ankle dorsiflexion- Left, trendelenburg, decreased trunk rotation, trunk flexed, and narrow BOS Distance walked: in clinic distances  Assistive device utilized: None Level of assistance: Complete Independence Comments: very slow but steady on even surface without obstacles   FUNCTIONAL TESTS:  Berg Balance Scale: 35/56  VESTIBULAR TESTING  Oculomotor alignment WNL Eye ROM WNL, no nystagmus noted at rest  Visual tracking WNL, eyes did come off of target occasionally but question if this was due to cognition/attention Horizontal and vertical head shakes WNL VOR cancellation WNL but very limited due to trunk/shoulder stiffness  Head Impulse Test negative B  Dix Hallpike negative B Roll test for horizontal canal B     PATIENT SURVEYS:  FOTO 42, predicted 47 in 10 visits   TODAY'S TREATMENT:                                                                                                                              DATE:   Eval  Objective measures/appropriate education/care planning     PATIENT EDUCATION: Education details: exam findings, vestibular w/u clear, plan of care, role of deconditioning/weakness in general presentation  Person educated: Patient Education method: Explanation Education comprehension: verbalized understanding, returned demonstration, and needs further education  HOME EXERCISE PROGRAM: Will give 2nd session   GOALS: Goals  reviewed with patient? Yes  SHORT TERM GOALS: Target date: 09/30/2022    Will be compliant with appropriate progressive HEP with MinA from family  Baseline: Goal status: INITIAL  2.  Subjective complaints of whole body stiffness to have improved by 50%  Baseline:  Goal status: INITIAL  3.  Pt and family to be able to verbalize 3 ways to reduce fall risk at home and in the community  Baseline:  Goal status: INITIAL  4.  Will be compliant with daily 10 minute walking program with family in order to improve general functional activity tolerance and complaints of fatigue  Baseline:  Goal status: INITIAL    LONG TERM GOALS: Target date: 10/28/2022    MMT to improve by at least 1 grade in all weak groups  Baseline:  Goal status: INITIAL  2.  Will score at least 43 on Berg balance test in order to show reduced fall risk Baseline:  Goal status: INITIAL  3.  Will be able to ambulate at least 579ft in with LRAD in order to improve community access  Baseline:  Goal status: INITIAL  4.  Will be compliant with appropriate advanced HEP with MinA from family in addition to regular advanced walking program (20-30 minutes at least 3x/week) in order to maintain functional gains from PT  Baseline:  Goal status: INITIAL    ASSESSMENT:  CLINICAL IMPRESSION: Patient is a 77 y.o. F who was seen today for physical therapy evaluation and treatment for near syncope. Unfortunately she is a bit of a poor historian, family was present and was able to confirm/disconfirm physical concerns and also stated that Ms. Hedstrom does regularly have dizziness. Vestibular screen was WNL and without significant concern, suspect possibly mild orthostatic hypotension given some increased dizziness with position changes. She also demonstrates significant functional weakness and unsteadiness. Will benefit from skilled PT services to address all impairments, reduce fall risk, and optimize overall level of function  moving forward.   OBJECTIVE IMPAIRMENTS: Abnormal gait, decreased activity  tolerance, decreased balance, decreased cognition, decreased coordination, decreased mobility, difficulty walking, decreased ROM, decreased strength, hypomobility, impaired perceived functional ability, and pain.   ACTIVITY LIMITATIONS: standing, squatting, transfers, bed mobility, and locomotion level  PARTICIPATION LIMITATIONS: cleaning, laundry, shopping, community activity, and church  PERSONAL FACTORS: Age, Behavior pattern, Education, Fitness, Past/current experiences, Social background, and Time since onset of injury/illness/exacerbation are also affecting patient's functional outcome.   REHAB POTENTIAL: Good  CLINICAL DECISION MAKING: Stable/uncomplicated  EVALUATION COMPLEXITY: Low  PLAN:  PT FREQUENCY: 2x/week  PT DURATION: 8 weeks  PLANNED INTERVENTIONS: Therapeutic exercises, Therapeutic activity, Neuromuscular re-education, Gait training, Self Care,  and Re-evaluation  PLAN FOR NEXT SESSION: assess gait/gait speed, monitor reports of vertigo and re-examine PRN. Otherwise focus on functional strength and balance, functional activity tolerance. Still needs HEP.   Nedra Hai PT DPT PN2

## 2022-09-03 ENCOUNTER — Ambulatory Visit (INDEPENDENT_AMBULATORY_CARE_PROVIDER_SITE_OTHER): Payer: Medicare HMO | Admitting: Medical

## 2022-09-03 ENCOUNTER — Telehealth: Payer: Self-pay

## 2022-09-03 ENCOUNTER — Telehealth: Payer: Self-pay | Admitting: Internal Medicine

## 2022-09-03 VITALS — BP 110/70 | HR 65 | Wt 178.0 lb

## 2022-09-03 DIAGNOSIS — E1169 Type 2 diabetes mellitus with other specified complication: Secondary | ICD-10-CM | POA: Diagnosis not present

## 2022-09-03 DIAGNOSIS — E7800 Pure hypercholesterolemia, unspecified: Secondary | ICD-10-CM

## 2022-09-03 DIAGNOSIS — E785 Hyperlipidemia, unspecified: Secondary | ICD-10-CM

## 2022-09-03 DIAGNOSIS — R630 Anorexia: Secondary | ICD-10-CM

## 2022-09-03 DIAGNOSIS — E790 Hyperuricemia without signs of inflammatory arthritis and tophaceous disease: Secondary | ICD-10-CM | POA: Diagnosis not present

## 2022-09-03 DIAGNOSIS — F339 Major depressive disorder, recurrent, unspecified: Secondary | ICD-10-CM

## 2022-09-03 DIAGNOSIS — K219 Gastro-esophageal reflux disease without esophagitis: Secondary | ICD-10-CM

## 2022-09-03 DIAGNOSIS — E11649 Type 2 diabetes mellitus with hypoglycemia without coma: Secondary | ICD-10-CM

## 2022-09-03 DIAGNOSIS — Z79899 Other long term (current) drug therapy: Secondary | ICD-10-CM

## 2022-09-03 DIAGNOSIS — R413 Other amnesia: Secondary | ICD-10-CM | POA: Diagnosis not present

## 2022-09-03 DIAGNOSIS — E039 Hypothyroidism, unspecified: Secondary | ICD-10-CM | POA: Diagnosis not present

## 2022-09-03 DIAGNOSIS — Z8739 Personal history of other diseases of the musculoskeletal system and connective tissue: Secondary | ICD-10-CM

## 2022-09-03 DIAGNOSIS — M1A9XX0 Chronic gout, unspecified, without tophus (tophi): Secondary | ICD-10-CM

## 2022-09-03 DIAGNOSIS — Z09 Encounter for follow-up examination after completed treatment for conditions other than malignant neoplasm: Secondary | ICD-10-CM

## 2022-09-03 DIAGNOSIS — I10 Essential (primary) hypertension: Secondary | ICD-10-CM

## 2022-09-03 MED ORDER — FREESTYLE LIBRE 14 DAY SENSOR MISC: 2.0000 | 11 refills | Status: DC

## 2022-09-03 MED ORDER — ALLOPURINOL 100 MG PO TABS
100.0000 mg | ORAL_TABLET | Freq: Every day | ORAL | 1 refills | Status: DC
Start: 2022-09-03 — End: 2023-03-08

## 2022-09-03 MED ORDER — LEVOTHYROXINE SODIUM 50 MCG PO TABS: 50.0000 ug | ORAL_TABLET | Freq: Every day | ORAL | 1 refills | Status: DC

## 2022-09-03 MED ORDER — ATORVASTATIN CALCIUM 80 MG PO TABS
80.0000 mg | ORAL_TABLET | Freq: Every evening | ORAL | 1 refills | Status: DC
Start: 2022-09-03 — End: 2023-06-09

## 2022-09-03 MED ORDER — ASPIRIN 81 MG PO TBEC: 81.0000 mg | DELAYED_RELEASE_TABLET | Freq: Every day | ORAL | 3 refills | Status: DC

## 2022-09-03 MED ORDER — FREESTYLE LIBRE 2 READER DEVI: 1.0000 | Freq: Every day | 0 refills | Status: DC

## 2022-09-03 MED ORDER — SPIRONOLACTONE 25 MG PO TABS
12.5000 mg | ORAL_TABLET | Freq: Every day | ORAL | 1 refills | Status: DC
Start: 2022-09-03 — End: 2023-06-09

## 2022-09-03 MED ORDER — AMLODIPINE BESYLATE 10 MG PO TABS: 10.0000 mg | ORAL_TABLET | Freq: Every day | ORAL | 1 refills | Status: DC

## 2022-09-03 NOTE — Telephone Encounter (Signed)
-----   Message from Sherrill Raring, [facility] sent at 09/03/2022 12:59 PM EDT ----- Regarding: 2300-Pharmacy Urgent Referral Hey,   Vincenza Hews just asked if I could meet with this patient to get her meds reconciled. Can an urgent 2300-Pharmacy referral be placed so I can contact her please?  Thank you! Sherrill Raring Clinical Pharmacist [phone]

## 2022-09-03 NOTE — Progress Notes (Signed)
Patient ID: Shelly Blevins, female   DOB: [birth year], 77 y.o.   MRN: [id number]  Care Management & Coordination Services Pharmacy Team  Reason for Encounter: Chart prep for initial encounter wih Johny Drilling D on 09/10/22 at 3:30 in office.  Have you seen any other providers since your last visit? Patient reports none  Any changes in your medications or health? Patient reports no  Any side effects from any medications? Patient reports none  Do you have an symptoms or problems not managed by your medications? Patient reports no  Any concerns about your health right now? Patient reports she takes a lot of medications and she gets frustrated with the amount of things she has to take.  Has your provider asked that you check blood pressure, blood sugar, or follow special diet at home? Patient reports she has a cuff and is using the freestyle system for her sugars.  Do you get any type of exercise on a regular basis? Patient reports she is doing some housework.  Can you think of a goal you would like to reach for your health? Patient reports not at this time  Do you have any problems getting your medications? Patient reports she is doing well with obtaining and the cost of her medications.  Is there anything that you would like to discuss during the appointment? Patient reports none  Patient aware to bring blood pressure cuff, medications that do not need refrigeration and supplements to appointment as well as the appointment location.   Chart review:  Recent office visits:  09/03/22 Tysinger, Kermit Balo, PA-C - Patient presented for Medication management and other concerns. No medication changes.  04/29/22 Tysinger, Kermit Balo, PA-C - Clinical support encounter for health maintenance examination in an adult and other concerns. Stopped Cholecalciferol. Stopped Prednisone.   Recent consult visits:  09/02/22 Milinda Pointer, PT - Patient presented for Muscle weakness and other concerns. No  medication changes.  08/18/22 Mickey Farber, PA - [facility] presented to [facility] for pain of right wrist and other concerns.  No medication changes.  06/12/22 Lenna Gilford, [facility]  -Phone visit with [facility]   04/30/22 Binnie Rail, PsyD (Neuropsychology)  - Patient presented for Mild dementia with agitation and other concerns. No medication changes.  Hospital visits:  Medication Reconciliation was completed by comparing discharge summary, patient's EMR and Pharmacy list, and upon discussion with patient.  [facility]22 due to Hypoglycemia associated with diabetes. Patient was present for 2 days  New?Medications Started at [facility] Discharge:?? -started Novolog  Medication Changes at Hospital Discharge: -Changed  metFORMIN (GLUCOPHAGE) NovoLOG Mix 70/30 FlexPen (insulin aspart protamine - aspart)  Medications Discontinued at Hospital Discharge: -Stopped  zetia  Medications that remain the same after Hospital Discharge:??  -All other medications will remain the same.     Fill History : albuterol sulfate HFA 90 mcg/actuation aerosol inhaler 07/08/2022 25   allopurinol 100 mg tablet 08/10/2022 30   amlodipine 10 mg tablet 08/10/2022 30   aspirin 81 mg tablet,delayed release 08/10/2022 30   atorvastatin 80 mg tablet 07/16/2022 90   Accu-Chek Guide Glucose Meter 03/31/2022 90   donepezil 10 mg tablet 07/16/2022 90   duloxetine 30 mg capsule,delayed release 08/10/2022 30   Jardiance 10 mg tablet 07/16/2022 90   ezetimibe 10 mg tablet 07/16/2022 90   famotidine 40 mg tablet 08/10/2022 30   Accu-Chek Guide test strips 03/31/2022 90  GVOKE HYPO 1 1MG /.2ML INJ 05/13/2021 1   Novolog Flexpen U-100 Insulin aspart 100 unit/mL (3 mL) subcutaneous 08/27/2022 63   Novolog Mix 70-30 FlexPen U-100 Insulin 100 unit/mL subcutaneous pen 08/31/2022 108   BD Ultra-Fine Nano Pen Needle 32 gauge x  5/32" 07/20/2022 50   Accu-Chek Softclix Lancets 03/31/2022 90   latanoprost 0.005 % eye drops 08/10/2022 25   levothyroxine 50 mcg tablet 07/16/2022 90   losartan 50 mg tablet 08/14/2022 30   metformin 500 mg tablet 08/10/2022 30   ramipril 10 mg capsule 07/16/2022 90   spironolactone 25 mg tablet 07/16/2022 90   Star Rating Drugs:  Atorvastatin 80 mg - Last filled 07/16/22 90 DS at [facility] Losartan 50 mg  -Last filled 08/14/22 30 DS at [facility] Metformin 500 mg - Last filled 08/10/22 30 DS at [facility] Novolog Flex pen 70/30 - Last filled 08/31/22 108 [facility]   Care Gaps: A1C - Overdue COVID Booster - Overdue Zoster Vaccine - Overdue AWV - 04/29/22   Pamala Duffel CMA Clinical Pharmacist Assistant [phone]

## 2022-09-03 NOTE — Progress Notes (Signed)
Subjective:  Shelly Blevins is a 77 y.o. female who presents for Chief Complaint  Patient presents with   Hospitalization Follow-up    Hospital follow-up. BS running normal now. Hospital thinks she was getting to much insulin at one time     Here with her daughter Shelly Blevins today.  Primary Care Provider Shelly Blevins, Shelly Balo, PA-C here for primary care  Current Health Care Team: Dentist, none, has full dentures Eye doctor, Dr. Dr. Elmer Blevins, Shelly Blevins Shelly Blevins, Shelly Maizes PA, endocrinology Shelly Blevins, cardiology Shelly Blevins, GI Shelly Blevins, rheumatology Shelly Blevins, Shelly Blevins and Shelly Blevins, vascular surgery [facility] Shelly Blevins and Shelly Penny, NP, neurology  Here for hospital follow up.  Her medical history includes hypertension, dyslipidemia, coronary artery disease, hypothyroidism, GERD, depression, constipation, diabetes, diabetic neuropathy, anemia, chronic gout, memory loss  She was hospitalized 08/19/2022 through 08/21/2022 for hypoglycemia  Since coming home from the hospital is doing okay but she continues to have long-term decreased mood, no motivation, no appetite.  Cymbalta does not seem to help with her mood.  She feels burdened with too many pills.  Some of her medicines are very expensive.  Shelly Blevins is over $150 per month, insulin was over 100 bucks per month but she found that she could send the [facility] to [facility] $45 per month.  Prior to her recent hospitalization she was on 60 units of insulin twice daily.  But given the hypoglycemia and they discharged her on 5 units twice daily of the 70/30 mix insulin.  She was started on the freestyle libre glucose continuous monitor and recent blood sugars have been ranging 80-110 per daughter.  She is supposed to follow-up with Shelly Blevins tomorrow but she wants to ultimately see a different endocrinologist  Gvoke device for hypoglycemia was prescribed but she has not picked  that up.  Apparently that got sent to [facility].  She does keep glucose tablets available for hyperglycemia  When asked about follow-up with cardiology she states she just had a recent echocardiogram that was not in the chart record.  She apparently has been seeing [facility] up the street from [place] which is a different primary care office.  We were not aware of this.  She notes compliance with her other medicines.  Currently living with her son for the past year  No other aggravating or relieving factors.    No other c/o.  Past Medical History:  Diagnosis Date   Allergy    Arthritis    CAD (coronary artery disease)    mild nonobstructive disease, 30% Cfx lesion   Cataract    Diabetes mellitus    insulin-dependent; Shelly Blevins   Dyslipidemia    GERD (gastroesophageal reflux disease)    Hyperlipidemia    Hypertension    Hypothyroidism    Shelly Blevins   Stroke [facility])    Current Outpatient Medications on File Prior to Visit  Medication Sig Dispense Refill   acetaminophen (TYLENOL) 500 MG tablet Take 1,000 mg by mouth every 6 (six) hours as needed for mild pain or moderate pain.     albuterol (VENTOLIN HFA) 108 (90 Base) MCG/ACT inhaler Inhale 2 puffs into the lungs every 6 (six) hours as needed for wheezing or shortness of breath.     donepezil (ARICEPT) 10 MG tablet Take 1 tablet (10 mg total) by mouth at bedtime. Helps slow down memory loss. (Patient taking differently: Take 10 mg by mouth  every evening.) 30 tablet 11   DULoxetine (CYMBALTA) 30 MG capsule Take 30 mg by mouth daily.     famotidine (PEPCID) 40 MG tablet Take 40 mg by mouth 2 (two) times daily.     Glucagon (GVOKE HYPOPEN 2-PACK) 1 MG/0.2ML SOAJ Inject 1 each into the skin as needed. 0.2 mL 1   insulin aspart (NOVOLOG) 100 UNIT/ML FlexPen Inject 0-6 Units into the skin 3 (three) times daily with meals. Use Sliding Scale per instructions provided. 15 mL 11   JARDIANCE 10 MG TABS tablet Take 10 mg by mouth daily.      latanoprost (XALATAN) 0.005 % ophthalmic solution Place 1 drop into both eyes at bedtime.  5   losartan (COZAAR) 50 MG tablet Take 50 mg by mouth daily.     metFORMIN (GLUCOPHAGE) 500 MG tablet Take 1,000 mg by mouth 2 (two) times daily.     NOVOLOG MIX 70/30 FLEXPEN (70-30) 100 UNIT/ML FlexPen Inject 5 Units into the skin 2 (two) times daily with a meal. 60 units in the morning with breakfast, 10 units with lunch, 60 units in the evening with dinner. (Patient taking differently: Inject 5 Units into the skin 2 (two) times daily with a meal.) 15 mL 3   BD PEN NEEDLE NANO 2ND GEN 32G X 4 MM MISC 2 (two) times daily. as directed     No current facility-administered medications on file prior to visit.     The following portions of the patient's history were reviewed and updated as appropriate: allergies, current medications, past family history, past medical history, past social history, past surgical history and problem list.  ROS Otherwise as in subjective above    Objective: BP 110/70   Pulse 65   Wt 178 lb (80.7 kg)   LMP  (LMP Unknown)   BMI 32.56 kg/m   Wt Readings from Last 3 Encounters:  09/03/22 178 lb (80.7 kg)  08/19/22 185 lb (83.9 kg)  04/29/22 188 lb 9.6 oz (85.5 kg)   BP Readings from Last 3 Encounters:  09/03/22 110/70  08/21/22 (!) 151/62  04/29/22 110/60   General appearance: alert, no distress, well developed, well nourished Heart: RRR, normal S1, S2, no murmurs Lungs: CTA bilaterally, no wheezes, rhonchi, or rales Pulses: 2+ radial pulses, 2+ pedal pulses, normal cap refill Ext: no edema Psych: Seems down and mood in general    Assessment: Encounter Diagnoses  Name Primary?   Medication management Yes   Hospital discharge follow-up    Essential hypertension    Chronic gout without tophus, unspecified cause, unspecified site    Hypercholesterolemia    Hypothyroidism, unspecified type    Gastroesophageal reflux disease, unspecified whether  esophagitis present    Dyslipidemia    Memory change    Type 2 diabetes mellitus with other specified complication, with long-term current use of insulin (HCC)    High risk medication use    Elevated uric acid in blood    Hypoglycemia associated with diabetes (HCC)    History of gout    Appetite impaired    Depression, recurrent (HCC)      Plan I reviewed her recent discharge summary notes, medicines were reconciled.  There were several concerns outlined below as we discussed her hospital follow-up issues today.  She is burdened by polypharmacy.  She apparently has been seeing another primary care office in conjunction with [place] which can only confuse the matter.  I recommended she either pick [place] her name but not have  2 different primary care offices.  We will request the recent echocardiogram she had done through [facility].  I spoke to our embedded pharmacist who happens to be at our office today about getting her in for a pharmacy medication review and consult.  We will get her an urgent referral for this given all the complexities of her medications.  She does not want to be taking as many medications.  We discussed that some of her medicines are available in combo forms but not sure if insurance will pay for these or not.  We need to switch her to a freestyle with a reader since she does not have a smart phone.  Somehow she got discharged hospital using a freestyle connected to her son's phone but she does not have access to her son's phone all the time.  Decreased appetite long-term-can use Ensure for meal replacement if needed.  She does not want to add another pill such as cyproheptadine to help with appetite.  We discussed possibly changing to Paxil instead of Cymbalta which could potentially help with appetite and mood  Depression-I recommend we change from Cymbalta to Paxil.  She would probably get more benefit with Paxil.  For now I will leave her on the Cymbalta till she has a  pharmacy consult with the embedded pharmacist  GERD-she knows that she needs to continue on her famotidine so we will continue that for now  Hypothyroidism-managed by endocrinology, but I do not have their most recent thyroid labs or records.  According to our refill notes that she needs a refill today on levothyroxine  Memory loss-continues on Aricept per neurology  Hypertension Currently on amlodipine 10 mg daily, spironolactone 25 mg, 1/2 tablet daily, losartan 50 mg daily.  Consider changing to amlodipine olmesartan combo to reduce the pill burden.  Or consider continuing spironolactone was just olmesartan with monitoring the blood pressure and to see if we can reduce one of her pills.  I asked him to monitor her blood pressure 3 to 4 days/week and bring numbers in at her next visit  Hyperlipidemia-Zetia was discontinued at the hospital.  Continue atorvastatin 80 mg, and lets plan to recheck lipids in 6 to 8 weeks fasting   History of elevated uric acid and gout-continue allopurinol 100 mg daily   Makylah was seen today for hospitalization follow-up.  Diagnoses and all orders for this visit:  Medication management  Hospital discharge follow-up  Essential hypertension -     amLODipine (NORVASC) 10 MG tablet; Take 1 tablet (10 mg total) by mouth daily. -     spironolactone (ALDACTONE) 25 MG tablet; Take 0.5 tablets (12.5 mg total) by mouth daily.  Chronic gout without tophus, unspecified cause, unspecified site -     allopurinol (ZYLOPRIM) 100 MG tablet; Take 1 tablet (100 mg total) by mouth daily.  Hypercholesterolemia -     atorvastatin (LIPITOR) 80 MG tablet; Take 1 tablet (80 mg total) by mouth every evening.  Hypothyroidism, unspecified type -     levothyroxine (SYNTHROID) 50 MCG tablet; Take 1 tablet (50 mcg total) by mouth daily.  Gastroesophageal reflux disease, unspecified whether esophagitis present  Dyslipidemia  Memory change  Type 2 diabetes mellitus with  other specified complication, with long-term current use of insulin (HCC) -     Ambulatory referral to Endocrinology  High risk medication use  Elevated uric acid in blood  Hypoglycemia associated with diabetes [facility]) -     Ambulatory referral to Endocrinology  History  of gout  Appetite impaired  Depression, recurrent (HCC)  Other orders -     aspirin EC 81 MG tablet; Take 1 tablet (81 mg total) by mouth daily. -     Continuous Glucose Receiver (FREESTYLE LIBRE 2 READER) DEVI; 1 each by Does not apply route daily. -     Continuous Glucose Sensor (FREESTYLE LIBRE 14 DAY SENSOR) MISC; 2 each by Does not apply route every 14 (fourteen) days.  Spent > 45 minutes face to face with patient in discussion of symptoms, evaluation, plan and recommendations.    Follow up: 3-week follow-up

## 2022-09-03 NOTE — Telephone Encounter (Signed)
done

## 2022-09-04 MED ORDER — FREESTYLE LIBRE 3 SENSOR MISC: 1 refills | Status: DC

## 2022-09-04 MED ORDER — FREESTYLE LIBRE 3 READER DEVI: 1.0000 | Freq: Every day | 0 refills | Status: DC

## 2022-09-04 NOTE — Addendum Note (Signed)
Addended by: Herminio Commons A on: 09/04/2022 09:13 AM   Modules accepted: Orders

## 2022-09-07 ENCOUNTER — Ambulatory Visit: Payer: Medicare HMO | Admitting: Adult Health

## 2022-09-07 ENCOUNTER — Encounter: Payer: Self-pay | Admitting: Adult Health

## 2022-09-07 VITALS — BP 125/54 | HR 67 | Ht 62.0 in | Wt 179.0 lb

## 2022-09-07 DIAGNOSIS — R413 Other amnesia: Secondary | ICD-10-CM | POA: Diagnosis not present

## 2022-09-07 NOTE — Progress Notes (Signed)
PATIENT: Shelly Blevins DOB: 1945-10-15  REASON FOR VISIT: follow up HISTORY FROM: patient PRIMARY NEUROLOGIST: Dr. Lucia Gaskins  Chief Complaint  Patient presents with   Follow-up    Pt in 19 with granddaughter  Pt states short term memory is worse Granddaughter states pt in hospital in May 2024 for low Blood Sugar      HISTORY OF PRESENT ILLNESS: Today 09/07/22:  Shelly Blevins is a 77 y.o. female with a history of memory disturbance. Returns today for follow-up.  At the last visit we increased Aricept to 10 mg at bedtime.  She reports that she is tolerating this well.  She continues to live with her sister and grandson.  She is able to complete all ADLs independently.  Reports that she is sleeping well.  Her granddaughter manages her finances and appointments.  She states that she does not sleep well because her sister keeps her up talking.   02/23/22: Shelly Blevins is a 78 y.o. female who has been followed in this office for memory disturbance. Returns today for follow-up. Feels that memory is better. Lives with her grandson and sister. Able to complete all ADLs independently. Needs help with appointments. Manages her own medications but notices that she may forget. Granddaughter helps remind her about medication. Sleeps well. Reports that she may get agitated easy.  Currently taking Aricept 5 mg at bedtime and tolerating it well.   HISTORY Shelly Blevins is a 77 y.o. female here as requested by Jac Canavan, PA-C for memory. PMHx chronic gout, diabetic neuropathy, hypercholesterolemia, long-term use of insulin, decreased pulse(reviewed pulses over the last 2 years and have been normal), memory changes, hypothyroidism, coronary artery disease, memory changes.   She says she has had slowly progressive memory problems for years. Losing keys, losing her pocket book,she can put something up in a cabinet and forget where she puts it at. She lives with her grandchildren, and great  grand kids and she helps with caretaking and that can be stressful one has autism. No problems with driving or getting lost. She sometimes feel she repeats things, like her appointments her granddaughter helps her keep up with them. She keeps a calendar. "Sometimes" she may miss medications but her daughter reminds her. Patient puts her pills in a pill box but sometimes she makes mistakes. Daughter writes out the bills for her, sometimes she can write a check and feels anxious, she reports anxiety and depression. She has a lot of anxiety and depression; Stress at home with raising great grandchildren. She is sad some days. Her daughter died this week and that is making things worse. I recommend seeing psychiatry/therapy as guided by Crosby Oyster. Some days "I could dig a hole and get in". She has a sister who has been diagnosed with dementia, she is 2 years older and has forgotten her age unknown if Alzheimer's. She feels worse since she retired from [employer] a few years ago. She has episodes of confusion, possible altered mental status. No other focal neurologic deficits, associated symptoms, inciting events or modifiable factors.     Reviewed notes, labs and imaging from outside physicians, which showed:   05/28/2021: RPR NR, 04/28/2021 HIV NR, Hep c neg, b12 1081   MRI brain 01/21/2017: reviewed images and agree:  The brain parenchyma shows minimum age-appropriate periventricular and subcortical white matter hyperintensities from chronic small vessel disease as well as mild degree of generalized cerebral atrophy. No structural lesion, tumor or infarcts are  noted. Paranasal sinuses show mild chronic inflammatory changes.No abnormal lesions are seen on diffusion-weighted views to suggest acute ischemia. The cortical sulci, fissures and cisterns are normal in size and appearance. Lateral, third and fourth ventricle are normal in size and appearance. No extra-axial fluid collections are seen. No evidence  of mass effect or midline shift.  No abnormal lesions are seen on post contrast views.  On sagittal views the posterior fossa, pituitary gland and corpus callosum are unremarkable. No evidence of intracranial hemorrhage on gradient-echo views. The orbits and their contents, paranasal sinuses and calvarium are unremarkable.  Intracranial flow voids are present. Thin sections through the cerebellopontine angles show normal appearance of the VIII nerve complexes without any abnormal enhancement or tumors noted  REVIEW OF SYSTEMS: Out of a complete 14 system review of symptoms, the patient complains only of the following symptoms, and all other reviewed systems are negative.  ALLERGIES: No Known Allergies  HOME MEDICATIONS: Outpatient Medications Prior to Visit  Medication Sig Dispense Refill   acetaminophen (TYLENOL) 500 MG tablet Take 1,000 mg by mouth every 6 (six) hours as needed for mild pain or moderate pain.     albuterol (VENTOLIN HFA) 108 (90 Base) MCG/ACT inhaler Inhale 2 puffs into the lungs every 6 (six) hours as needed for wheezing or shortness of breath.     allopurinol (ZYLOPRIM) 100 MG tablet Take 1 tablet (100 mg total) by mouth daily. 90 tablet 1   amLODipine (NORVASC) 10 MG tablet Take 1 tablet (10 mg total) by mouth daily. 90 tablet 1   aspirin EC 81 MG tablet Take 1 tablet (81 mg total) by mouth daily. 90 tablet 3   atorvastatin (LIPITOR) 80 MG tablet Take 1 tablet (80 mg total) by mouth every evening. 90 tablet 1   BD PEN NEEDLE NANO 2ND GEN 32G X 4 MM MISC 2 (two) times daily. as directed     Continuous Glucose Receiver (FREESTYLE LIBRE 3 READER) DEVI 1 Device by Does not apply route daily. 1 each 0   Continuous Glucose Sensor (FREESTYLE LIBRE 3 SENSOR) MISC Place 1 sensor on the skin every 14 days. Use to check glucose continuously 2 each 1   donepezil (ARICEPT) 10 MG tablet Take 1 tablet (10 mg total) by mouth at bedtime. Helps slow down memory loss. (Patient taking  differently: Take 10 mg by mouth every evening.) 30 tablet 11   DULoxetine (CYMBALTA) 30 MG capsule Take 30 mg by mouth daily.     famotidine (PEPCID) 40 MG tablet Take 40 mg by mouth 2 (two) times daily.     Glucagon (GVOKE HYPOPEN 2-PACK) 1 MG/0.2ML SOAJ Inject 1 each into the skin as needed. 0.2 mL 1   insulin aspart (NOVOLOG) 100 UNIT/ML FlexPen Inject 0-6 Units into the skin 3 (three) times daily with meals. Use Sliding Scale per instructions provided. 15 mL 11   JARDIANCE 10 MG TABS tablet Take 10 mg by mouth daily.     latanoprost (XALATAN) 0.005 % ophthalmic solution Place 1 drop into both eyes at bedtime.  5   levothyroxine (SYNTHROID) 50 MCG tablet Take 1 tablet (50 mcg total) by mouth daily. 90 tablet 1   losartan (COZAAR) 50 MG tablet Take 50 mg by mouth daily.     metFORMIN (GLUCOPHAGE) 500 MG tablet Take 1,000 mg by mouth 2 (two) times daily.     NOVOLOG MIX 70/30 FLEXPEN (70-30) 100 UNIT/ML FlexPen Inject 5 Units into the skin 2 (two) times daily with a  meal. 60 units in the morning with breakfast, 10 units with lunch, 60 units in the evening with dinner. (Patient taking differently: Inject 5 Units into the skin 2 (two) times daily with a meal.) 15 mL 3   spironolactone (ALDACTONE) 25 MG tablet Take 0.5 tablets (12.5 mg total) by mouth daily. 45 tablet 1   No facility-administered medications prior to visit.    PAST MEDICAL HISTORY: Past Medical History:  Diagnosis Date   Allergy    Arthritis    CAD (coronary artery disease)    mild nonobstructive disease, 30% Cfx lesion   Cataract    Diabetes mellitus    insulin-dependent; Dr. Talmage Nap   Dyslipidemia    GERD (gastroesophageal reflux disease)    Hyperlipidemia    Hypertension    Hypothyroidism    Dr. Talmage Nap   Stroke [facility])     PAST SURGICAL HISTORY: Past Surgical History:  Procedure Laterality Date   ABDOMINAL HYSTERECTOMY     CARDIAC CATHETERIZATION  02/17/2007   mild CAD with 50% prox L Cfx stenosis, normal LV  function (Dr. Claudia Desanctis)   CHOLECYSTECTOMY     EYE SURGERY     NM MYOCAR PERF WALL MOTION  2009   lexiscan myoview - perfusion defect in anterior myociaral region (breast attenuation) - low risk scan - EF 69%   THYROIDECTOMY     TRANSTHORACIC ECHOCARDIOGRAM  2008   normal study     FAMILY HISTORY: Family History  Problem Relation Age of Onset   Heart failure Mother    Diabetes Mother    Dementia Sister    Heart failure Brother        died at 57   Colon cancer Neg Hx    Esophageal cancer Neg Hx    Stomach cancer Neg Hx    Rectal cancer Neg Hx    Alzheimer's disease Neg Hx     SOCIAL HISTORY: Social History   Socioeconomic History   Marital status: Married    Spouse name: Not on file   Number of children: 2   Years of education: 10   Highest education level: Not on file  Occupational History    Employer: [employer]  Tobacco Use   Smoking status: Former    Types: Cigarettes    Quit date: 04/02/1986    Years since quitting: 36.4   Smokeless tobacco: Never  Vaping Use   Vaping Use: Never used  Substance and Sexual Activity   Alcohol use: No   Drug use: No   Sexual activity: Not on file  Other Topics Concern   Not on file  Social History Narrative   Not on file   Social Determinants of Health   Financial Resource Strain: Low Risk  (09/19/2020)   Overall Financial Resource Strain (CARDIA)    Difficulty of Paying Living Expenses: Not very hard  Food Insecurity: No Food Insecurity (08/19/2022)   Hunger Vital Sign    Worried About Running Out of Food in the Last Year: Never true    Ran Out of Food in the Last Year: Never true  Transportation Needs: No Transportation Needs (08/19/2022)   PRAPARE - Administrator, Civil Service (Medical): No    Lack of Transportation (Non-Medical): No  Physical Activity: Not on file  Stress: Not on file  Social Connections: Not on file  Intimate Partner Violence: Not At Risk (08/19/2022)   Humiliation, Afraid,  Rape, and Kick questionnaire    Fear of Current or Ex-Partner:  No    Emotionally Abused: No    Physically Abused: No    Sexually Abused: No      PHYSICAL EXAM  Vitals:   09/07/22 0951  BP: (!) 125/54  Pulse: 67  Weight: 179 lb (81.2 kg)  Height: 5\' 2"  (1.575 m)   Body mass index is 32.74 kg/m.     09/07/2022    9:53 AM 02/23/2022    9:30 AM 08/05/2021   10:21 AM  MMSE - Mini Mental State Exam  Orientation to time 3 2 4   Orientation to Place 5 5 5   Registration 3 3 3   Attention/ Calculation 0 5 1  Recall 3 2 2   Language- name 2 objects 2 2 2   Language- repeat 1 0 0  Language- follow 3 step command 3 3 2   Language- read & follow direction 0 0 0  Write a sentence 0 1 0  Copy design 0 0 0  Total score 20 23 19      Generalized: Well developed, in no acute distress   Neurological examination  Mentation: Alert oriented to time, place, history taking. Follows all commands speech and language fluent Cranial nerve II-XII: Pupils were equal round reactive to light. Extraocular movements were full, visual field were full on confrontational test. Facial sensation and strength were normal. Head turning and shoulder shrug  were normal and symmetric. Motor: The motor testing reveals 5 over 5 strength of all 4 extremities. Good symmetric motor tone is noted throughout.  Sensory: Sensory testing is intact to soft touch on all 4 extremities. No evidence of extinction is noted.  Coordination: Cerebellar testing reveals good finger-nose-finger and heel-to-shin bilaterally.  Gait and station: Gait is normal.    DIAGNOSTIC DATA (LABS, IMAGING, TESTING) - I reviewed patient records, labs, notes, testing and imaging myself where available.  Lab Results  Component Value Date   WBC 10.0 08/20/2022   HGB 10.4 (L) 08/20/2022   HCT 32.2 (L) 08/20/2022   MCV 88.7 08/20/2022   PLT 240 08/20/2022      Component Value Date/Time   NA 135 08/20/2022 0627   NA 137 11/06/2021 1604   K 3.9  08/20/2022 0627   CL 100 08/20/2022 0627   CO2 25 08/20/2022 0627   GLUCOSE 127 (H) 08/20/2022 0627   BUN 17 08/20/2022 0627   BUN 18 11/06/2021 1604   CREATININE 1.13 (H) 08/20/2022 0627   CREATININE 0.90 11/02/2016 0934   CALCIUM 8.4 (L) 08/20/2022 0627   PROT 7.2 08/20/2022 0627   PROT 7.7 11/06/2021 1604   ALBUMIN 3.4 (L) 08/20/2022 0627   ALBUMIN 4.7 11/06/2021 1604   AST 42 (H) 08/20/2022 0627   ALT 26 08/20/2022 0627   ALKPHOS 62 08/20/2022 0627   BILITOT 0.6 08/20/2022 0627   BILITOT 0.3 11/06/2021 1604   GFRNONAA 50 (L) 08/20/2022 0627   GFRAA 66 04/25/2020 1151   Lab Results  Component Value Date   CHOL 129 04/28/2021   HDL 52 04/28/2021   LDLCALC 59 04/28/2021   TRIG 93 04/28/2021   CHOLHDL 2.5 04/28/2021   Lab Results  Component Value Date   HGBA1C 7.5 (H) 04/25/2020   Lab Results  Component Value Date   VITAMINB12 1,081 04/28/2021   Lab Results  Component Value Date   TSH 1.570 11/25/2020      ASSESSMENT AND PLAN 77 y.o. year old female  has a past medical history of Allergy, Arthritis, CAD (coronary artery disease), Cataract, Diabetes mellitus, Dyslipidemia, GERD (gastroesophageal reflux  disease), Hyperlipidemia, Hypertension, Hypothyroidism, and Stroke (HCC). here with :  1.  Memory disturbance  -Continue Aricept to 10 mg at bedtime -MMSE 20/30 previously 23/30 -Discussed adding on Namenda however she deferred for now - FU in 6-7 months or sooner if needed    Butch Penny, MSN, NP-C 09/07/2022, 10:12 AM [facility] [address], [address] [place], [place] [address] [phone]

## 2022-09-07 NOTE — Progress Notes (Unsigned)
Care Management & Coordination Services Pharmacy Note  09/07/2022 Name:  Shelly Blevins MRN:  [id number] DOB:  [birth year]  Summary: ***  Recommendations/Changes made from today's visit: ***  Follow up plan: ***   Subjective: Shelly Blevins is an 77 y.o. year old female who is a primary patient of Genia Del.  The care coordination team was consulted for assistance with disease management and care coordination needs.    {CCMTELEPHONEFACETOFACE:21091510} for initial visit.  Recent office visits: 09/03/22 Tysinger, Kermit Balo, PA-C - Patient presented for Medication management and other concerns. No medication changes.   04/29/22 Tysinger, Kermit Balo, PA-C - Clinical support encounter for health maintenance examination in an adult and other concerns. Stopped Cholecalciferol. Stopped Prednisone.  Recent consult visits: 09/07/22 Renelda Loma, NP (Neuro) - For memory loss. No med changes. Recommend adding on namenda, declined by patient.  09/02/22 Milinda Pointer, PT - Patient presented for Muscle weakness and other concerns. No medication changes.   08/18/22 Mickey Farber, PA - [facility] and other concerns.  No medication changes.   06/12/22 Lenna Gilford, [facility]  -Phone visit with [facility].    04/30/22 Binnie Rail, PsyD (Neuropsychology)  - Patient presented for Mild dementia with agitation and other concerns. No medication changes.  Hospital visits: 08/19/22 [facility] - For hypoglycemia associated with diabetes, LOS 2 days. START Novolog. Changed Metformin and Novolog Mix. STOP Zetia   Objective:  Lab Results  Component Value Date   CREATININE 1.13 (H) 08/20/2022   BUN 17 08/20/2022   EGFR 57 (L) 11/06/2021   GFRNONAA 50 (L) 08/20/2022   GFRAA 66 04/25/2020   NA 135 08/20/2022   K 3.9 08/20/2022   CALCIUM 8.4 (L) 08/20/2022   CO2 25 08/20/2022   GLUCOSE 127 (H) 08/20/2022     Lab Results  Component Value Date/Time   HGBA1C 7.5 (H) 04/25/2020 11:51 AM   HGBA1C 8.1 (H) 08/17/2019 08:10 AM    Last diabetic Eye exam:  Lab Results  Component Value Date/Time   HMDIABEYEEXA No Retinopathy 03/06/2022 12:00 AM    Last diabetic Foot exam: No results found for: "HMDIABFOOTEX"   Lab Results  Component Value Date   CHOL 129 04/28/2021   HDL 52 04/28/2021   LDLCALC 59 04/28/2021   TRIG 93 04/28/2021   CHOLHDL 2.5 04/28/2021       Latest Ref Rng & Units 08/20/2022    6:27 AM 08/19/2022   12:34 PM 11/06/2021    4:04 PM  Hepatic Function  Total Protein 6.5 - 8.1 g/dL 7.2  8.0  7.7   Albumin 3.5 - 5.0 g/dL 3.4  4.0  4.7   AST 15 - 41 U/L 42  56  27   ALT 0 - 44 U/L 26  27  27    Alk Phosphatase 38 - 126 U/L 62  70  68   Total Bilirubin 0.3 - 1.2 mg/dL 0.6  0.8  0.3     Lab Results  Component Value Date/Time   TSH 1.570 11/25/2020 11:56 AM   TSH 1.460 04/25/2020 11:51 AM   FREET4 0.89 03/28/2019 10:25 AM   FREET4 0.9 11/02/2016 09:34 AM       Latest Ref Rng & Units 08/20/2022    6:27 AM 08/19/2022   12:34 PM 04/29/2022    8:59 AM  CBC  WBC 4.0 - 10.5 K/uL 10.0  10.4  7.5  Hemoglobin 12.0 - 15.0 g/dL 16.1  09.6  04.5   Hematocrit 36.0 - 46.0 % 32.2  36.3  32.1   Platelets 150 - 400 K/uL 240  237  265     Lab Results  Component Value Date/Time   VD25OH 84.6 04/29/2022 08:59 AM   VD25OH 32.1 03/28/2019 10:25 AM   VITAMINB12 1,081 04/28/2021 09:12 AM   VITAMINB12 617 11/02/2016 09:34 AM    Clinical ASCVD: Yes  The ASCVD Risk score (Arnett DK, et al., 2019) failed to calculate for the following reasons:   The patient has a prior MI or stroke diagnosis    DEXA - 09/17/20 - Normal BMD     04/29/2022    8:27 AM 04/28/2021    8:47 AM 04/28/2021    8:38 AM  Depression screen PHQ 2/9  Decreased Interest 0 0 0  Down, Depressed, Hopeless 0 0 0  PHQ - 2 Score 0 0 0  Altered sleeping  0   Tired, decreased energy  0   Change in appetite  0    Feeling bad or failure about yourself   1   Trouble concentrating  0   Moving slowly or fidgety/restless  0   Suicidal thoughts  0   PHQ-9 Score  1   Difficult doing work/chores  Not difficult at all      Social History   Tobacco Use  Smoking Status Former   Types: Cigarettes   Quit date: 04/02/1986   Years since quitting: 36.4  Smokeless Tobacco Never   BP Readings from Last 3 Encounters:  09/07/22 (!) 125/54  09/03/22 110/70  08/21/22 (!) 151/62   Pulse Readings from Last 3 Encounters:  09/07/22 67  09/03/22 65  08/21/22 (!) 53   Wt Readings from Last 3 Encounters:  09/07/22 179 lb (81.2 kg)  09/03/22 178 lb (80.7 kg)  08/19/22 185 lb (83.9 kg)   BMI Readings from Last 3 Encounters:  09/07/22 32.74 kg/m  09/03/22 32.56 kg/m  08/19/22 33.84 kg/m    No Known Allergies  Medications Reviewed Today     Reviewed by Hettie Holstein, CMA (Certified Medical Assistant) on 09/07/22 at 0950  Med List Status: <None>   Medication Order Taking? Sig Documenting Provider Last Dose Status Informant  acetaminophen (TYLENOL) 500 MG tablet [id number] Yes Take 1,000 mg by mouth every 6 (six) hours as needed for mild pain or moderate pain. [provider] Taking Active Self, Pharmacy Records  albuterol (VENTOLIN HFA) 108 (90 Base) MCG/ACT inhaler [id number] Yes Inhale 2 puffs into the lungs every 6 (six) hours as needed for wheezing or shortness of breath. [provider] Taking Active Self, Pharmacy Records  allopurinol (ZYLOPRIM) 100 MG tablet [id number] Yes Take 1 tablet (100 mg total) by mouth daily. Tysinger, Kermit Balo, PA-C Taking Active   amLODipine (NORVASC) 10 MG tablet [id number] Yes Take 1 tablet (10 mg total) by mouth daily. Tysinger, Kermit Balo, PA-C Taking Active   aspirin EC 81 MG tablet [id number] Yes Take 1 tablet (81 mg total) by mouth daily. Tysinger, Kermit Balo, PA-C Taking Active   atorvastatin (LIPITOR) 80 MG tablet [id number] Yes Take 1 tablet (80 mg total)  by mouth every evening. Tysinger, Kermit Balo, PA-C Taking Active   BD PEN NEEDLE NANO 2ND GEN 32G X 4 MM MISC [id number] Yes 2 (two) times daily. as directed [provider] Taking Active Self, Pharmacy Records  Continuous Glucose Receiver (FREESTYLE LIBRE 3 READER) [place] [id number] Yes 1 Device  by Does not apply route daily. Tysinger, Kermit Balo, PA-C Taking Active   Continuous Glucose Sensor (FREESTYLE LIBRE 3 SENSOR) [place] [id number] Yes Place 1 sensor on the skin every 14 days. Use to check glucose continuously Tysinger, Kermit Balo, PA-C Taking Active   donepezil (ARICEPT) 10 MG tablet [id number] Yes Take 1 tablet (10 mg total) by mouth at bedtime. Helps slow down memory loss.  Patient taking differently: Take 10 mg by mouth every evening.   Butch Penny, NP Taking Active Self, Pharmacy Records  DULoxetine (CYMBALTA) 30 MG capsule [id number] Yes Take 30 mg by mouth daily. [provider] Taking Active Self, Pharmacy Records           Med Note (COFFELL, Marzella Schlein   Fri Aug 21, 2022  3:02 PM) Replacing gabapentin.  famotidine (PEPCID) 40 MG tablet [id number] Yes Take 40 mg by mouth 2 (two) times daily. [provider] Taking Active Self, Pharmacy Records  Glucagon (GVOKE HYPOPEN 2-PACK) 1 MG/0.2ML SOAJ [id number] Yes Inject 1 each into the skin as needed. Tysinger, Kermit Balo, PA-C Taking Active Self, Pharmacy Records           Med Note Jola Schmidt   Fri Aug 21, 2022  8:43 AM)    insulin aspart (NOVOLOG) 100 UNIT/ML FlexPen [id number] Yes Inject 0-6 Units into the skin 3 (three) times daily with meals. Use Sliding Scale per instructions provided. Pennie Banter, DO Taking Active            Med Note Laural Benes, [facility]   Thu Sep 03, 2022 11:29 AM)    JARDIANCE 10 MG TABS tablet [id number] Yes Take 10 mg by mouth daily. [provider] Taking Active Self, Pharmacy Records  latanoprost (XALATAN) 0.005 % ophthalmic solution [id number] Yes Place 1 drop into both eyes at  bedtime. [provider] Taking Active Self, Pharmacy Records  levothyroxine (SYNTHROID) 50 MCG tablet [id number] Yes Take 1 tablet (50 mcg total) by mouth daily. Tysinger, Kermit Balo, PA-C Taking Active   losartan (COZAAR) 50 MG tablet [id number] Yes Take 50 mg by mouth daily. [provider] Taking Active Self, Pharmacy Records  metFORMIN (GLUCOPHAGE) 500 MG tablet [id number] Yes Take 1,000 mg by mouth 2 (two) times daily. [provider] Taking Active Self, Pharmacy Records  NOVOLOG MIX 70/30 FLEXPEN (70-30) 100 UNIT/ML FlexPen [id number] Yes Inject 5 Units into the skin 2 (two) times daily with a meal. 60 units in the morning with breakfast, 10 units with lunch, 60 units in the evening with dinner.  Patient taking differently: Inject 5 Units into the skin 2 (two) times daily with a meal.   Pennie Banter, DO Taking Active   spironolactone (ALDACTONE) 25 MG tablet [id number] Yes Take 0.5 tablets (12.5 mg total) by mouth daily. Tysinger, Kermit Balo, PA-C Taking Active             SDOH:  (Social Determinants of Health) assessments and interventions performed: Yes SDOH Interventions    Flowsheet Row Clinical Support from 04/28/2021 in [facility] Office Visit from 11/25/2020 in [facility] Telephone from 09/19/2020 in [facility]  SDOH Interventions     Food Insecurity Interventions -- -- Intervention Not Indicated  Housing Interventions -- -- Intervention Not Indicated  Transportation Interventions -- -- Intervention Not Indicated  Depression Interventions/Treatment  PHQ2-9 Score <4 Follow-up Not Indicated Counseling --  Financial Strain Interventions -- -- Other (Comment)  [Extra Help application completed]       Medication  Assistance: {MEDASSISTANCEINFO:25044}  Medication Access: Name and location of current pharmacy:  [facility] - [place], [place] - [address] [place]  [address] Phone: [phone] Fax: [phone]  Within the past 30 days, how often has patient missed a dose of medication? *** Is a pillbox or other method used to improve adherence? {YES/NO:21197} Factors that may affect medication adherence? {CHL DESC; BARRIERS:21522} Are meds synced by current pharmacy? {YES/NO:21197} Are meds delivered by current pharmacy? {YES/NO:21197} Does patient experience delays in picking up medications due to transportation concerns? {YES/NO:21197}  Compliance/Adherence/Medication fill history: Care Gaps: COVID Booster - Overdue Zoster Vaccine - Overdue AWV - 04/29/22  Star-Rating Drugs: Atorvastatin 80 mg - Last filled 07/16/22 90 DS at [facility] Losartan 50 mg  -Last filled 08/14/22 30 DS at [facility] Metformin 500 mg - Last filled 08/10/22 30 DS at [facility] [place] Novolog Flex pen 70/30 - Last filled 08/31/22 108 DS at [facility]   Assessment/Plan Hypertension (BP goal <140/90) -Controlled -Current treatment: Amlodipine 10mg  1 qd Losartan 50mg  1 qd Spironolactone 25mg  1/2 tab qd -Medications previously tried: HCTZ, Ramipril  -Current home readings: *** -Current dietary habits: *** -Current exercise habits: *** -{ACTIONS;DENIES/REPORTS:21021675::"Denies"} hypotensive/hypertensive symptoms -Educated on {CCM BP Counseling:25124} -Counseled to monitor BP at home ***, document, and provide log at future appointments -{CCMPHARMDINTERVENTION:25122}  Hyperlipidemia: (LDL goal < 70) -Controlled -Current treatment: Atorvastatin 80mg  1 qd -Medications previously tried: zetia  -Current dietary patterns: *** -Current exercise habits: *** -Educated on {CCM HLD Counseling:25126} -{CCMPHARMDINTERVENTION:25122}  Diabetes (A1c goal <7%) -Uncontrolled -Current medications: Novolog 0-6 units TID with meals, SS Novolog mix 70/30 5 units BID with a meal?? Jardiance 10mg  1 qd Metformin 500mg  2 tabs BID Glucagon pen prn -Medications previously tried: ***  -Current  home glucose readings fasting glucose: *** post prandial glucose: *** -{ACTIONS;DENIES/REPORTS:21021675::"Denies"} hypoglycemic/hyperglycemic symptoms -Current meal patterns:  breakfast: ***  lunch: ***  dinner: *** snacks: *** drinks: *** -Current exercise: *** -Educated on {CCM DM COUNSELING:25123} -Counseled to check feet daily and get yearly eye exams -{CCMPHARMDINTERVENTION:25122}  Hypothyroidism (Goal: TSH WNL) -Not assessed today -Current treatment  Levothyroxine 1 qd  CAD (Goal: Slow progression of atherosclerosis (plaques / blockages) throughout your body to reduce risk of heart attack and strokes) -Not assessed today Current Medication Therapy: Aspirin 81mg  EC 1 qd  Memory Loss (Goal: Slow the progression of memory loss) -Not assessed today -Current treatment  Donepezil 10mg  1qd  Query Depression/Neuropathy  -Current treatment  Cymbalta 30mg  1 qd  GERD (Goal: minimize symptoms of reflux ) -Not assessed today -Current treatment  Famotidine 40mg  1 BID  Gout (Goal: Prevent gout flares) -Not assessed today -Current treatment  Allopurinol 100mg  1 qd    Sherrill Raring Clinical Pharmacist [phone]

## 2022-09-07 NOTE — Patient Instructions (Signed)
Your Plan:  Continue Aricept  Consider adding Namenda in the future     Thank you for coming to see [place] at [facility]. I hope we have been able to provide you high quality care today.  You may receive a patient satisfaction survey over the next few weeks. We would appreciate your feedback and comments so that we may continue to improve ourselves and the health of our patients.

## 2022-09-08 ENCOUNTER — Encounter: Payer: Self-pay | Admitting: Physical Therapy

## 2022-09-08 ENCOUNTER — Ambulatory Visit: Payer: Medicare HMO | Admitting: Physical Therapy

## 2022-09-08 DIAGNOSIS — M6281 Muscle weakness (generalized): Secondary | ICD-10-CM

## 2022-09-08 DIAGNOSIS — E1140 Type 2 diabetes mellitus with diabetic neuropathy, unspecified: Secondary | ICD-10-CM | POA: Diagnosis not present

## 2022-09-08 DIAGNOSIS — M1990 Unspecified osteoarthritis, unspecified site: Secondary | ICD-10-CM | POA: Diagnosis not present

## 2022-09-08 DIAGNOSIS — I10 Essential (primary) hypertension: Secondary | ICD-10-CM | POA: Diagnosis not present

## 2022-09-08 DIAGNOSIS — R29898 Other symptoms and signs involving the musculoskeletal system: Secondary | ICD-10-CM

## 2022-09-08 DIAGNOSIS — R42 Dizziness and giddiness: Secondary | ICD-10-CM

## 2022-09-08 DIAGNOSIS — E7800 Pure hypercholesterolemia, unspecified: Secondary | ICD-10-CM | POA: Diagnosis not present

## 2022-09-08 DIAGNOSIS — R55 Syncope and collapse: Secondary | ICD-10-CM | POA: Diagnosis not present

## 2022-09-08 DIAGNOSIS — M2550 Pain in unspecified joint: Secondary | ICD-10-CM | POA: Diagnosis not present

## 2022-09-08 DIAGNOSIS — R2681 Unsteadiness on feet: Secondary | ICD-10-CM

## 2022-09-08 DIAGNOSIS — R768 Other specified abnormal immunological findings in serum: Secondary | ICD-10-CM | POA: Diagnosis not present

## 2022-09-08 DIAGNOSIS — M359 Systemic involvement of connective tissue, unspecified: Secondary | ICD-10-CM | POA: Diagnosis not present

## 2022-09-08 DIAGNOSIS — E119 Type 2 diabetes mellitus without complications: Secondary | ICD-10-CM | POA: Diagnosis not present

## 2022-09-08 DIAGNOSIS — M109 Gout, unspecified: Secondary | ICD-10-CM | POA: Diagnosis not present

## 2022-09-08 NOTE — Patient Instructions (Addendum)
You Can Walk For A Certain Length Of Time Each Day                          Walk 5 minutes 1-2 times per day.             Increase 1-2  minutes every 7 days              Work up to 10 minutes (1-2 times per day).               Example:                         Day 1-2           4-5 minutes     3 times per day                         Day 7-8           10-12 minutes 2-3 times per day                         Day 13-14       20-22 minutes 1-2 times per day  Access Code: [email or web address] URL: [email or web address] Date: 09/08/2022 Prepared by: Camille Bal  Exercises - Sit to Stand with Arms Crossed  - 1 x daily - 4-5 x weekly - 2 sets - 10 reps - Side Stepping with Resistance at Thighs and Counter Support  - 1 x daily - 4 x weekly - 3-4 sets - 10 reps - Seated March with Resistance  - 1 x daily - 5 x weekly - 2 sets - 20 reps - Seated Hamstring Curls with Resistance  - 1 x daily - 5 x weekly - 2 sets - 10 reps

## 2022-09-08 NOTE — Therapy (Signed)
OUTPATIENT PHYSICAL THERAPY NEURO TREATMENT   Patient Name: Shelly Blevins MRN: [id number] DOB:[birth year], 77 y.o., female Today's Date: 09/08/2022   PCP: Benard Rink  REFERRING PROVIDER: Lanae Boast, MD  END OF SESSION:  PT End of Session - 09/08/22 0931     Visit Number 2    Number of Visits 17    Date for PT Re-Evaluation 10/28/22    Authorization Type [employer] MCR    Authorization Time Period 09/02/22 to 10/28/22    Progress Note Due on Visit 10    PT Start Time 0931    PT Stop Time 1014    PT Time Calculation (min) 43 min    Equipment Utilized During Treatment Gait belt    Activity Tolerance Patient tolerated treatment well;Patient limited by fatigue    Behavior During Therapy WFL for tasks assessed/performed             Past Medical History:  Diagnosis Date   Allergy    Arthritis    CAD (coronary artery disease)    mild nonobstructive disease, 30% Cfx lesion   Cataract    Diabetes mellitus    insulin-dependent; Dr. Talmage Nap   Dyslipidemia    GERD (gastroesophageal reflux disease)    Hyperlipidemia    Hypertension    Hypothyroidism    Dr. Talmage Nap   Stroke [facility])    Past Surgical History:  Procedure Laterality Date   ABDOMINAL HYSTERECTOMY     CARDIAC CATHETERIZATION  02/17/2007   mild CAD with 50% prox L Cfx stenosis, normal LV function (Dr. Claudia Desanctis)   CHOLECYSTECTOMY     EYE SURGERY     NM MYOCAR PERF WALL MOTION  2009   lexiscan myoview - perfusion defect in anterior myociaral region (breast attenuation) - low risk scan - EF 69%   THYROIDECTOMY     TRANSTHORACIC ECHOCARDIOGRAM  2008   normal study    Patient Active Problem List   Diagnosis Date Noted   Medication management 09/03/2022   Hospital discharge follow-up 09/03/2022   Hypercholesterolemia 09/03/2022   History of gout 09/03/2022   Fall 08/19/2022   Hypoglycemia associated with diabetes (HCC) 08/19/2022   Near syncope 08/19/2022   Elevated uric acid in blood 05/12/2021    Chronic gout without tophus 05/12/2021   High risk medication use 05/12/2021   Anemia 01/23/2021   Diabetic neuropathy (HCC) 01/15/2021   Long term (current) use of insulin (HCC) 01/15/2021   Diabetes mellitus (HCC) 12/31/2020   Hypertrophy of nail 12/31/2020   Pain in joints of both feet 12/31/2020   Abnormal ankle brachial index (ABI) 12/31/2020   Encounter for health maintenance examination in adult 04/25/2020   Medicare annual wellness visit, subsequent 04/25/2020   Memory change 04/25/2020   Post-menopausal 04/25/2020   Constipation 02/29/2020   Osteoarthritis of both knees 01/10/2020   Chronic pain of both knees 03/28/2019   Vitamin D deficiency 03/28/2019   Ataxia 11/02/2016   Estrogen deficiency 11/02/2016   Vaccine counseling 10/09/2015   Advanced directives, counseling/discussion 10/09/2015   Need for prophylactic vaccination against Streptococcus pneumoniae (pneumococcus) 10/09/2015   Hypothyroidism 04/17/2015   Essential hypertension 06/06/2013   Dyslipidemia 06/06/2013   Coronary artery disease due to lipid rich plaque 06/06/2013   GERD 10/29/2008    ONSET DATE: 08/19/22  REFERRING DIAG: R55 (ICD-10-CM) - Near syncope  THERAPY DIAG:  Muscle weakness (generalized)  Unsteadiness on feet  Other symptoms and signs involving the musculoskeletal system  Dizziness and giddiness  Rationale for Evaluation  and Treatment: Rehabilitation  SUBJECTIVE:                                                                                                                                                                                             SUBJECTIVE STATEMENT: Pt states she feels stiff in her shoulders and knees today.  She denies falls since evaluation or acute changes.   Pt accompanied by:  grand-daughter   PERTINENT HISTORY: OA, CAD, DM, HLD, HTN, hypothyroidism, CVA, hx cardiac cath, hx cholecystectomy  PAIN:  Are you having pain? Yes: NPRS scale: 8/10 Pain  location: shoulders and knees bilaterally Pain description: stiffness Aggravating factors: being cold  Relieving factors: heat   PRECAUTIONS: Fall  WEIGHT BEARING RESTRICTIONS: No  FALLS: Has patient fallen in last 6 months? Yes. Number of falls 2- fall that brought her to the ED, slipped off EOB to the floor on another occasion, no FOF   LIVING ENVIRONMENT: Lives with: lives with their family Lives in: House/apartment Stairs: ramped entrance, no steps inside home  Has following equipment at home: shower chair, Grab bars, and Ramped entry  PLOF: Independent, Independent with basic ADLs, Independent with gait, and Independent with transfers  PATIENT GOALS: be able to walk again "not a long ways but be able to walk about a block"   OBJECTIVE:   DIAGNOSTIC FINDINGS: Right hand wrist elbow and shoulder pain swelling x 1 wk [facility] -[facility], worried abt steroid use due to her Dm per family, xray and blood worse were done there- was told it is from Rheumatoid arthritis-she will need to follow-up with rheumatology outpatient Added scheduled Tylenol 1000 mg, ibuprofen as needed  PT OT evaluations - need rehab needs.  IMPRESSION: 1.  No evidence of an acute intracranial abnormality. 2. Mild chronic small vessel ischemic changes within the cerebral white matter. 3. Mild generalized parenchymal atrophy. 4. Bilateral proptosis.   Left ankle pain and numbness- now mostly pain Hx of gout: on allupurinol.  COGNITION: Overall cognitive status: Within functional limits for tasks assessed   SENSATION: Not tested     LOWER EXTREMITY MMT:    MMT Right Eval Left Eval  Hip flexion 3 3  Hip extension    Hip abduction 3 at best (tested in sitting due to time limits of session) 3 at best (tested in sitting due to time limits of session)  Hip adduction    Hip internal rotation    Hip external rotation    Knee flexion    Knee extension 4+ 4+  Ankle dorsiflexion 3-  painful  3- painful   Ankle plantarflexion  Ankle inversion    Ankle eversion    (Blank rows = not tested)      GAIT: Gait pattern: step through pattern, decreased arm swing- Right, decreased arm swing- Left, decreased step length- Right, decreased step length- Left, decreased stance time- Right, decreased stance time- Left, decreased stride length, decreased ankle dorsiflexion- Right, decreased ankle dorsiflexion- Left, trendelenburg, decreased trunk rotation, trunk flexed, and narrow BOS Distance walked: in clinic distances  Assistive device utilized: None Level of assistance: Complete Independence Comments: very slow but steady on even surface without obstacles   FUNCTIONAL TESTS:  Berg Balance Scale: 35/56  VESTIBULAR TESTING  Oculomotor alignment WNL Eye ROM WNL, no nystagmus noted at rest Visual tracking WNL, eyes did come off of target occasionally but question if this was due to cognition/attention Horizontal and vertical head shakes WNL VOR cancellation WNL but very limited due to trunk/shoulder stiffness  Head Impulse Test negative B  Dix Hallpike negative B Roll test for horizontal canal B     PATIENT SURVEYS:  FOTO 42, predicted 47 in 10 visits   TODAY'S TREATMENT:                                                                                                                              DATE: 09/08/2022 - no AD:  9.34 seconds = 1.07 m/sec OR 3.53 ft/sec; forward lean noted w/ some drift right from pathway and with turns to right when returning to mat - :  240' intermittent CGA -You Can Walk For A Certain Length Of Time Each Day                          Walk 5 minutes 1-2 times per day.             Increase 1-2  minutes every 7 days              Work up to 10 minutes (1-2 times per day).               Example:                         Day 1-2           4-5 minutes     3 times per day                         Day 7-8           10-12 minutes 2-3 times  per day                         Day 13-14       20-22 minutes 1-2 times per day  -STS no UE support 2x10 -Side stepping w/ red resistance band around thighs 4x10' each direction, cues for increased amplitude of steps w/  left foot clearance -Seated marching w/ resistance band (standing activities irritated knees) 2x20 -Seated hamstring curls 2x10 alternating LE w/ red resistance band around ankles, return demo and moderate cues to initiate task correctly.  PATIENT EDUCATION: Education details:  Outcome interpretations and goals set/revised.  Initial walking program and how to progress with parameters as written.  Initial HEP. Person educated: Patient Education method: Explanation Education comprehension: verbalized understanding, returned demonstration, and needs further education  HOME EXERCISE PROGRAM: You Can Walk For A Certain Length Of Time Each Day                          Walk 5 minutes 1-2 times per day.             Increase 1-2  minutes every 7 days              Work up to 10 minutes (1-2 times per day).               Example:                         Day 1-2           4-5 minutes     3 times per day                         Day 7-8           10-12 minutes 2-3 times per day                         Day 13-14       20-22 minutes 1-2 times per day  Access Code: [email or web address] URL: [email or web address] Date: 09/08/2022 Prepared by: Camille Bal  Exercises - Sit to Stand with Arms Crossed  - 1 x daily - 4-5 x weekly - 2 sets - 10 reps - Side Stepping with Resistance at Thighs and Counter Support  - 1 x daily - 4 x weekly - 3-4 sets - 10 reps - Seated March with Resistance  - 1 x daily - 5 x weekly - 2 sets - 20 reps - Seated Hamstring Curls with Resistance  - 1 x daily - 5 x weekly - 2 sets - 10 reps  GOALS: Goals reviewed with patient? Yes  SHORT TERM GOALS: Target date: 09/30/2022    Will be compliant with appropriate progressive HEP with MinA from family   Baseline: Goal status: INITIAL  2.  Subjective complaints of whole body stiffness to have improved by 50%  Baseline:  Goal status: INITIAL  3.  Pt and family to be able to verbalize 3 ways to reduce fall risk at home and in the community  Baseline:  Goal status: INITIAL  4.  Will be compliant with daily 10 minute walking program with family in order to improve general functional activity tolerance and complaints of fatigue  Baseline:  Goal status: INITIAL    LONG TERM GOALS: Target date: 10/28/2022    MMT to improve by at least 1 grade in all weak groups  Baseline:  Goal status: INITIAL  2.  Will score at least 43 on Berg balance test in order to show reduced fall risk Baseline:  Goal status: INITIAL  3.  Will be able to ambulate at least 426ft in with LRAD in order to improve community access  Baseline: 240'  no AD (6/11) Goal status: REVISED-6/11 as no baseline present from evaluation.  4.  Will be compliant with appropriate advanced HEP with MinA from family in addition to regular advanced walking program (20-30 minutes at least 3x/week) in order to maintain functional gains from PT  Baseline:  Goal status: INITIAL    ASSESSMENT:  CLINICAL IMPRESSION: Assessed and today for assessment and goal setting as baseline not present for from evaluation so addended goal this visit.  She demonstrates significant activity intolerance and decreased speed and amplitude of upright mobility.  She also has some difficulty with task initiation with need for increased processing time following instructions.  She denies dizziness or motion sensitivity throughout activities.  Initiated HEP focused on functional LE strengthening to support upright stability and muscular endurance.  Will pivot to static and dynamic balance interventions at next session.  OBJECTIVE IMPAIRMENTS: Abnormal gait, decreased activity tolerance, decreased balance, decreased cognition, decreased  coordination, decreased mobility, difficulty walking, decreased ROM, decreased strength, hypomobility, impaired perceived functional ability, and pain.   ACTIVITY LIMITATIONS: standing, squatting, transfers, bed mobility, and locomotion level  PARTICIPATION LIMITATIONS: cleaning, laundry, shopping, community activity, and church  PERSONAL FACTORS: Age, Behavior pattern, Education, Fitness, Past/current experiences, Social background, and Time since onset of injury/illness/exacerbation are also affecting patient's functional outcome.   REHAB POTENTIAL: Good  CLINICAL DECISION MAKING: Stable/uncomplicated  EVALUATION COMPLEXITY: Low  PLAN:  PT FREQUENCY: 2x/week  PT DURATION: 8 weeks  PLANNED INTERVENTIONS: Therapeutic exercises, Therapeutic activity, Neuromuscular re-education, Gait training, Self Care,  and Re-evaluation  PLAN FOR NEXT SESSION: monitor reports of vertigo and re-examine PRN. Otherwise focus on functional strength and balance, functional activity tolerance. Add to HEP for static/dynamic balance.  Camille Bal, PT, DPT

## 2022-09-10 ENCOUNTER — Encounter: Payer: Self-pay | Admitting: Physical Therapy

## 2022-09-10 ENCOUNTER — Ambulatory Visit: Payer: Medicare HMO | Admitting: Physical Therapy

## 2022-09-10 ENCOUNTER — Other Ambulatory Visit: Payer: HMO

## 2022-09-10 VITALS — BP 147/60 | HR 64

## 2022-09-10 DIAGNOSIS — M6281 Muscle weakness (generalized): Secondary | ICD-10-CM | POA: Diagnosis not present

## 2022-09-10 DIAGNOSIS — R55 Syncope and collapse: Secondary | ICD-10-CM | POA: Diagnosis not present

## 2022-09-10 DIAGNOSIS — R42 Dizziness and giddiness: Secondary | ICD-10-CM

## 2022-09-10 DIAGNOSIS — R2681 Unsteadiness on feet: Secondary | ICD-10-CM | POA: Diagnosis not present

## 2022-09-10 DIAGNOSIS — R29898 Other symptoms and signs involving the musculoskeletal system: Secondary | ICD-10-CM

## 2022-09-10 NOTE — Therapy (Signed)
OUTPATIENT PHYSICAL THERAPY NEURO TREATMENT   Patient Name: Shelly Blevins MRN: [id number] DOB:[birth year], 77 y.o., female Today's Date: 09/10/2022   PCP: Benard Rink  REFERRING PROVIDER: Lanae Boast, MD  END OF SESSION:  PT End of Session - 09/10/22 1103     Visit Number 3    Number of Visits 17    Date for PT Re-Evaluation 10/28/22    Authorization Type [employer] MCR    Authorization Time Period 09/02/22 to 10/28/22    Progress Note Due on Visit 10    PT Start Time 1100    PT Stop Time 1145    PT Time Calculation (min) 45 min    Equipment Utilized During Treatment Gait belt    Activity Tolerance Patient tolerated treatment well    Behavior During Therapy WFL for tasks assessed/performed             Past Medical History:  Diagnosis Date   Allergy    Arthritis    CAD (coronary artery disease)    mild nonobstructive disease, 30% Cfx lesion   Cataract    Diabetes mellitus    insulin-dependent; Dr. Talmage Nap   Dyslipidemia    GERD (gastroesophageal reflux disease)    Hyperlipidemia    Hypertension    Hypothyroidism    Dr. Talmage Nap   Stroke [facility])    Past Surgical History:  Procedure Laterality Date   ABDOMINAL HYSTERECTOMY     CARDIAC CATHETERIZATION  02/17/2007   mild CAD with 50% prox L Cfx stenosis, normal LV function (Dr. Claudia Desanctis)   CHOLECYSTECTOMY     EYE SURGERY     NM MYOCAR PERF WALL MOTION  2009   lexiscan myoview - perfusion defect in anterior myociaral region (breast attenuation) - low risk scan - EF 69%   THYROIDECTOMY     TRANSTHORACIC ECHOCARDIOGRAM  2008   normal study    Patient Active Problem List   Diagnosis Date Noted   Medication management 09/03/2022   Hospital discharge follow-up 09/03/2022   Hypercholesterolemia 09/03/2022   History of gout 09/03/2022   Fall 08/19/2022   Hypoglycemia associated with diabetes (HCC) 08/19/2022   Near syncope 08/19/2022   Elevated uric acid in blood 05/12/2021   Chronic gout without tophus  05/12/2021   High risk medication use 05/12/2021   Anemia 01/23/2021   Diabetic neuropathy (HCC) 01/15/2021   Long term (current) use of insulin (HCC) 01/15/2021   Diabetes mellitus (HCC) 12/31/2020   Hypertrophy of nail 12/31/2020   Pain in joints of both feet 12/31/2020   Abnormal ankle brachial index (ABI) 12/31/2020   Encounter for health maintenance examination in adult 04/25/2020   Medicare annual wellness visit, subsequent 04/25/2020   Memory change 04/25/2020   Post-menopausal 04/25/2020   Constipation 02/29/2020   Osteoarthritis of both knees 01/10/2020   Chronic pain of both knees 03/28/2019   Vitamin D deficiency 03/28/2019   Ataxia 11/02/2016   Estrogen deficiency 11/02/2016   Vaccine counseling 10/09/2015   Advanced directives, counseling/discussion 10/09/2015   Need for prophylactic vaccination against Streptococcus pneumoniae (pneumococcus) 10/09/2015   Hypothyroidism 04/17/2015   Essential hypertension 06/06/2013   Dyslipidemia 06/06/2013   Coronary artery disease due to lipid rich plaque 06/06/2013   GERD 10/29/2008    ONSET DATE: 08/19/22  REFERRING DIAG: R55 (ICD-10-CM) - Near syncope  THERAPY DIAG:  Muscle weakness (generalized)  Unsteadiness on feet  Other symptoms and signs involving the musculoskeletal system  Dizziness and giddiness  Rationale for Evaluation and Treatment: Rehabilitation  SUBJECTIVE:                                                                                                                                                                                             SUBJECTIVE STATEMENT: Pt states she has mild pain in her R knee. She reports one near fall last night when trying to fix her granddaughter's bed and got tripped up on her granddaughter. Patient states that she did not hit her head.   Pt accompanied by:  grand-daughter   PERTINENT HISTORY: OA, CAD, DM, HLD, HTN, hypothyroidism, CVA, hx cardiac cath, hx  cholecystectomy  PAIN:  Are you having pain? Yes: NPRS scale: 7/10 Pain location: R knee Pain description: stiffness Aggravating factors: being cold  Relieving factors: heat, pain relieving lotion   PRECAUTIONS: Fall  WEIGHT BEARING RESTRICTIONS: No  FALLS: Has patient fallen in last 6 months? Yes. Number of falls 2- fall that brought her to the ED, slipped off EOB to the floor on another occasion, no FOF   LIVING ENVIRONMENT: Lives with: lives with their family Lives in: House/apartment Stairs: ramped entrance, no steps inside home  Has following equipment at home: shower chair, Grab bars, and Ramped entry  PLOF: Independent, Independent with basic ADLs, Independent with gait, and Independent with transfers  PATIENT GOALS: be able to walk again "not a long ways but be able to walk about a block"   OBJECTIVE:   DIAGNOSTIC FINDINGS: Right hand wrist elbow and shoulder pain swelling x 1 wk [facility] -[facility] done, worried abt steroid use due to her Dm per family, xray and blood worse were done there- was told it is from Rheumatoid arthritis-she will need to follow-up with rheumatology outpatient Added scheduled Tylenol 1000 mg, ibuprofen as needed  PT OT evaluations - need rehab needs.  IMPRESSION: 1.  No evidence of an acute intracranial abnormality. 2. Mild chronic small vessel ischemic changes within the cerebral white matter. 3. Mild generalized parenchymal atrophy. 4. Bilateral proptosis.   Left ankle pain and numbness- now mostly pain Hx of gout: on allupurinol.  COGNITION: Overall cognitive status: Within functional limits for tasks assessed   TODAY'S TREATMENT:  Vitals:   09/10/22 1109  BP: (!) 147/60  Pulse: 64    Dynamic Warmup Fwd gait with horizontal head turns 2 x 20' (CGA) Fwd gait with vertical head turns 2 x 20'  (CGA) Bckwd gait 2 x 20' (emphasis on larger steps) (CGA) Tandem gait modified as needed 2 x 20' (CGA-minA)  Corner Balance: (SBA unless otherwise specified) EO NBOS firm x 30" EC NBOS firm x 30" EC NBOS firm with horizontal head turns x 30" EO NBOS foam x 30" EC NBOS foam x 30" EC NBOS foam with horizontal head turns x 2-4" (minA) EC WBOS foam with horizontal head turns 2 x 20" (minA) Tandem/semi-tandem balance with EO 1 x 30"  Rocker board Static holds 3 x 30" Rocker board EC static holds x 5 (unable to maintain balance for more than a few seconds, posterior LOB) Rocker board fwd/bckwd chain of support x 10  PATIENT EDUCATION: Education details:  Updates HEP + Continue HEP Person educated: Patient Education method: Explanation Education comprehension: verbalized understanding, returned demonstration, and needs further education  HOME EXERCISE PROGRAM: You Can Walk For A Certain Length Of Time Each Day                          Walk 5 minutes 1-2 times per day.             Increase 1-2  minutes every 7 days              Work up to 10 minutes (1-2 times per day).               Example:                         Day 1-2           4-5 minutes     3 times per day                         Day 7-8           10-12 minutes 2-3 times per day                         Day 13-14       20-22 minutes 1-2 times per day  Access Code: [email or web address] URL: [email or web address] Date: 09/10/2022 Prepared by: Maryruth Eve  Exercises - Sit to Stand with Arms Crossed  - 1 x daily - 4-5 x weekly - 2 sets - 10 reps - Side Stepping with Resistance at Thighs and Counter Support  - 1 x daily - 4 x weekly - 3-4 sets - 10 reps - Seated March with Resistance  - 1 x daily - 5 x weekly - 2 sets - 20 reps - Seated Hamstring Curls with Resistance  - 1 x daily - 5 x weekly - 2 sets - 10 reps - Corner Balance Feet Together: Eyes Closed With Head Turns  - 1 x daily - 7 x weekly - 3 sets - 10 reps -  Tandem Stance  - 1 x daily - 7 x weekly - 3 sets - 10 reps  GOALS: Goals reviewed with patient? Yes  SHORT TERM GOALS: Target date: 09/30/2022    Will be compliant with appropriate progressive HEP with MinA from family  Baseline: Goal status: INITIAL  2.  Subjective complaints of whole body stiffness  to have improved by 50%  Baseline:  Goal status: INITIAL  3.  Pt and family to be able to verbalize 3 ways to reduce fall risk at home and in the community  Baseline:  Goal status: INITIAL  4.  Will be compliant with daily 10 minute walking program with family in order to improve general functional activity tolerance and complaints of fatigue  Baseline:  Goal status: INITIAL    LONG TERM GOALS: Target date: 10/28/2022    MMT to improve by at least 1 grade in all weak groups  Baseline:  Goal status: INITIAL  2.  Will score at least 43 on Berg balance test in order to show reduced fall risk Baseline:  Goal status: INITIAL  3.  Will be able to ambulate at least 421ft in with LRAD in order to improve community access  Baseline: 240' no AD (6/11) Goal status: REVISED-6/11 as no baseline present from evaluation.  4.  Will be compliant with appropriate advanced HEP with MinA from family in addition to regular advanced walking program (20-30 minutes at least 3x/week) in order to maintain functional gains from PT  Baseline:  Goal status: INITIAL    ASSESSMENT:  CLINICAL IMPRESSION: Session emphasized work on both static and dynamic balance. Patient demonstrates scissoring corrective steps and lateral LOB with self correction with CGA with gait with horizontal head turns. Patient also demonstrates repeated posterior LOB with balance activities. Introduced HEP exercises to help manage tasks. Will pivot to static and dynamic balance interventions at next session.  OBJECTIVE IMPAIRMENTS: Abnormal gait, decreased activity tolerance, decreased balance, decreased cognition,  decreased coordination, decreased mobility, difficulty walking, decreased ROM, decreased strength, hypomobility, impaired perceived functional ability, and pain.   ACTIVITY LIMITATIONS: standing, squatting, transfers, bed mobility, and locomotion level  PARTICIPATION LIMITATIONS: cleaning, laundry, shopping, community activity, and church  PERSONAL FACTORS: Age, Behavior pattern, Education, Fitness, Past/current experiences, Social background, and Time since onset of injury/illness/exacerbation are also affecting patient's functional outcome.   REHAB POTENTIAL: Good  CLINICAL DECISION MAKING: Stable/uncomplicated  EVALUATION COMPLEXITY: Low  PLAN:  PT FREQUENCY: 2x/week  PT DURATION: 8 weeks  PLANNED INTERVENTIONS: Therapeutic exercises, Therapeutic activity, Neuromuscular re-education, Gait training, Self Care,  and Re-evaluation  PLAN FOR NEXT SESSION: monitor reports of vertigo and re-examine PRN. Otherwise focus on functional strength and balance, functional activity tolerance. Reactive stepping, modified SLS activities, dynamic/static balance  Maryruth Eve, PT, DPT

## 2022-09-15 ENCOUNTER — Ambulatory Visit: Payer: Medicare HMO | Admitting: Physical Therapy

## 2022-09-15 DIAGNOSIS — H353111 Nonexudative age-related macular degeneration, right eye, early dry stage: Secondary | ICD-10-CM | POA: Diagnosis not present

## 2022-09-15 DIAGNOSIS — H401131 Primary open-angle glaucoma, bilateral, mild stage: Secondary | ICD-10-CM | POA: Diagnosis not present

## 2022-09-17 ENCOUNTER — Other Ambulatory Visit: Payer: Self-pay | Admitting: Internal Medicine

## 2022-09-17 ENCOUNTER — Encounter: Payer: Self-pay | Admitting: Physical Therapy

## 2022-09-17 ENCOUNTER — Other Ambulatory Visit: Payer: Self-pay | Admitting: Neurology

## 2022-09-17 ENCOUNTER — Ambulatory Visit: Payer: Medicare HMO | Admitting: Physical Therapy

## 2022-09-17 ENCOUNTER — Telehealth: Payer: Self-pay | Admitting: Medical

## 2022-09-17 ENCOUNTER — Other Ambulatory Visit: Payer: Self-pay | Admitting: Adult Health

## 2022-09-17 VITALS — BP 127/51 | HR 78

## 2022-09-17 DIAGNOSIS — R2681 Unsteadiness on feet: Secondary | ICD-10-CM

## 2022-09-17 DIAGNOSIS — R29898 Other symptoms and signs involving the musculoskeletal system: Secondary | ICD-10-CM

## 2022-09-17 DIAGNOSIS — M6281 Muscle weakness (generalized): Secondary | ICD-10-CM

## 2022-09-17 DIAGNOSIS — R42 Dizziness and giddiness: Secondary | ICD-10-CM

## 2022-09-17 DIAGNOSIS — R55 Syncope and collapse: Secondary | ICD-10-CM | POA: Diagnosis not present

## 2022-09-17 NOTE — Telephone Encounter (Signed)
Pt called and states when she went to PT today her BP was real low and her PT told her too call her doctor to see if it has anything to do with her medications. I did ask her if she remembers the reading but she says she does not.

## 2022-09-17 NOTE — Telephone Encounter (Signed)
Called pt and advised of Dr. Delford Field advice.

## 2022-09-17 NOTE — Therapy (Signed)
OUTPATIENT PHYSICAL THERAPY NEURO TREATMENT   Patient Name: Shelly Blevins MRN: [id number] DOB:1945/11/07, 77 y.o., female Today's Date: 09/17/2022   PCP: Benard Rink  REFERRING PROVIDER: Lanae Boast, MD  END OF SESSION:  PT End of Session - 09/17/22 0852     Visit Number 4    Number of Visits 17    Date for PT Re-Evaluation 10/28/22    Authorization Type [employer] MCR    Authorization Time Period 09/02/22 to 10/28/22    Progress Note Due on Visit 10    PT Start Time 0850    PT Stop Time 0935    PT Time Calculation (min) 45 min    Equipment Utilized During Treatment Gait belt    Activity Tolerance Patient tolerated treatment well    Behavior During Therapy WFL for tasks assessed/performed             Past Medical History:  Diagnosis Date   Allergy    Arthritis    CAD (coronary artery disease)    mild nonobstructive disease, 30% Cfx lesion   Cataract    Diabetes mellitus    insulin-dependent; Dr. Talmage Nap   Dyslipidemia    GERD (gastroesophageal reflux disease)    Hyperlipidemia    Hypertension    Hypothyroidism    Dr. Talmage Nap   Stroke [facility])    Past Surgical History:  Procedure Laterality Date   ABDOMINAL HYSTERECTOMY     CARDIAC CATHETERIZATION  02/17/2007   mild CAD with 50% prox L Cfx stenosis, normal LV function (Dr. Claudia Desanctis)   CHOLECYSTECTOMY     EYE SURGERY     NM MYOCAR PERF WALL MOTION  2009   lexiscan myoview - perfusion defect in anterior myociaral region (breast attenuation) - low risk scan - EF 69%   THYROIDECTOMY     TRANSTHORACIC ECHOCARDIOGRAM  2008   normal study    Patient Active Problem List   Diagnosis Date Noted   Medication management 09/03/2022   Hospital discharge follow-up 09/03/2022   Hypercholesterolemia 09/03/2022   History of gout 09/03/2022   Fall 08/19/2022   Hypoglycemia associated with diabetes (HCC) 08/19/2022   Near syncope 08/19/2022   Elevated uric acid in blood 05/12/2021   Chronic gout without tophus  05/12/2021   High risk medication use 05/12/2021   Anemia 01/23/2021   Diabetic neuropathy (HCC) 01/15/2021   Long term (current) use of insulin (HCC) 01/15/2021   Diabetes mellitus (HCC) 12/31/2020   Hypertrophy of nail 12/31/2020   Pain in joints of both feet 12/31/2020   Abnormal ankle brachial index (ABI) 12/31/2020   Encounter for health maintenance examination in adult 04/25/2020   Medicare annual wellness visit, subsequent 04/25/2020   Memory change 04/25/2020   Post-menopausal 04/25/2020   Constipation 02/29/2020   Osteoarthritis of both knees 01/10/2020   Chronic pain of both knees 03/28/2019   Vitamin D deficiency 03/28/2019   Ataxia 11/02/2016   Estrogen deficiency 11/02/2016   Vaccine counseling 10/09/2015   Advanced directives, counseling/discussion 10/09/2015   Need for prophylactic vaccination against Streptococcus pneumoniae (pneumococcus) 10/09/2015   Hypothyroidism 04/17/2015   Essential hypertension 06/06/2013   Dyslipidemia 06/06/2013   Coronary artery disease due to lipid rich plaque 06/06/2013   GERD 10/29/2008    ONSET DATE: 08/19/22  REFERRING DIAG: R55 (ICD-10-CM) - Near syncope  THERAPY DIAG:  Muscle weakness (generalized)  Other symptoms and signs involving the musculoskeletal system  Unsteadiness on feet  Dizziness and giddiness  Rationale for Evaluation and Treatment: Rehabilitation  SUBJECTIVE:                                                                                                                                                                                             SUBJECTIVE STATEMENT: Pt reports that she woke up dizzy this morning. She has felt off and unsteady since then. Patient reports that she woke up also feeling really tired. Patient reports that she feels a little lightheaded. Patient denies any falls since last present for therapy. Patient denies any falls/near falls. Patient states she would like to continue with  therapy today.   Pt accompanied by:  grand-daughter   PERTINENT HISTORY: OA, CAD, DM, HLD, HTN, hypothyroidism, CVA, hx cardiac cath, hx cholecystectomy  PAIN:  Are you having pain? No  PRECAUTIONS: Fall  WEIGHT BEARING RESTRICTIONS: No  FALLS: Has patient fallen in last 6 months? Yes. Number of falls 2- fall that brought her to the ED, slipped off EOB to the floor on another occasion, no FOF   LIVING ENVIRONMENT: Lives with: lives with their family Lives in: House/apartment Stairs: ramped entrance, no steps inside home  Has following equipment at home: shower chair, Grab bars, and Ramped entry  PLOF: Independent, Independent with basic ADLs, Independent with gait, and Independent with transfers  PATIENT GOALS: be able to walk again "not a long ways but be able to walk about a block"   OBJECTIVE:   DIAGNOSTIC FINDINGS: Right hand wrist elbow and shoulder pain swelling x 1 wk [facility] -[facility], worried abt steroid use due to her Dm per family, xray and blood worse were done there- was told it is from Rheumatoid arthritis-she will need to follow-up with rheumatology outpatient Added scheduled Tylenol 1000 mg, ibuprofen as needed  PT OT evaluations - need rehab needs.  IMPRESSION: 1.  No evidence of an acute intracranial abnormality. 2. Mild chronic small vessel ischemic changes within the cerebral white matter. 3. Mild generalized parenchymal atrophy. 4. Bilateral proptosis.   Left ankle pain and numbness- now mostly pain Hx of gout: on allupurinol.  COGNITION: Overall cognitive status: Within functional limits for tasks assessed   TODAY'S TREATMENT:  Vitals:   09/17/22 0856 09/17/22 0915  BP: (!) 137/56 (!) 127/51  Pulse: 73 78  Vitals overall stable - diastolic slightly lower, will attempt SciFit and reassess; patient reports  glucose has been in good ranges  TherEx: Initiated session with SciFit lvl 4 - 9 minutes on intermittent hill setting to increase BP, improve alertness for session; reports improvement in fatigue with activity  NMR Between // bars Step ups onto 4" step - initally with 3lb weights but held due to L shoulder weakness (CGA) 3 x 10 Step taps standing on firm onto 4" step 3 x 10 (CGA) Step taps standing on foam onto 4" step 2 x 10 (CGA-minA) 30 second sit to stand - completes 10 reps (reports stinging in L knee afer completing) Modified SLS with yellow ball roll and no UE support (CGA-minA) 4 x 10-20" bilaterally  PATIENT EDUCATION: Education details: Continue POC + HEP Person educated: Patient Education method: Explanation Education comprehension: verbalized understanding, returned demonstration, and needs further education  HOME EXERCISE PROGRAM: You Can Walk For A Certain Length Of Time Each Day                          Walk 5 minutes 1-2 times per day.             Increase 1-2  minutes every 7 days              Work up to 10 minutes (1-2 times per day).               Example:                         Day 1-2           4-5 minutes     3 times per day                         Day 7-8           10-12 minutes 2-3 times per day                         Day 13-14       20-22 minutes 1-2 times per day  Access Code: [email or web address] URL: [email or web address] Date: 09/10/2022 Prepared by: Maryruth Eve  Exercises - Sit to Stand with Arms Crossed  - 1 x daily - 4-5 x weekly - 2 sets - 10 reps - Side Stepping with Resistance at Thighs and Counter Support  - 1 x daily - 4 x weekly - 3-4 sets - 10 reps - Seated March with Resistance  - 1 x daily - 5 x weekly - 2 sets - 20 reps - Seated Hamstring Curls with Resistance  - 1 x daily - 5 x weekly - 2 sets - 10 reps - Corner Balance Feet Together: Eyes Closed With Head Turns  - 1 x daily - 7 x weekly - 3 sets - 10 reps - Tandem Stance  - 1  x daily - 7 x weekly - 3 sets - 10 reps  GOALS: Goals reviewed with patient? Yes  SHORT TERM GOALS: Target date: 09/30/2022    Will be compliant with appropriate progressive HEP with MinA from family  Baseline: Goal status: INITIAL  2.  Subjective complaints of whole body stiffness to have improved by 50%  Baseline:  Goal status:  INITIAL  3.  Pt and family to be able to verbalize 3 ways to reduce fall risk at home and in the community  Baseline:  Goal status: INITIAL  4.  Will be compliant with daily 10 minute walking program with family in order to improve general functional activity tolerance and complaints of fatigue  Baseline:  Goal status: INITIAL    LONG TERM GOALS: Target date: 10/28/2022    MMT to improve by at least 1 grade in all weak groups  Baseline:  Goal status: INITIAL  2.  Will score at least 43 on Berg balance test in order to show reduced fall risk Baseline:  Goal status: INITIAL  3.  Will be able to ambulate at least 462ft in with LRAD in order to improve community access  Baseline: 240' no AD (6/11) Goal status: REVISED-6/11 as no baseline present from evaluation.  4.  Will be compliant with appropriate advanced HEP with MinA from family in addition to regular advanced walking program (20-30 minutes at least 3x/week) in order to maintain functional gains from PT  Baseline:  Goal status: INITIAL    ASSESSMENT:  CLINICAL IMPRESSION: Session emphasized continued progression of modified SLS tasks. Given subjective report of patient's BP at start of session initiated session on SciFit which helped improved patient's overall energy levels throughout session. Patient required mix of CGA-minA for stability tasks. Limited slightly in session by complaints of L knee and ankle pain in addition to lower energy levels. Patient will benefit from continued skilled physical therapy services to address impairments in order to maximize patient's function.    OBJECTIVE IMPAIRMENTS: Abnormal gait, decreased activity tolerance, decreased balance, decreased cognition, decreased coordination, decreased mobility, difficulty walking, decreased ROM, decreased strength, hypomobility, impaired perceived functional ability, and pain.   ACTIVITY LIMITATIONS: standing, squatting, transfers, bed mobility, and locomotion level  PARTICIPATION LIMITATIONS: cleaning, laundry, shopping, community activity, and church  PERSONAL FACTORS: Age, Behavior pattern, Education, Fitness, Past/current experiences, Social background, and Time since onset of injury/illness/exacerbation are also affecting patient's functional outcome.   REHAB POTENTIAL: Good  CLINICAL DECISION MAKING: Stable/uncomplicated  EVALUATION COMPLEXITY: Low  PLAN:  PT FREQUENCY: 2x/week  PT DURATION: 8 weeks  PLANNED INTERVENTIONS: Therapeutic exercises, Therapeutic activity, Neuromuscular re-education, Gait training, Self Care,  and Re-evaluation  PLAN FOR NEXT SESSION: monitor reports of vertigo and re-examine PRN. Otherwise focus on functional strength and balance, functional activity tolerance. Reactive stepping, modified SLS activities, dynamic/static balance, check in on patient's energy levels  Maryruth Eve, PT, DPT

## 2022-09-17 NOTE — Telephone Encounter (Signed)
Her BP is not significantly different than when she was seen at last visit.  I believe she has DM, and blood sugars could also be an issue. She should stay well hydrated, be sure to check her sugars, and f/u with Shelly Blevins next week if not feeling better

## 2022-09-18 ENCOUNTER — Other Ambulatory Visit: Payer: Self-pay | Admitting: Medical

## 2022-09-22 ENCOUNTER — Ambulatory Visit: Payer: Medicare HMO | Admitting: Physical Therapy

## 2022-09-22 ENCOUNTER — Encounter: Payer: Self-pay | Admitting: Physical Therapy

## 2022-09-22 VITALS — BP 131/53 | HR 66

## 2022-09-22 DIAGNOSIS — R55 Syncope and collapse: Secondary | ICD-10-CM | POA: Diagnosis not present

## 2022-09-22 DIAGNOSIS — R29898 Other symptoms and signs involving the musculoskeletal system: Secondary | ICD-10-CM

## 2022-09-22 DIAGNOSIS — R2681 Unsteadiness on feet: Secondary | ICD-10-CM | POA: Diagnosis not present

## 2022-09-22 DIAGNOSIS — M6281 Muscle weakness (generalized): Secondary | ICD-10-CM

## 2022-09-22 DIAGNOSIS — R42 Dizziness and giddiness: Secondary | ICD-10-CM | POA: Diagnosis not present

## 2022-09-22 NOTE — Therapy (Signed)
OUTPATIENT PHYSICAL THERAPY NEURO TREATMENT   Patient Name: Shelly Blevins MRN: [id number] DOB:21-Jun-1945, 77 y.o., female Today's Date: 09/22/2022   PCP: Benard Rink  REFERRING PROVIDER: Lanae Boast, MD  END OF SESSION:  PT End of Session - 09/22/22 0930     Visit Number 5    Number of Visits 17    Date for PT Re-Evaluation 10/28/22    Authorization Type [employer] MCR    Authorization Time Period 09/02/22 to 10/28/22    Progress Note Due on Visit 10    PT Start Time 0930    PT Stop Time 1010    PT Time Calculation (min) 40 min    Equipment Utilized During Treatment Gait belt    Activity Tolerance Patient tolerated treatment well;Patient limited by fatigue    Behavior During Therapy WFL for tasks assessed/performed             Past Medical History:  Diagnosis Date   Allergy    Arthritis    CAD (coronary artery disease)    mild nonobstructive disease, 30% Cfx lesion   Cataract    Diabetes mellitus    insulin-dependent; Dr. Talmage Nap   Dyslipidemia    GERD (gastroesophageal reflux disease)    Hyperlipidemia    Hypertension    Hypothyroidism    Dr. Talmage Nap   Stroke [facility])    Past Surgical History:  Procedure Laterality Date   ABDOMINAL HYSTERECTOMY     CARDIAC CATHETERIZATION  02/17/2007   mild CAD with 50% prox L Cfx stenosis, normal LV function (Dr. Claudia Desanctis)   CHOLECYSTECTOMY     EYE SURGERY     NM MYOCAR PERF WALL MOTION  2009   lexiscan myoview - perfusion defect in anterior myociaral region (breast attenuation) - low risk scan - EF 69%   THYROIDECTOMY     TRANSTHORACIC ECHOCARDIOGRAM  2008   normal study    Patient Active Problem List   Diagnosis Date Noted   Medication management 09/03/2022   Hospital discharge follow-up 09/03/2022   Hypercholesterolemia 09/03/2022   History of gout 09/03/2022   Fall 08/19/2022   Hypoglycemia associated with diabetes (HCC) 08/19/2022   Near syncope 08/19/2022   Elevated uric acid in blood 05/12/2021    Chronic gout without tophus 05/12/2021   High risk medication use 05/12/2021   Anemia 01/23/2021   Diabetic neuropathy (HCC) 01/15/2021   Long term (current) use of insulin (HCC) 01/15/2021   Diabetes mellitus (HCC) 12/31/2020   Hypertrophy of nail 12/31/2020   Pain in joints of both feet 12/31/2020   Abnormal ankle brachial index (ABI) 12/31/2020   Encounter for health maintenance examination in adult 04/25/2020   Medicare annual wellness visit, subsequent 04/25/2020   Memory change 04/25/2020   Post-menopausal 04/25/2020   Constipation 02/29/2020   Osteoarthritis of both knees 01/10/2020   Chronic pain of both knees 03/28/2019   Vitamin D deficiency 03/28/2019   Ataxia 11/02/2016   Estrogen deficiency 11/02/2016   Vaccine counseling 10/09/2015   Advanced directives, counseling/discussion 10/09/2015   Need for prophylactic vaccination against Streptococcus pneumoniae (pneumococcus) 10/09/2015   Hypothyroidism 04/17/2015   Essential hypertension 06/06/2013   Dyslipidemia 06/06/2013   Coronary artery disease due to lipid rich plaque 06/06/2013   GERD 10/29/2008    ONSET DATE: 08/19/22  REFERRING DIAG: R55 (ICD-10-CM) - Near syncope  THERAPY DIAG:  Muscle weakness (generalized)  Other symptoms and signs involving the musculoskeletal system  Unsteadiness on feet  Dizziness and giddiness  Rationale for Evaluation  and Treatment: Rehabilitation  SUBJECTIVE:                                                                                                                                                                                             SUBJECTIVE STATEMENT: Patient reports her energy level is about a 5/10 at the onset of today's session.  She reports sleeping well and not having a recently busy schedule.  Patient denies current or recent dizziness or lightheadedness.  Patient denies any falls/near falls.   Pt accompanied by:  grand-daughter not present  PERTINENT  HISTORY: OA, CAD, DM, HLD, HTN, hypothyroidism, CVA, hx cardiac cath, hx cholecystectomy  PAIN:  Are you having pain? Yes: NPRS scale: 3/10 Pain location: right heel Pain description: throbbing Aggravating factors: random Relieving factors: nothing  PRECAUTIONS: Fall  WEIGHT BEARING RESTRICTIONS: No  FALLS: Has patient fallen in last 6 months? Yes. Number of falls 2- fall that brought her to the ED, slipped off EOB to the floor on another occasion, no FOF   LIVING ENVIRONMENT: Lives with: lives with their family Lives in: House/apartment Stairs: ramped entrance, no steps inside home  Has following equipment at home: shower chair, Grab bars, and Ramped entry  PLOF: Independent, Independent with basic ADLs, Independent with gait, and Independent with transfers  PATIENT GOALS: be able to walk again "not a long ways but be able to walk about a block"   OBJECTIVE:   DIAGNOSTIC FINDINGS: Right hand wrist elbow and shoulder pain swelling x 1 wk [facility] at [facility] -xray done, worried abt steroid use due to her Dm per family, xray and blood worse were done there- was told it is from Rheumatoid arthritis-she will need to follow-up with rheumatology outpatient Added scheduled Tylenol 1000 mg, ibuprofen as needed  PT OT evaluations - need rehab needs.  IMPRESSION: 1.  No evidence of an acute intracranial abnormality. 2. Mild chronic small vessel ischemic changes within the cerebral white matter. 3. Mild generalized parenchymal atrophy. 4. Bilateral proptosis.   Left ankle pain and numbness- now mostly pain Hx of gout: on allupurinol.  COGNITION: Overall cognitive status: Within functional limits for tasks assessed   TODAY'S TREATMENT:  Vitals (LUE in sitting prior to session): Vitals:   09/22/22 0940  BP: (!) 131/53  Pulse: 66   -Resisted forward walking  w/ green loop band x115' CGA -Forward walking w/ multidirectional perturbations w/ green loop band x230' CGA, minimal stepping and diminished step size at baseline -Backwards ambulation w/ consistent resistance x35' w/ pt reporting feeling weakness in the knees so returned to sitting for prolonged seated rest -Standing at bottom of stairs w/ SLS and contralateral step taps to 3rd step using BUE support and CGA x12 each LE, pt reporting lightheadedness following task so BP reassessed in sitting on LUE:  142/55, HR 61bpm -Standing on doubled blue mat standing marches x20 > added short lever arm holding 3.3 lb ball at chest level 2x20, CGA for all due to migration forward on mat -Standing on doubled blue mat performing midline alternating cone taps w/ repeated left LOB in SLS w/ CGA-minA to correct Pt reports energy level of 3/10 at end of session.  PATIENT EDUCATION: Education details: Discussed BP stability noted today and continued use of HEP to maintain strength and tolerance. Person educated: Patient Education method: Explanation Education comprehension: verbalized understanding, returned demonstration, and needs further education  HOME EXERCISE PROGRAM: You Can Walk For A Certain Length Of Time Each Day                          Walk 5 minutes 1-2 times per day.             Increase 1-2  minutes every 7 days              Work up to 10 minutes (1-2 times per day).               Example:                         Day 1-2           4-5 minutes     3 times per day                         Day 7-8           10-12 minutes 2-3 times per day                         Day 13-14       20-22 minutes 1-2 times per day  Access Code: [email or web address] URL: [email or web address] Date: 09/10/2022 Prepared by: Maryruth Eve  Exercises - Sit to Stand with Arms Crossed  - 1 x daily - 4-5 x weekly - 2 sets - 10 reps - Side Stepping with Resistance at Thighs and Counter Support  - 1 x daily - 4 x weekly  - 3-4 sets - 10 reps - Seated March with Resistance  - 1 x daily - 5 x weekly - 2 sets - 20 reps - Seated Hamstring Curls with Resistance  - 1 x daily - 5 x weekly - 2 sets - 10 reps - Corner Balance Feet Together: Eyes Closed With Head Turns  - 1 x daily - 7 x weekly - 3 sets - 10 reps - Tandem Stance  - 1 x daily - 7 x weekly - 3 sets - 10 reps  GOALS: Goals reviewed with patient? Yes  SHORT TERM GOALS: Target date: 09/30/2022    Will be  compliant with appropriate progressive HEP with MinA from family  Baseline: Goal status: INITIAL  2.  Subjective complaints of whole body stiffness to have improved by 50%  Baseline:  Goal status: INITIAL  3.  Pt and family to be able to verbalize 3 ways to reduce fall risk at home and in the community  Baseline:  Goal status: INITIAL  4.  Will be compliant with daily 10 minute walking program with family in order to improve general functional activity tolerance and complaints of fatigue  Baseline:  Goal status: INITIAL  LONG TERM GOALS: Target date: 10/28/2022   MMT to improve by at least 1 grade in all weak groups  Baseline:  Goal status: INITIAL  2.  Will score at least 43 on Berg balance test in order to show reduced fall risk Baseline:  Goal status: INITIAL  3.  Will be able to ambulate at least 459ft in with LRAD in order to improve community access  Baseline: 240' no AD (6/11) Goal status: REVISED-6/11 as no baseline present from evaluation.  4.  Will be compliant with appropriate advanced HEP with MinA from family in addition to regular advanced walking program (20-30 minutes at least 3x/week) in order to maintain functional gains from PT  Baseline:  Goal status: INITIAL    ASSESSMENT:  CLINICAL IMPRESSION: Patient remains limited by low energy and decreased tolerance to prolonged activity this session.  She does demonstrate stable BP throughout session despite increased fatigue.  Focused on variable SLS and ankle  strategy to improve static balance.  She continues to benefit from skilled PT to improve functional mobility, efficiency with movement and safety related to fatigue and BP stability.   OBJECTIVE IMPAIRMENTS: Abnormal gait, decreased activity tolerance, decreased balance, decreased cognition, decreased coordination, decreased mobility, difficulty walking, decreased ROM, decreased strength, hypomobility, impaired perceived functional ability, and pain.   ACTIVITY LIMITATIONS: standing, squatting, transfers, bed mobility, and locomotion level  PARTICIPATION LIMITATIONS: cleaning, laundry, shopping, community activity, and church  PERSONAL FACTORS: Age, Behavior pattern, Education, Fitness, Past/current experiences, Social background, and Time since onset of injury/illness/exacerbation are also affecting patient's functional outcome.   REHAB POTENTIAL: Good  CLINICAL DECISION MAKING: Stable/uncomplicated  EVALUATION COMPLEXITY: Low  PLAN:  PT FREQUENCY: 2x/week  PT DURATION: 8 weeks  PLANNED INTERVENTIONS: Therapeutic exercises, Therapeutic activity, Neuromuscular re-education, Gait training, Self Care,  and Re-evaluation  PLAN FOR NEXT SESSION: monitor reports of vertigo and re-examine PRN. Otherwise focus on functional strength and balance, functional activity tolerance. Reactive stepping-try retro perturbations again, modified SLS activities, dynamic/static balance, check in on patient's energy levels, forward bending seated > standing tasks, core engagement for BP support if contributing to dizziness/lightheadedness  Camille Bal, PT, DPT

## 2022-09-23 ENCOUNTER — Encounter: Payer: Self-pay | Admitting: Medical

## 2022-09-23 ENCOUNTER — Ambulatory Visit (INDEPENDENT_AMBULATORY_CARE_PROVIDER_SITE_OTHER): Payer: Medicare HMO | Admitting: Medical

## 2022-09-23 VITALS — BP 142/70 | HR 68 | Wt 176.4 lb

## 2022-09-23 DIAGNOSIS — Z79899 Other long term (current) drug therapy: Secondary | ICD-10-CM | POA: Diagnosis not present

## 2022-09-23 DIAGNOSIS — E039 Hypothyroidism, unspecified: Secondary | ICD-10-CM | POA: Diagnosis not present

## 2022-09-23 DIAGNOSIS — E559 Vitamin D deficiency, unspecified: Secondary | ICD-10-CM | POA: Diagnosis not present

## 2022-09-23 DIAGNOSIS — E11649 Type 2 diabetes mellitus with hypoglycemia without coma: Secondary | ICD-10-CM

## 2022-09-23 DIAGNOSIS — I2510 Atherosclerotic heart disease of native coronary artery without angina pectoris: Secondary | ICD-10-CM

## 2022-09-23 DIAGNOSIS — E785 Hyperlipidemia, unspecified: Secondary | ICD-10-CM | POA: Diagnosis not present

## 2022-09-23 DIAGNOSIS — Z794 Long term (current) use of insulin: Secondary | ICD-10-CM

## 2022-09-23 DIAGNOSIS — E790 Hyperuricemia without signs of inflammatory arthritis and tophaceous disease: Secondary | ICD-10-CM | POA: Diagnosis not present

## 2022-09-23 DIAGNOSIS — I10 Essential (primary) hypertension: Secondary | ICD-10-CM | POA: Diagnosis not present

## 2022-09-23 DIAGNOSIS — I2583 Coronary atherosclerosis due to lipid rich plaque: Secondary | ICD-10-CM

## 2022-09-23 DIAGNOSIS — E1169 Type 2 diabetes mellitus with other specified complication: Secondary | ICD-10-CM | POA: Diagnosis not present

## 2022-09-23 DIAGNOSIS — E1142 Type 2 diabetes mellitus with diabetic polyneuropathy: Secondary | ICD-10-CM

## 2022-09-23 DIAGNOSIS — M1A9XX0 Chronic gout, unspecified, without tophus (tophi): Secondary | ICD-10-CM

## 2022-09-23 MED ORDER — AMLODIPINE BESYLATE-VALSARTAN 10-160 MG PO TABS: 1.0000 | ORAL_TABLET | Freq: Every day | ORAL | 2 refills | Status: DC

## 2022-09-23 MED ORDER — DULOXETINE HCL 60 MG PO CPEP: 60.0000 mg | ORAL_CAPSULE | Freq: Every day | ORAL | 2 refills | Status: DC

## 2022-09-23 NOTE — Progress Notes (Signed)
Subjective:  Shelly Blevins is a 77 y.o. female who presents for Chief Complaint  Patient presents with   other    A little dizzy today at therapy it has been a little high on the top number,      Here for recheck.  At her recent visit her daughterJoashana lives with her.  Shelly Blevins is here by herself today.   Primary Care Provider Shelly Blevins, Shelly Balo, PA-C here for primary care  Current Health Care Team: Dentist, none, has full dentures Eye doctor, Dr. Dr. Elmer Picker, Dr. Alben Spittle Dr. Dorisann Frames, Dennie Maizes PA, endocrinology Dr. Zoila Shutter, cardiology Dr. Yancey Flemings, GI Dr. Haydee Salter, rheumatology Doreatha Massed, [place] and Dr. Coral Else, vascular surgery [facility] Dr. Naomie Dean and Butch Penny, NP, neurology   I saw her recently for a variety of concerns including medication issues, concerned about being on too many medications, and post hospital follow-up from hypoglycemia.  She requested a referral to a different endocrinologist last visit but has not heard back yet for my referral.  Her medical history includes hypertension, dyslipidemia, coronary artery disease, hypothyroidism, GERD, depression, constipation, diabetes, diabetic neuropathy, anemia, chronic gout, memory loss  She also has not heard back yet I referral to pharmacist consult  She has been in her usual state of health since last visit except for today has felt a little dizziness about 30 minutes ago but no numbness, tingling, confusion, slurred speech.  She does have some mild headache.  No recent fall.  Of note, she was hospitalized 08/19/2022 through 08/21/2022 for hypoglycemia  She feels burdened with too many pills.  Some of her medicines are very expensive.  [name] is over $150 per month, insulin was over 100 bucks per month but she found that she could send the insulin to [facility] for $45 per month.  Prior to her recent hospitalization she was on 60 units of insulin twice daily.   But given the hypoglycemia and they discharged her on 5 units twice daily of the 70/30 mix insulin.  She was started on the freestyle libre glucose continuous monitor and recent blood sugars have been ranging 80-110 per daughter.  Last visit we set her up for a reader for her freestyle libre device which she is using.  She has a reader with her today.  Her 14-day glucose average is around 126.  There have been only 1 hypoglycemic episode since leaving the hospital back in May.  She notes compliance with her other medicines.  Currently living with her son for the past year  No other aggravating or relieving factors.    No other c/o.  Past Medical History:  Diagnosis Date   Allergy    Arthritis    CAD (coronary artery disease)    mild nonobstructive disease, 30% Cfx lesion   Cataract    Diabetes mellitus    insulin-dependent; Dr. Talmage Nap   Dyslipidemia    GERD (gastroesophageal reflux disease)    Hyperlipidemia    Hypertension    Hypothyroidism    Dr. Talmage Nap   Stroke [facility])    Current Outpatient Medications on File Prior to Visit  Medication Sig Dispense Refill   acetaminophen (TYLENOL) 500 MG tablet Take 1,000 mg by mouth every 6 (six) hours as needed for mild pain or moderate pain.     albuterol (VENTOLIN HFA) 108 (90 Base) MCG/ACT inhaler Inhale 2 puffs into the lungs every 6 (six) hours as needed for wheezing or shortness of breath.  Alcohol Swabs (GLOBAL ALCOHOL PREP EASE) 70 % PADS USE AS DIRECTED TO test to check blood sugar AND FOR insulin USE 200 each 1   allopurinol (ZYLOPRIM) 100 MG tablet Take 1 tablet (100 mg total) by mouth daily. 90 tablet 1   aspirin EC 81 MG tablet Take 1 tablet (81 mg total) by mouth daily. 90 tablet 3   atorvastatin (LIPITOR) 80 MG tablet Take 1 tablet (80 mg total) by mouth every evening. 90 tablet 1   BD PEN NEEDLE NANO 2ND GEN 32G X 4 MM MISC 2 (two) times daily. as directed     Continuous Glucose Receiver (FREESTYLE LIBRE 3 READER) DEVI 1  Device by Does not apply route daily. 1 each 0   Continuous Glucose Sensor (FREESTYLE LIBRE 3 SENSOR) MISC Place 1 sensor on the skin every 14 days. Use to check glucose continuously 2 each 1   donepezil (ARICEPT) 10 MG tablet TAKE 1 TABLET (10 MG TOTAL) BY MOUTH AT BEDTIME. HELPS SLOW DOWN MEMORY LOSS. 90 tablet 3   famotidine (PEPCID) 40 MG tablet Take 40 mg by mouth 2 (two) times daily.     Glucagon (GVOKE HYPOPEN 2-PACK) 1 MG/0.2ML SOAJ Inject 1 each into the skin as needed. 0.2 mL 1   insulin aspart (NOVOLOG) 100 UNIT/ML FlexPen Inject 0-6 Units into the skin 3 (three) times daily with meals. Use Sliding Scale per instructions provided. 15 mL 11   JARDIANCE 10 MG TABS tablet Take 10 mg by mouth daily.     latanoprost (XALATAN) 0.005 % ophthalmic solution Place 1 drop into both eyes at bedtime.  5   levothyroxine (SYNTHROID) 50 MCG tablet Take 1 tablet (50 mcg total) by mouth daily. 90 tablet 1   metFORMIN (GLUCOPHAGE) 500 MG tablet Take 1,000 mg by mouth 2 (two) times daily.     NOVOLOG MIX 70/30 FLEXPEN (70-30) 100 UNIT/ML FlexPen Inject 5 Units into the skin 2 (two) times daily with a meal. 60 units in the morning with breakfast, 10 units with lunch, 60 units in the evening with dinner. (Patient taking differently: Inject 5 Units into the skin 2 (two) times daily with a meal.) 15 mL 3   spironolactone (ALDACTONE) 25 MG tablet Take 0.5 tablets (12.5 mg total) by mouth daily. 45 tablet 1   No current facility-administered medications on file prior to visit.     The following portions of the patient's history were reviewed and updated as appropriate: allergies, current medications, past family history, past medical history, past social history, past surgical history and problem list.  ROS Otherwise as in subjective above    Objective: BP (!) 142/70   Pulse 68   Wt 176 lb 6.4 oz (80 kg)   LMP  (LMP Unknown)   BMI 32.26 kg/m   Wt Readings from Last 3 Encounters:  09/23/22 176 lb  6.4 oz (80 kg)  09/07/22 179 lb (81.2 kg)  09/03/22 178 lb (80.7 kg)   BP Readings from Last 3 Encounters:  09/23/22 (!) 142/70  09/22/22 (!) 131/53  09/17/22 (!) 127/51   General appearance: alert, no distress, well developed, well nourished Heart: RRR, normal S1, S2, no murmurs Lungs: CTA bilaterally, no wheezes, rhonchi, or rales Pulses: 2+ radial pulses, 2+ pedal pulses, normal cap refill Ext: no edema Psych: Seems down and mood in general Neuro: CN II through XII intact, nonfocal exam   Assessment: Encounter Diagnoses  Name Primary?   Hyperlipidemia, unspecified hyperlipidemia type Yes   Essential hypertension  Dyslipidemia    Coronary artery disease due to lipid rich plaque    Hypothyroidism, unspecified type    Vitamin D deficiency    Type 2 diabetes mellitus with other specified complication, with long-term current use of insulin (HCC)    Elevated uric acid in blood    Medication management    Hypoglycemia associated with diabetes (HCC)    High risk medication use    Chronic gout without tophus, unspecified cause, unspecified site    Long term (current) use of insulin (HCC)    Diabetic polyneuropathy associated with type 2 diabetes mellitus (HCC)    Polypharmacy      Plan We reviewed over her last visit notes, medication discussion from that visit.  I have sent a message to referral coordinator to check on the status of her endocrinology referral.  I have placed a new referral to pharmacy consult for medication review and advice on anything to help her polypharmacy issues as well as cost  Thankfully her recent blood sugar readings are looking much better and at goal.    To help the polypharmacy situation I did change her separate amlodipine and losartan to combo amlodipine valsartan today.  To help with mood and pain I increased her Cymbalta to 60 mg daily.  Update lipid profile today since she is fasting  I reviewed her recent discharge summary notes,  medicines were reconciled.  There were several concerns outlined below as we discussed her hospital follow-up issues today.  She is burdened by polypharmacy.  She apparently has been seeing another primary care office in conjunction with [place] which can only confuse the matter.  I recommended she either pick [place] her name but not have 2 different primary care offices.  We will request the recent echocardiogram she had done through [facility].  She does not want to be taking as many medications.  We discussed that some of her medicines are available in combo forms but not sure if insurance will pay for these or not.   Decreased appetite long-term -  can continue Ensure for meal replacement if needed.  She does not want to add another pill such as cyproheptadine to help with appetite.    Depression-increase dose of cymbalta today  GERD-she knows that she needs to continue on her famotidine so we will continue that for now  Hypothyroidism-managed by endocrinology, but I do not have their most recent thyroid labs or records.   Memory loss-continues on Aricept per neurology  Hyperlipidemia-Zetia was discontinued at the hospital.  Continue atorvastatin 80 mg, and lets plan to recheck lipids in 6 to 8 weeks fasting  History of elevated uric acid and gout-continue allopurinol 100 mg daily   Sheryle was seen today for other.  Diagnoses and all orders for this visit:  Hyperlipidemia, unspecified hyperlipidemia type -     Lipid panel  Essential hypertension  Dyslipidemia  Coronary artery disease due to lipid rich plaque  Hypothyroidism, unspecified type  Vitamin D deficiency  Type 2 diabetes mellitus with other specified complication, with long-term current use of insulin (HCC)  Elevated uric acid in blood  Medication management  Hypoglycemia associated with diabetes (HCC)  High risk medication use  Chronic gout without tophus, unspecified cause, unspecified site  Long term  (current) use of insulin (HCC)  Diabetic polyneuropathy associated with type 2 diabetes mellitus (HCC)  Polypharmacy -     AMB Referral to Community Care Coordinaton ([facility] Patients)  Other orders -  amLODipine-valsartan (EXFORGE) 10-160 MG tablet; Take 1 tablet by mouth daily. -     DULoxetine (CYMBALTA) 60 MG capsule; Take 1 capsule (60 mg total) by mouth daily.  Follow up: 6-8 weeks

## 2022-09-23 NOTE — Patient Instructions (Signed)
Summary from today: I am checking into the status of your endocrinology referral since you have not heard back from last visit I am also checking to the status of your pharmacist referral from last visit.  Company changed this service when you are here last so I need to set up a different referral.  This is for a medication review I have changed your blood pressure regimen today.  To reduce your pill burden, we will stop plain amlodipine and plain losartan and switch you to a combo pill amlodipine valsartan.  This will reduce 2 of your pills down to 1 I increased your Cymbalta to 60 mg to help with mood and this can also sometimes help with pain Your recent blood sugars look great so continue your current diabetes regimen We are checking a blood test today for cholesterol and we will call back with that result

## 2022-09-24 ENCOUNTER — Telehealth: Payer: Self-pay

## 2022-09-24 LAB — LIPID PANEL
Chol/HDL Ratio: 2.8 ratio (ref 0.0–4.4)
Cholesterol, Total: 125 mg/dL (ref 100–199)
HDL: 45 mg/dL (ref >39)
LDL Chol Calc (NIH): 53 mg/dL (ref 0–99)
Triglycerides: 157 mg/dL — ABNORMAL HIGH (ref 0–149)
VLDL Cholesterol Cal: 27 mg/dL (ref 5–40)

## 2022-09-24 NOTE — Progress Notes (Signed)
Results sent through MyChart

## 2022-09-24 NOTE — Progress Notes (Signed)
   Care Guide Note  09/24/2022 Name: JUWANA THORESON MRN: [id number] DOB: 1945-07-12  Referred by: Jac Canavan, PA-C Reason for referral : Care Coordination (Outreach to schedule with Pharm d )   Shelly Blevins is a 77 y.o. year old female who is a primary care patient of Genia Del. Joneen Caraway was referred to the pharmacist for assistance related to DM.    Successful contact was made with the patient to discuss pharmacy services including being ready for the pharmacist to call at least 5 minutes before the scheduled appointment time, to have medication bottles and any blood sugar or blood pressure readings ready for review. The patient agreed to meet with the pharmacist via with the pharmacist via telephone visit on (date/time).  10/12/2022  Penne Lash, RMA Care Guide [facility]  [place], [place] [address] Direct Dial: [phone] [email or web address]

## 2022-09-25 ENCOUNTER — Ambulatory Visit: Payer: Medicare HMO | Admitting: Physical Therapy

## 2022-09-29 ENCOUNTER — Other Ambulatory Visit: Payer: Self-pay | Admitting: Medical

## 2022-09-29 ENCOUNTER — Ambulatory Visit: Payer: Medicare HMO | Admitting: Physical Therapy

## 2022-09-29 VITALS — BP 145/55 | HR 54

## 2022-09-29 DIAGNOSIS — R2689 Other abnormalities of gait and mobility: Secondary | ICD-10-CM

## 2022-09-29 DIAGNOSIS — R2681 Unsteadiness on feet: Secondary | ICD-10-CM | POA: Insufficient documentation

## 2022-09-29 DIAGNOSIS — M6281 Muscle weakness (generalized): Secondary | ICD-10-CM | POA: Insufficient documentation

## 2022-09-29 DIAGNOSIS — R29898 Other symptoms and signs involving the musculoskeletal system: Secondary | ICD-10-CM | POA: Insufficient documentation

## 2022-09-29 DIAGNOSIS — R42 Dizziness and giddiness: Secondary | ICD-10-CM | POA: Insufficient documentation

## 2022-09-29 NOTE — Therapy (Signed)
OUTPATIENT PHYSICAL THERAPY NEURO TREATMENT- ARRIVED NO CHARGE   Patient Name: Shelly Blevins MRN: [id number] DOB:July 21, 1945, 77 y.o., female Today's Date: 09/29/2022   PCP: Benard Rink  REFERRING PROVIDER: Lanae Boast, MD  END OF SESSION:  PT End of Session - 09/29/22 0935     Visit Number 5   arrived no charge   Number of Visits 17    Date for PT Re-Evaluation 10/28/22    Authorization Type [employer] MCR    Authorization Time Period 09/02/22 to 10/28/22    Progress Note Due on Visit 10    PT Start Time 0933    PT Stop Time 1001   Arrived no charge   PT Time Calculation (min) 28 min    Equipment Utilized During Treatment Gait belt    Activity Tolerance Patient limited by fatigue;Treatment limited secondary to medical complications (Comment)   Hyperglycemia   Behavior During Therapy WFL for tasks assessed/performed             Past Medical History:  Diagnosis Date   Allergy    Arthritis    CAD (coronary artery disease)    mild nonobstructive disease, 30% Cfx lesion   Cataract    Diabetes mellitus    insulin-dependent; Dr. Talmage Nap   Dyslipidemia    GERD (gastroesophageal reflux disease)    Hyperlipidemia    Hypertension    Hypothyroidism    Dr. Talmage Nap   Stroke [facility])    Past Surgical History:  Procedure Laterality Date   ABDOMINAL HYSTERECTOMY     CARDIAC CATHETERIZATION  02/17/2007   mild CAD with 50% prox L Cfx stenosis, normal LV function (Dr. Claudia Desanctis)   CHOLECYSTECTOMY     EYE SURGERY     NM MYOCAR PERF WALL MOTION  2009   lexiscan myoview - perfusion defect in anterior myociaral region (breast attenuation) - low risk scan - EF 69%   THYROIDECTOMY     TRANSTHORACIC ECHOCARDIOGRAM  2008   normal study    Patient Active Problem List   Diagnosis Date Noted   Medication management 09/03/2022   History of gout 09/03/2022   Fall 08/19/2022   Hypoglycemia associated with diabetes (HCC) 08/19/2022   Near syncope 08/19/2022   Elevated uric acid in  blood 05/12/2021   Chronic gout without tophus 05/12/2021   High risk medication use 05/12/2021   Anemia 01/23/2021   Diabetic neuropathy (HCC) 01/15/2021   Long term (current) use of insulin (HCC) 01/15/2021   Diabetes mellitus (HCC) 12/31/2020   Hypertrophy of nail 12/31/2020   Pain in joints of both feet 12/31/2020   Abnormal ankle brachial index (ABI) 12/31/2020   Encounter for health maintenance examination in adult 04/25/2020   Medicare annual wellness visit, subsequent 04/25/2020   Memory change 04/25/2020   Post-menopausal 04/25/2020   Constipation 02/29/2020   Osteoarthritis of both knees 01/10/2020   Chronic pain of both knees 03/28/2019   Vitamin D deficiency 03/28/2019   Ataxia 11/02/2016   Estrogen deficiency 11/02/2016   Vaccine counseling 10/09/2015   Advanced directives, counseling/discussion 10/09/2015   Need for prophylactic vaccination against Streptococcus pneumoniae (pneumococcus) 10/09/2015   Hypothyroidism 04/17/2015   Essential hypertension 06/06/2013   Dyslipidemia 06/06/2013   Coronary artery disease due to lipid rich plaque 06/06/2013   GERD 10/29/2008    ONSET DATE: 08/19/22  REFERRING DIAG: R55 (ICD-10-CM) - Near syncope  THERAPY DIAG:  Muscle weakness (generalized)  Unsteadiness on feet  Other abnormalities of gait and mobility  Rationale for Evaluation  and Treatment: Rehabilitation  SUBJECTIVE:                                                                                                                                                                                             SUBJECTIVE STATEMENT: Patient states she is having more right shoulder pain today, rating it as a 5/10. Rating fatigue as a 7/10   Pt accompanied by:  grand-daughter not present  PERTINENT HISTORY: OA, CAD, DM, HLD, HTN, hypothyroidism, CVA, hx cardiac cath, hx cholecystectomy  PAIN:  Are you having pain? Yes: NPRS scale: 5/10 Pain location: right  shoulder Pain description: throbbing Aggravating factors: random Relieving factors: nothing  PRECAUTIONS: Fall  WEIGHT BEARING RESTRICTIONS: No  FALLS: Has patient fallen in last 6 months? Yes. Number of falls 2- fall that brought her to the ED, slipped off EOB to the floor on another occasion, no FOF   LIVING ENVIRONMENT: Lives with: lives with their family Lives in: House/apartment Stairs: ramped entrance, no steps inside home  Has following equipment at home: shower chair, Grab bars, and Ramped entry  PLOF: Independent, Independent with basic ADLs, Independent with gait, and Independent with transfers  PATIENT GOALS: be able to walk again "not a long ways but be able to walk about a block"   OBJECTIVE:   DIAGNOSTIC FINDINGS: Right hand wrist elbow and shoulder pain swelling x 1 wk [facility] at [facility] -xray done, worried abt steroid use due to her Dm per family, xray and blood worse were done there- was told it is from Rheumatoid arthritis-she will need to follow-up with rheumatology outpatient Added scheduled Tylenol 1000 mg, ibuprofen as needed  PT OT evaluations - need rehab needs.  IMPRESSION: 1.  No evidence of an acute intracranial abnormality. 2. Mild chronic small vessel ischemic changes within the cerebral white matter. 3. Mild generalized parenchymal atrophy. 4. Bilateral proptosis.   Left ankle pain and numbness- now mostly pain Hx of gout: on allupurinol.  COGNITION: Overall cognitive status: Within functional limits for tasks assessed  VITALS Vitals:   09/29/22 0937 09/29/22 0943  BP: (!) 150/56 (!) 145/55  Pulse: (!) 56 (!) 54  SpO2: 99%       TODAY'S TREATMENT:  Ther Act  Assessed vitals (see above) and systolic BP more elevated today. Pt reports she has not picked up her new prescriptions yet, cannot remember if she took her  BP meds this morning. Blood glucose at 190 mg/dL at beginning of session. Pt states she took 5 units of insulin prior to session. Closely monitored vitals through remainder of session  Ther Ex  SciFit multi-peaks level 6 for 8 minutes using BUE/BLEs for neural priming for reciprocal movement, dynamic cardiovascular warmup and global strength. Blood glucose at 221 mg/dL and RPE of 1/61 following activity. Halted remainder of session due to pt's hyperglycemia. Encouraged pt to call pharmacy regarding updates to prescriptions and to closely monitor glucose for remainder of day. Pt verbalized understanding.      PATIENT EDUCATION: Education details: Discussed blood glucose parameters, encouraged calling pharmacy regarding updated medications  Person educated: Patient Education method: Explanation Education comprehension: verbalized understanding and needs further education  HOME EXERCISE PROGRAM: You Can Walk For A Certain Length Of Time Each Day                          Walk 5 minutes 1-2 times per day.             Increase 1-2  minutes every 7 days              Work up to 10 minutes (1-2 times per day).               Example:                         Day 1-2           4-5 minutes     3 times per day                         Day 7-8           10-12 minutes 2-3 times per day                         Day 13-14       20-22 minutes 1-2 times per day  Access Code: [email or web address] URL: [email or web address] Date: 09/10/2022 Prepared by: Maryruth Eve  Exercises - Sit to Stand with Arms Crossed  - 1 x daily - 4-5 x weekly - 2 sets - 10 reps - Side Stepping with Resistance at Thighs and Counter Support  - 1 x daily - 4 x weekly - 3-4 sets - 10 reps - Seated March with Resistance  - 1 x daily - 5 x weekly - 2 sets - 20 reps - Seated Hamstring Curls with Resistance  - 1 x daily - 5 x weekly - 2 sets - 10 reps - Corner Balance Feet Together: Eyes Closed With Head Turns  - 1 x daily - 7 x  weekly - 3 sets - 10 reps - Tandem Stance  - 1 x daily - 7 x weekly - 3 sets - 10 reps  GOALS: Goals reviewed with patient? Yes  SHORT TERM GOALS: Target date: 09/30/2022    Will be compliant with appropriate progressive HEP with MinA from family  Baseline: Goal status: INITIAL  2.  Subjective complaints of whole body stiffness to have improved by 50%  Baseline:  Goal status: INITIAL  3.  Pt and family to be able to verbalize  3 ways to reduce fall risk at home and in the community  Baseline:  Goal status: INITIAL  4.  Will be compliant with daily 10 minute walking program with family in order to improve general functional activity tolerance and complaints of fatigue  Baseline:  Goal status: INITIAL  LONG TERM GOALS: Target date: 10/28/2022   MMT to improve by at least 1 grade in all weak groups  Baseline:  Goal status: INITIAL  2.  Will score at least 43 on Berg balance test in order to show reduced fall risk Baseline:  Goal status: INITIAL  3.  Will be able to ambulate at least 457ft in with LRAD in order to improve community access  Baseline: 240' no AD (6/11) Goal status: REVISED-6/11 as no baseline present from evaluation.  4.  Will be compliant with appropriate advanced HEP with MinA from family in addition to regular advanced walking program (20-30 minutes at least 3x/week) in order to maintain functional gains from PT  Baseline:  Goal status: INITIAL    ASSESSMENT:  CLINICAL IMPRESSION: Arrived no charge due to hyperglycemia. Encouraged pt to contact pharmacy regarding updates to medications made by Dr. Aleen Campi last week, as pt has no recollection of this.   OBJECTIVE IMPAIRMENTS: Abnormal gait, decreased activity tolerance, decreased balance, decreased cognition, decreased coordination, decreased mobility, difficulty walking, decreased ROM, decreased strength, hypomobility, impaired perceived functional ability, and pain.   ACTIVITY LIMITATIONS:  standing, squatting, transfers, bed mobility, and locomotion level  PARTICIPATION LIMITATIONS: cleaning, laundry, shopping, community activity, and church  PERSONAL FACTORS: Age, Behavior pattern, Education, Fitness, Past/current experiences, Social background, and Time since onset of injury/illness/exacerbation are also affecting patient's functional outcome.   REHAB POTENTIAL: Good  CLINICAL DECISION MAKING: Stable/uncomplicated  EVALUATION COMPLEXITY: Low  PLAN:  PT FREQUENCY: 2x/week  PT DURATION: 8 weeks  PLANNED INTERVENTIONS: Therapeutic exercises, Therapeutic activity, Neuromuscular re-education, Gait training, Self Care,  and Re-evaluation  PLAN FOR NEXT SESSION: Goal assessment, monitor blood glucose. monitor reports of vertigo and re-examine PRN. Otherwise focus on functional strength and balance, functional activity tolerance. Reactive stepping-try retro perturbations again, modified SLS activities, dynamic/static balance, check in on patient's energy levels, forward bending seated > standing tasks, core engagement for BP support if contributing to dizziness/lightheadedness  Jill Alexanders Duha Abair, PT, DPT [facility] [address] [place], [place]  [address] Phone:  [phone] Fax:  [phone]

## 2022-10-02 ENCOUNTER — Ambulatory Visit: Payer: Medicare HMO | Attending: Internal Medicine | Admitting: Physical Therapy

## 2022-10-06 ENCOUNTER — Ambulatory Visit: Payer: Medicare HMO | Admitting: Physical Therapy

## 2022-10-07 DIAGNOSIS — F4321 Adjustment disorder with depressed mood: Secondary | ICD-10-CM | POA: Diagnosis not present

## 2022-10-07 DIAGNOSIS — F331 Major depressive disorder, recurrent, moderate: Secondary | ICD-10-CM | POA: Diagnosis not present

## 2022-10-08 ENCOUNTER — Ambulatory Visit: Payer: Medicare HMO | Admitting: Physical Therapy

## 2022-10-12 ENCOUNTER — Other Ambulatory Visit: Payer: Medicare HMO

## 2022-10-12 DIAGNOSIS — E7800 Pure hypercholesterolemia, unspecified: Secondary | ICD-10-CM | POA: Diagnosis not present

## 2022-10-12 DIAGNOSIS — E039 Hypothyroidism, unspecified: Secondary | ICD-10-CM | POA: Diagnosis not present

## 2022-10-12 DIAGNOSIS — E1165 Type 2 diabetes mellitus with hyperglycemia: Secondary | ICD-10-CM | POA: Diagnosis not present

## 2022-10-12 DIAGNOSIS — N1831 Chronic kidney disease, stage 3a: Secondary | ICD-10-CM | POA: Diagnosis not present

## 2022-10-12 DIAGNOSIS — L84 Corns and callosities: Secondary | ICD-10-CM | POA: Diagnosis not present

## 2022-10-12 DIAGNOSIS — I10 Essential (primary) hypertension: Secondary | ICD-10-CM | POA: Diagnosis not present

## 2022-10-13 ENCOUNTER — Other Ambulatory Visit (INDEPENDENT_AMBULATORY_CARE_PROVIDER_SITE_OTHER): Payer: Medicare HMO

## 2022-10-13 ENCOUNTER — Ambulatory Visit: Payer: Medicare HMO | Admitting: Gastroenterology

## 2022-10-13 ENCOUNTER — Encounter: Payer: Self-pay | Admitting: Physical Therapy

## 2022-10-13 ENCOUNTER — Other Ambulatory Visit: Payer: Self-pay | Admitting: Medical

## 2022-10-13 ENCOUNTER — Ambulatory Visit: Payer: Medicare HMO | Admitting: Physical Therapy

## 2022-10-13 ENCOUNTER — Other Ambulatory Visit: Payer: Self-pay | Admitting: Internal Medicine

## 2022-10-13 ENCOUNTER — Encounter: Payer: Self-pay | Admitting: Gastroenterology

## 2022-10-13 VITALS — BP 125/78 | HR 65

## 2022-10-13 VITALS — BP 120/54 | HR 64 | Ht 62.5 in | Wt 174.5 lb

## 2022-10-13 DIAGNOSIS — R140 Abdominal distension (gaseous): Secondary | ICD-10-CM

## 2022-10-13 DIAGNOSIS — R634 Abnormal weight loss: Secondary | ICD-10-CM

## 2022-10-13 DIAGNOSIS — M6281 Muscle weakness (generalized): Secondary | ICD-10-CM | POA: Diagnosis not present

## 2022-10-13 DIAGNOSIS — R1084 Generalized abdominal pain: Secondary | ICD-10-CM

## 2022-10-13 DIAGNOSIS — R2689 Other abnormalities of gait and mobility: Secondary | ICD-10-CM

## 2022-10-13 DIAGNOSIS — K5900 Constipation, unspecified: Secondary | ICD-10-CM

## 2022-10-13 DIAGNOSIS — R29898 Other symptoms and signs involving the musculoskeletal system: Secondary | ICD-10-CM

## 2022-10-13 DIAGNOSIS — R42 Dizziness and giddiness: Secondary | ICD-10-CM

## 2022-10-13 DIAGNOSIS — R2681 Unsteadiness on feet: Secondary | ICD-10-CM

## 2022-10-13 LAB — BASIC METABOLIC PANEL
BUN: 27 mg/dL — ABNORMAL HIGH (ref 6–23)
CO2: 27 mEq/L (ref 19–32)
Calcium: 9.6 mg/dL (ref 8.4–10.5)
Chloride: 98 mEq/L (ref 96–112)
Creatinine, Ser: 1.15 mg/dL (ref 0.40–1.20)
GFR: 45.52 mL/min — ABNORMAL LOW (ref >60.00)
Glucose, Bld: 137 mg/dL — ABNORMAL HIGH (ref 70–99)
Potassium: 4.2 mEq/L (ref 3.5–5.1)
Sodium: 135 mEq/L (ref 135–145)

## 2022-10-13 MED ORDER — LINACLOTIDE 72 MCG PO CAPS: 72.0000 ug | ORAL_CAPSULE | Freq: Every day | ORAL | 3 refills | Status: DC

## 2022-10-13 NOTE — Patient Instructions (Signed)
Your provider has requested that you go to the basement level for lab work before leaving today. Press "B" on the elevator. The lab is located at the first door on the left as you exit the elevator.  We have sent the following medications to your pharmacy for you to pick up at your convenience: Linzess 72 mcg daily before breakfast (samples).  You have been scheduled for a CT scan of the abdomen and pelvis at [facility], 1st floor Radiology. You are scheduled on Friday 10/23/22 at 3 pm. Please arrive at 1 pm to drink oral contrast.  Please follow the written instructions below on the day of your exam:   1) Do not eat anything after 11 am (4 hours prior to your test)   You may take any medications as prescribed with a small amount of water, if necessary. If you take any of the following medications: METFORMIN, GLUCOPHAGE, GLUCOVANCE, AVANDAMET, RIOMET, FORTAMET, ACTOPLUS MET, JANUMET, GLUMETZA or METAGLIP, you MAY be asked to HOLD this medication 48 hours AFTER the exam.   The purpose of you drinking the oral contrast is to aid in the visualization of your intestinal tract. The contrast solution may cause some diarrhea. Depending on your individual set of symptoms, you may also receive an intravenous injection of x-ray contrast/dye. Plan on being at [facility] for 45 minutes or longer, depending on the type of exam you are having performed.   If you have any questions regarding your exam or if you need to reschedule, you may call [facility] at [phone] between the hours of 8:00 am and 5:00 pm, Monday-Friday.

## 2022-10-13 NOTE — Progress Notes (Signed)
   10/13/2022  Patient ID: Shelly Blevins, female   DOB: 1945-07-08, 77 y.o.   MRN: [id number]  Telephone visit to follow-up on patient assistance program for Jardiance.  -PAP application was denied, because patient must first apply for and be denied LIS through Medicare Extra Help -Patient is currently getting Jardiance for $45/month -She endorses previously applying for and being denied LIS and believes she has a denial letter at home -If patient is unable to find letter, we will reapply; and if denied, provide denial to Jardiance PAP  Telephone follow-up scheduled for Friday to see if patient was able to location LIS denial letter.  Lenna Gilford, PharmD, DPLA

## 2022-10-13 NOTE — Therapy (Signed)
OUTPATIENT PHYSICAL THERAPY NEURO TREATMENT   Patient Name: Shelly Blevins MRN: [id number] DOB:March 26, 1946, 77 y.o., female Today's Date: 10/13/2022   PCP: Benard Rink  REFERRING PROVIDER: Lanae Boast, MD  END OF SESSION:  PT End of Session - 10/13/22 0940     Visit Number 6    Number of Visits 17    Date for PT Re-Evaluation 10/28/22    Authorization Type [employer] MCR    Authorization Time Period 09/02/22 to 10/28/22    Progress Note Due on Visit 10    PT Start Time 0935    PT Stop Time 1018    PT Time Calculation (min) 43 min    Equipment Utilized During Treatment Gait belt    Activity Tolerance Patient limited by fatigue;Patient tolerated treatment well    Behavior During Therapy WFL for tasks assessed/performed             Past Medical History:  Diagnosis Date   Allergy    Arthritis    CAD (coronary artery disease)    mild nonobstructive disease, 30% Cfx lesion   Cataract    Diabetes mellitus    insulin-dependent; Dr. Talmage Nap   Dyslipidemia    GERD (gastroesophageal reflux disease)    Hyperlipidemia    Hypertension    Hypothyroidism    Dr. Talmage Nap   Stroke [facility])    Past Surgical History:  Procedure Laterality Date   ABDOMINAL HYSTERECTOMY     CARDIAC CATHETERIZATION  02/17/2007   mild CAD with 50% prox L Cfx stenosis, normal LV function (Dr. Claudia Desanctis)   CHOLECYSTECTOMY     EYE SURGERY     NM MYOCAR PERF WALL MOTION  2009   lexiscan myoview - perfusion defect in anterior myociaral region (breast attenuation) - low risk scan - EF 69%   THYROIDECTOMY     TRANSTHORACIC ECHOCARDIOGRAM  2008   normal study    Patient Active Problem List   Diagnosis Date Noted   Medication management 09/03/2022   History of gout 09/03/2022   Fall 08/19/2022   Hypoglycemia associated with diabetes (HCC) 08/19/2022   Near syncope 08/19/2022   Elevated uric acid in blood 05/12/2021   Chronic gout without tophus 05/12/2021   High risk medication use 05/12/2021    Anemia 01/23/2021   Diabetic neuropathy (HCC) 01/15/2021   Long term (current) use of insulin (HCC) 01/15/2021   Diabetes mellitus (HCC) 12/31/2020   Hypertrophy of nail 12/31/2020   Pain in joints of both feet 12/31/2020   Abnormal ankle brachial index (ABI) 12/31/2020   Encounter for health maintenance examination in adult 04/25/2020   Medicare annual wellness visit, subsequent 04/25/2020   Memory change 04/25/2020   Post-menopausal 04/25/2020   Constipation 02/29/2020   Osteoarthritis of both knees 01/10/2020   Chronic pain of both knees 03/28/2019   Vitamin D deficiency 03/28/2019   Ataxia 11/02/2016   Estrogen deficiency 11/02/2016   Vaccine counseling 10/09/2015   Advanced directives, counseling/discussion 10/09/2015   Need for prophylactic vaccination against Streptococcus pneumoniae (pneumococcus) 10/09/2015   Hypothyroidism 04/17/2015   Essential hypertension 06/06/2013   Dyslipidemia 06/06/2013   Coronary artery disease due to lipid rich plaque 06/06/2013   GERD 10/29/2008    ONSET DATE: 08/19/22  REFERRING DIAG: R55 (ICD-10-CM) - Near syncope  THERAPY DIAG:  Muscle weakness (generalized)  Unsteadiness on feet  Other abnormalities of gait and mobility  Other symptoms and signs involving the musculoskeletal system  Dizziness and giddiness  Rationale for Evaluation and Treatment: Rehabilitation  SUBJECTIVE:                                                                                                                                                                                             SUBJECTIVE STATEMENT: Pt enters clinic no AD w/ daughter.  She is bumping into wall on right without correction and states she still feels stiff and foggy, but denies changes in past 3 weeks of missed appts.  R shoulder hurts, denies falls.  She is 7/10 in terms of fatigue this morning and thinks she slept last night.  Her blood sugar is 148 at onset of session.  Pt  accompanied by:  grand-daughter not present  PERTINENT HISTORY: OA, CAD, DM, HLD, HTN, hypothyroidism, CVA, hx cardiac cath, hx cholecystectomy  PAIN:  Are you having pain? Yes: NPRS scale: 10/10 Pain location: right shoulder Pain description: throbbing Aggravating factors: random Relieving factors: nothing  PRECAUTIONS: Fall  WEIGHT BEARING RESTRICTIONS: No  FALLS: Has patient fallen in last 6 months? Yes. Number of falls 2- fall that brought her to the ED, slipped off EOB to the floor on another occasion, no FOF   LIVING ENVIRONMENT: Lives with: lives with their family Lives in: House/apartment Stairs: ramped entrance, no steps inside home  Has following equipment at home: shower chair, Grab bars, and Ramped entry  PLOF: Independent, Independent with basic ADLs, Independent with gait, and Independent with transfers  PATIENT GOALS: be able to walk again "not a long ways but be able to walk about a block"   OBJECTIVE:   DIAGNOSTIC FINDINGS: Right hand wrist elbow and shoulder pain swelling x 1 wk [facility] -xray done, worried abt steroid use due to her Dm per family, xray and blood worse were done there- was told it is from Rheumatoid arthritis-she will need to follow-up with rheumatology outpatient Added scheduled Tylenol 1000 mg, ibuprofen as needed  PT OT evaluations - need rehab needs.  IMPRESSION: 1.  No evidence of an acute intracranial abnormality. 2. Mild chronic small vessel ischemic changes within the cerebral white matter. 3. Mild generalized parenchymal atrophy. 4. Bilateral proptosis.   Left ankle pain and numbness- now mostly pain Hx of gout: on allupurinol.  COGNITION: Overall cognitive status: Within functional limits for tasks assessed  VITALS Vitals:   10/13/22 0938  BP: 125/78  Pulse: 65      TODAY'S TREATMENT:  Ther Act  -Verbally reviewed HEP, pt reports these remain challenging and she still has paper for these.  Reviewed walking program, patient is able to walk around entirety of [employer] per her and daughter's report. -See STG section for further.  Printed and reviewed fall prevention handout.  NMR: -Seated 8" cone taps in semi-circle x5 each direction > repeated in standing w/ intermittent CGA, in sitting cued to readjust on EOM as needed for safety, increased time to complete in standing due to needed rest from knee stiffness per report -Retro stepping 2x30' w/ continuous resistance -Retro stepping x30' and x15' w/ multi-directional perturbations for reactive stepping, pt has significant cross-over step and LOB requiring modA to return to upright due to reported fatigue -Unsupported mini squats on airex x15 SBA  PATIENT EDUCATION: Education details: Discussed right shoulder issues and contacting PCP regarding further assessment if desired.  Did mention variable possibilities as patient has had bone spur in left shoulder, mentioned having assessment for frozen shoulder due to hyperglycemia vs other orthopedic issues and MD interventions for pain management. Person educated: Patient Education method: Explanation Education comprehension: verbalized understanding and needs further education  HOME EXERCISE PROGRAM: You Can Walk For A Certain Length Of Time Each Day                          Walk 5 minutes 1-2 times per day.             Increase 1-2  minutes every 7 days              Work up to 10 minutes (1-2 times per day).               Example:                         Day 1-2           4-5 minutes     3 times per day                         Day 7-8           10-12 minutes 2-3 times per day                         Day 13-14       20-22 minutes 1-2 times per day  Access Code: [email or web address] URL: [email or web address] Date: 09/10/2022 Prepared by: Maryruth Eve  Exercises - Sit to  Stand with Arms Crossed  - 1 x daily - 4-5 x weekly - 2 sets - 10 reps - Side Stepping with Resistance at Thighs and Counter Support  - 1 x daily - 4 x weekly - 3-4 sets - 10 reps - Seated March with Resistance  - 1 x daily - 5 x weekly - 2 sets - 20 reps - Seated Hamstring Curls with Resistance  - 1 x daily - 5 x weekly - 2 sets - 10 reps - Corner Balance Feet Together: Eyes Closed With Head Turns  - 1 x daily - 7 x weekly - 3 sets - 10 reps - Tandem Stance  - 1 x daily - 7 x weekly - 3 sets - 10 reps  Provided fall prevention handout 7/16.  GOALS: Goals reviewed with patient? Yes  SHORT TERM GOALS: Target date: 09/30/2022    Will be compliant with appropriate  progressive HEP with MinA from family  Baseline:  Pt is intermittently compliant (7/16) Goal status: IN PROGRESS  2.  Subjective complaints of whole body stiffness to have improved by 50%  Baseline: Pt feels stiffness has not changed (7/16) Goal status: NOT MET  3.  Pt and family to be able to verbalize 3 ways to reduce fall risk at home and in the community  Baseline: Provided fall prevention handout w/ teachback of strategies (7/16) Goal status: IN PROGRESS  4.  Will be compliant with daily 10 minute walking program with family in order to improve general functional activity tolerance and complaints of fatigue  Baseline: 2-3 days of 10 minutes of continuous walking per report (7/16) Goal status: IN PROGRESS  LONG TERM GOALS: Target date: 10/28/2022   MMT to improve by at least 1 grade in all weak groups  Baseline:  Goal status: INITIAL  2.  Will score at least 43 on Berg balance test in order to show reduced fall risk Baseline:  Goal status: INITIAL  3.  Will be able to ambulate at least 458ft in with LRAD in order to improve community access  Baseline: 240' no AD (6/11) Goal status: REVISED-6/11 as no baseline present from evaluation.  4.  Will be compliant with appropriate advanced HEP with MinA from family  in addition to regular advanced walking program (20-30 minutes at least 3x/week) in order to maintain functional gains from PT  Baseline:  Goal status: INITIAL    ASSESSMENT:  CLINICAL IMPRESSION: Assessed STGs this visit with patient working towards and making progress towards all goals as written.  She is doing some of her HEP and walking program at home, but may benefit from review and modification at future session.  She would benefit from further skilled PT intervention to address environmental scanning, dynamic balance strategies, and improved gait mechanics and stability.  Discussed POC with patient and daughter today with recommendations made as appropriate.  OBJECTIVE IMPAIRMENTS: Abnormal gait, decreased activity tolerance, decreased balance, decreased cognition, decreased coordination, decreased mobility, difficulty walking, decreased ROM, decreased strength, hypomobility, impaired perceived functional ability, and pain.   ACTIVITY LIMITATIONS: standing, squatting, transfers, bed mobility, and locomotion level  PARTICIPATION LIMITATIONS: cleaning, laundry, shopping, community activity, and church  PERSONAL FACTORS: Age, Behavior pattern, Education, Fitness, Past/current experiences, Social background, and Time since onset of injury/illness/exacerbation are also affecting patient's functional outcome.   REHAB POTENTIAL: Good  CLINICAL DECISION MAKING: Stable/uncomplicated  EVALUATION COMPLEXITY: Low  PLAN:  PT FREQUENCY: 2x/week  PT DURATION: 8 weeks  PLANNED INTERVENTIONS: Therapeutic exercises, Therapeutic activity, Neuromuscular re-education, Gait training, Self Care,  and Re-evaluation  PLAN FOR NEXT SESSION: monitor blood glucose. monitor reports of vertigo and re-examine PRN. Otherwise focus on functional strength and balance, functional activity tolerance.  modified SLS activities, dynamic/static balance, check in on patient's energy levels, LE and hemibody  stretching (RUE limited), core engagement for BP support if contributing to dizziness/lightheadedness, ball toss on tilt board/airex, visual scanning tasks or dual tasking, SciFit warmup - requested by patient and daughter, turning tasks  Camille Bal, PT, DPT [facility] [address] [place], [place]  [address] Phone:  [phone] Fax:  [phone]

## 2022-10-13 NOTE — Progress Notes (Signed)
10/13/2022 Shelly Blevins [id number] Jan 31, 1946   HISTORY OF PRESENT ILLNESS: This is a 77 year old female who is a patient of Dr. Lamar Sprinkles.  She is here today with her daughter with complaints of constipation.  She says that she cannot have a good bowel movement unless she takes something to help her go.  She says that she does not take anything regularly, has been using magnesium citrate about once a week or so.  Otherwise she only has a very small bowel movement about once a week without that.  She says that her stomach makes a lot of noise she has a lot of gas and bloating.  She has it feels very full and that she does not want to eat.  Generalized abdominal discomfort.  Her weight is down 16 pounds since November 2023.  Colonoscopy October 2020 with only 2 small polyps removed.   Past Medical History:  Diagnosis Date   Allergy    Arthritis    CAD (coronary artery disease)    mild nonobstructive disease, 30% Cfx lesion   Cataract    Depression    Diabetes mellitus    insulin-dependent; Dr. Talmage Nap   Dyslipidemia    GERD (gastroesophageal reflux disease)    Hyperlipidemia    Hypertension    Hypothyroidism    Dr. Talmage Nap   Stroke [facility])    Past Surgical History:  Procedure Laterality Date   ABDOMINAL HYSTERECTOMY     CARDIAC CATHETERIZATION  02/17/2007   mild CAD with 50% prox L Cfx stenosis, normal LV function (Dr. Claudia Desanctis)   CATARACT EXTRACTION Left    CHOLECYSTECTOMY     NM MYOCAR PERF WALL MOTION  2009   lexiscan myoview - perfusion defect in anterior myociaral region (breast attenuation) - low risk scan - EF 69%   THYROIDECTOMY     TRANSTHORACIC ECHOCARDIOGRAM  2008   normal study     reports that she quit smoking about 36 years ago. Her smoking use included cigarettes. She has never used smokeless tobacco. She reports that she does not drink alcohol and does not use drugs. family history includes Bone cancer in her daughter; Dementia in her sister; Diabetes in her  brother, daughter, daughter, father, mother, and sister; Heart failure in her mother; Heart failure (age of onset: 46) in her brother; Hyperlipidemia in her daughter; Hypertension in her daughter. No Known Allergies    Outpatient Encounter Medications as of 10/13/2022  Medication Sig   acetaminophen (TYLENOL) 500 MG tablet Take 1,000 mg by mouth every 6 (six) hours as needed for mild pain or moderate pain.   albuterol (VENTOLIN HFA) 108 (90 Base) MCG/ACT inhaler Inhale 2 puffs into the lungs every 6 (six) hours as needed for wheezing or shortness of breath.   Alcohol Swabs (GLOBAL ALCOHOL PREP EASE) 70 % PADS USE AS DIRECTED TO test to check blood sugar AND FOR insulin USE   allopurinol (ZYLOPRIM) 100 MG tablet Take 1 tablet (100 mg total) by mouth daily.   amLODipine-valsartan (EXFORGE) 10-160 MG tablet Take 1 tablet by mouth daily.   aspirin EC 81 MG tablet Take 1 tablet (81 mg total) by mouth daily.   atorvastatin (LIPITOR) 80 MG tablet Take 1 tablet (80 mg total) by mouth every evening.   BD PEN NEEDLE NANO 2ND GEN 32G X 4 MM MISC 2 (two) times daily. as directed   Continuous Glucose Receiver (FREESTYLE LIBRE 3 READER) DEVI 1 Device by Does not apply route daily.  Continuous Glucose Sensor (FREESTYLE LIBRE 3 SENSOR) MISC PLACE 1 SENSOR ON THE SKIN Q14 DAYS TO CHECK BLOOD GLUCOSE CONTINUOUSLY   donepezil (ARICEPT) 10 MG tablet TAKE 1 TABLET (10 MG TOTAL) BY MOUTH AT BEDTIME. HELPS SLOW DOWN MEMORY LOSS.   DULoxetine (CYMBALTA) 60 MG capsule Take 1 capsule (60 mg total) by mouth daily.   ezetimibe (ZETIA) 10 MG tablet Take 10 mg by mouth daily.   famotidine (PEPCID) 40 MG tablet Take 40 mg by mouth 2 (two) times daily.   Glucagon (GVOKE HYPOPEN 2-PACK) 1 MG/0.2ML SOAJ Inject 1 each into the skin as needed.   insulin aspart (NOVOLOG) 100 UNIT/ML FlexPen Inject 0-6 Units into the skin 3 (three) times daily with meals. Use Sliding Scale per instructions provided.   JARDIANCE 10 MG TABS tablet  Take 10 mg by mouth daily.   latanoprost (XALATAN) 0.005 % ophthalmic solution Place 1 drop into both eyes at bedtime.   levothyroxine (SYNTHROID) 50 MCG tablet Take 1 tablet (50 mcg total) by mouth daily.   metFORMIN (GLUCOPHAGE) 500 MG tablet Take 1,000 mg by mouth 2 (two) times daily.   Multiple Vitamins-Minerals (PRESERVISION AREDS 2 PO) Take 2 capsules by mouth daily.   NOVOLOG MIX 70/30 FLEXPEN (70-30) 100 UNIT/ML FlexPen Inject 5 Units into the skin 2 (two) times daily with a meal. 60 units in the morning with breakfast, 10 units with lunch, 60 units in the evening with dinner. (Patient taking differently: Inject 5 Units into the skin 2 (two) times daily with a meal.)   spironolactone (ALDACTONE) 25 MG tablet Take 0.5 tablets (12.5 mg total) by mouth daily.   No facility-administered encounter medications on file as of 10/13/2022.     REVIEW OF SYSTEMS  : All other systems reviewed and negative except where noted in the History of Present Illness.   PHYSICAL EXAM: BP (!) 120/54 (BP Location: Left Arm, Patient Position: Sitting, Cuff Size: Normal)   Pulse 64   Ht 5' 2.5" (1.588 m) Comment: height measured without shoes  Wt 174 lb 8 oz (79.2 kg)   LMP  (LMP Unknown)   BMI 31.41 kg/m  General: Well developed AA female in no acute distress Head: Normocephalic and atraumatic Eyes:  Sclerae anicteric, conjunctiva pink. Ears: Normal auditory acuity Lungs: Clear throughout to auscultation; no W/R/R. Heart: Regular rate and rhythm; no M/R/G. Abdomen: Soft, non-distended.  BS present.  Non-tender. Musculoskeletal: Symmetrical with no gross deformities  Skin: No lesions on visible extremities Extremities: No edema  Neurological: Alert oriented x 4, grossly non-focal Psychological:  Alert and cooperative. Normal mood and affect  ASSESSMENT AND PLAN: *77 year old female with complaints of constipation, generalized abdominal discomfort, and bloating.  She has lost 16 pounds since  November 2023.  Will plan for CT scan of the abdomen/pelvis with contrast if her renal function allows.  Will check a BMP today.  Otherwise can plan for p.o. contrast only.  Will treat her constipation with Linzess 72 mcg daily.  She was given samples and prescription sent to pharmacy.   CC:  Tysinger, Kermit Balo, PA-C

## 2022-10-13 NOTE — Progress Notes (Signed)
 Noted  

## 2022-10-14 ENCOUNTER — Telehealth: Payer: Self-pay | Admitting: Gastroenterology

## 2022-10-14 NOTE — Telephone Encounter (Signed)
Inbound call from patient requesting a call back in regards Linzess medication. Please advise

## 2022-10-14 NOTE — Telephone Encounter (Signed)
Patient called and states the Linzess is to expensive. Patient states she will just use the MIralax OTC. Informed patient to try the samples that was provided yesterday and would see if we can send something different in. Patient informed provider out of the office and will address when she returns.

## 2022-10-15 ENCOUNTER — Ambulatory Visit: Payer: Medicare HMO | Admitting: Physical Therapy

## 2022-10-15 VITALS — BP 141/58 | HR 55

## 2022-10-15 DIAGNOSIS — R29898 Other symptoms and signs involving the musculoskeletal system: Secondary | ICD-10-CM | POA: Diagnosis not present

## 2022-10-15 DIAGNOSIS — M6281 Muscle weakness (generalized): Secondary | ICD-10-CM

## 2022-10-15 DIAGNOSIS — R42 Dizziness and giddiness: Secondary | ICD-10-CM | POA: Diagnosis not present

## 2022-10-15 DIAGNOSIS — R2689 Other abnormalities of gait and mobility: Secondary | ICD-10-CM | POA: Diagnosis not present

## 2022-10-15 DIAGNOSIS — R2681 Unsteadiness on feet: Secondary | ICD-10-CM | POA: Diagnosis not present

## 2022-10-15 NOTE — Therapy (Signed)
OUTPATIENT PHYSICAL THERAPY NEURO TREATMENT   Patient Name: Shelly Blevins MRN: [id number] DOB:[birth year], 77 y.o., female Today's Date: 10/15/2022   PCP: Benard Rink  REFERRING PROVIDER: Lanae Boast, MD  END OF SESSION:  PT End of Session - 10/15/22 0937     Visit Number 7    Number of Visits 17    Date for PT Re-Evaluation 10/28/22    Authorization Type [employer] MCR    Authorization Time Period 09/02/22 to 10/28/22    Progress Note Due on Visit 10    PT Start Time 0935    PT Stop Time 1014    PT Time Calculation (min) 39 min    Equipment Utilized During Treatment Gait belt    Activity Tolerance Patient tolerated treatment well    Behavior During Therapy WFL for tasks assessed/performed             Past Medical History:  Diagnosis Date   Allergy    Arthritis    CAD (coronary artery disease)    mild nonobstructive disease, 30% Cfx lesion   Cataract    Depression    Diabetes mellitus    insulin-dependent; Dr. Talmage Nap   Dyslipidemia    GERD (gastroesophageal reflux disease)    Hyperlipidemia    Hypertension    Hypothyroidism    Dr. Talmage Nap   Stroke [facility])    Past Surgical History:  Procedure Laterality Date   ABDOMINAL HYSTERECTOMY     CARDIAC CATHETERIZATION  02/17/2007   mild CAD with 50% prox L Cfx stenosis, normal LV function (Dr. Claudia Desanctis)   CATARACT EXTRACTION Left    CHOLECYSTECTOMY     NM MYOCAR PERF WALL MOTION  2009   lexiscan myoview - perfusion defect in anterior myociaral region (breast attenuation) - low risk scan - EF 69%   THYROIDECTOMY     TRANSTHORACIC ECHOCARDIOGRAM  2008   normal study    Patient Active Problem List   Diagnosis Date Noted   Loss of weight 10/13/2022   Medication management 09/03/2022   History of gout 09/03/2022   Fall 08/19/2022   Hypoglycemia associated with diabetes (HCC) 08/19/2022   Near syncope 08/19/2022   Elevated uric acid in blood 05/12/2021   Chronic gout without tophus 05/12/2021   High risk  medication use 05/12/2021   Anemia 01/23/2021   Diabetic neuropathy (HCC) 01/15/2021   Long term (current) use of insulin (HCC) 01/15/2021   Diabetes mellitus (HCC) 12/31/2020   Hypertrophy of nail 12/31/2020   Pain in joints of both feet 12/31/2020   Abnormal ankle brachial index (ABI) 12/31/2020   Encounter for health maintenance examination in adult 04/25/2020   Medicare annual wellness visit, subsequent 04/25/2020   Memory change 04/25/2020   Post-menopausal 04/25/2020   Generalized abdominal pain 02/29/2020   Constipation 02/29/2020   Bloating 02/29/2020   Osteoarthritis of both knees 01/10/2020   Chronic pain of both knees 03/28/2019   Vitamin D deficiency 03/28/2019   Ataxia 11/02/2016   Estrogen deficiency 11/02/2016   Vaccine counseling 10/09/2015   Advanced directives, counseling/discussion 10/09/2015   Need for prophylactic vaccination against Streptococcus pneumoniae (pneumococcus) 10/09/2015   Hypothyroidism 04/17/2015   Essential hypertension 06/06/2013   Dyslipidemia 06/06/2013   Coronary artery disease due to lipid rich plaque 06/06/2013   GERD 10/29/2008    ONSET DATE: 08/19/22  REFERRING DIAG: R55 (ICD-10-CM) - Near syncope  THERAPY DIAG:  Muscle weakness (generalized)  Unsteadiness on feet  Other abnormalities of gait and mobility  Rationale for  Evaluation and Treatment: Rehabilitation  SUBJECTIVE:                                                                                                                                                                                             SUBJECTIVE STATEMENT: Pt enters clinic w/o AD. Rating fatigue as a 7/10 today. Having a little bit of pain today but not bad. No falls   Pt accompanied by:  grand-daughter not present  PERTINENT HISTORY: OA, CAD, DM, HLD, HTN, hypothyroidism, CVA, hx cardiac cath, hx cholecystectomy  PAIN:  Are you having pain? Yes: NPRS scale: 1/10 Pain location: right  shoulder Pain description: throbbing Aggravating factors: random Relieving factors: nothing  PRECAUTIONS: Fall  WEIGHT BEARING RESTRICTIONS: No  FALLS: Has patient fallen in last 6 months? Yes. Number of falls 2- fall that brought her to the ED, slipped off EOB to the floor on another occasion, no FOF   LIVING ENVIRONMENT: Lives with: lives with their family Lives in: House/apartment Stairs: ramped entrance, no steps inside home  Has following equipment at home: shower chair, Grab bars, and Ramped entry  PLOF: Independent, Independent with basic ADLs, Independent with gait, and Independent with transfers  PATIENT GOALS: be able to walk again "not a long ways but be able to walk about a block"   OBJECTIVE:   DIAGNOSTIC FINDINGS: Right hand wrist elbow and shoulder pain swelling x 1 wk [facility] -[facility], worried abt steroid use due to her Dm per family, xray and blood worse were done there- was told it is from Rheumatoid arthritis-she will need to follow-up with rheumatology outpatient Added scheduled Tylenol 1000 mg, ibuprofen as needed  PT OT evaluations - need rehab needs.  IMPRESSION: 1.  No evidence of an acute intracranial abnormality. 2. Mild chronic small vessel ischemic changes within the cerebral white matter. 3. Mild generalized parenchymal atrophy. 4. Bilateral proptosis.   Left ankle pain and numbness- now mostly pain Hx of gout: on allupurinol.  COGNITION: Overall cognitive status: Within functional limits for tasks assessed  VITALS Vitals:   10/15/22 0939  BP: (!) 141/58  Pulse: (!) 55    Glucose at beginning of session: 130 mg/dL    TODAY'S TREATMENT:  Ther Act  Assessed vitals (see above) and within limits for therapy   Ther Ex  Per pt request, SciFit multi-peaks level 5 for 10 minutes using BUE/BLEs for neural  priming for reciprocal movement, dynamic cardiovascular warmup and global strength. RPE of 0/10 following activity   NMR  In // bars for improved BLE strength, single leg stability and anticipatory balance strategies:  Alternating 6" step ups w/10# KB on one side, x15 per side w/unilateral UE support. Increased difficulty stepping up w/ LLE > RLE. CGA throughout  Lateral eccentric heel taps, x15 reps per side w/BUE support. Min cues for soft landing of foot and upright posture  Standing on rockerboard in A/P direction, ball tosses w/second therapist x3 minutes w/min A due to posterior LOB.  Added cog dual-task (naming animals) w/each ball toss and noted increased posterior LOB episodes w/added task.  Weighted squats holding 8# med ball, x25 reps w/proper form on 70% of reps, for improved BLE strength and endurance. Mod verbal cues to facilitate proper squat rather than hip hinge as pt fatigued.   Glucose at 131 mg/dL and fatigue as 0/98 at end of session  PATIENT EDUCATION: Education details: Continue HEP  Person educated: Patient Education method: Explanation Education comprehension: verbalized understanding and needs further education  HOME EXERCISE PROGRAM: You Can Walk For A Certain Length Of Time Each Day                          Walk 5 minutes 1-2 times per day.             Increase 1-2  minutes every 7 days              Work up to 10 minutes (1-2 times per day).               Example:                         Day 1-2           4-5 minutes     3 times per day                         Day 7-8           10-12 minutes 2-3 times per day                         Day 13-14       20-22 minutes 1-2 times per day  Access Code: [email or web address] URL: [email or web address] Date: 09/10/2022 Prepared by: Maryruth Eve  Exercises - Sit to Stand with Arms Crossed  - 1 x daily - 4-5 x weekly - 2 sets - 10 reps - Side Stepping with Resistance at Thighs and Counter Support  - 1 x daily - 4  x weekly - 3-4 sets - 10 reps - Seated March with Resistance  - 1 x daily - 5 x weekly - 2 sets - 20 reps - Seated Hamstring Curls with Resistance  - 1 x daily - 5 x weekly - 2 sets - 10 reps - Corner Balance Feet Together: Eyes Closed With Head Turns  - 1 x daily - 7 x weekly - 3 sets - 10 reps - Tandem Stance  - 1 x daily - 7 x weekly - 3 sets - 10 reps  Provided fall prevention handout 7/16.  GOALS: Goals reviewed with patient? Yes  SHORT TERM GOALS: Target date: 09/30/2022    Will be compliant with appropriate progressive HEP with MinA from family  Baseline:  Pt is intermittently compliant (7/16) Goal status: IN PROGRESS  2.  Subjective complaints of whole body stiffness to have improved by 50%  Baseline: Pt feels stiffness has not changed (7/16) Goal status: NOT MET  3.  Pt and family to be able to verbalize 3 ways to reduce fall risk at home and in the community  Baseline: Provided fall prevention handout w/ teachback of strategies (7/16) Goal status: IN PROGRESS  4.  Will be compliant with daily 10 minute walking program with family in order to improve general functional activity tolerance and complaints of fatigue  Baseline: 2-3 days of 10 minutes of continuous walking per report (7/16) Goal status: IN PROGRESS  LONG TERM GOALS: Target date: 10/28/2022   MMT to improve by at least 1 grade in all weak groups  Baseline:  Goal status: INITIAL  2.  Will score at least 43 on Berg balance test in order to show reduced fall risk Baseline:  Goal status: INITIAL  3.  Will be able to ambulate at least 478ft in with LRAD in order to improve community access  Baseline: 240' no AD (6/11) Goal status: REVISED-6/11 as no baseline present from evaluation.  4.  Will be compliant with appropriate advanced HEP with MinA from family in addition to regular advanced walking program (20-30 minutes at least 3x/week) in order to maintain functional gains from PT  Baseline:  Goal  status: INITIAL    ASSESSMENT:  CLINICAL IMPRESSION: Emphasis of skilled PT session on endurance, single leg stability and anticipatory balance strategies. Pt reporting 7/10 fatigue at beginning and end of session despite reporting difficulty w/PT interventions. Pt tolerated session well and was most challenged by addition of dual-task, in which she frequently lost her balance posteriorly. Continue POC.   OBJECTIVE IMPAIRMENTS: Abnormal gait, decreased activity tolerance, decreased balance, decreased cognition, decreased coordination, decreased mobility, difficulty walking, decreased ROM, decreased strength, hypomobility, impaired perceived functional ability, and pain.   ACTIVITY LIMITATIONS: standing, squatting, transfers, bed mobility, and locomotion level  PARTICIPATION LIMITATIONS: cleaning, laundry, shopping, community activity, and church  PERSONAL FACTORS: Age, Behavior pattern, Education, Fitness, Past/current experiences, Social background, and Time since onset of injury/illness/exacerbation are also affecting patient's functional outcome.   REHAB POTENTIAL: Good  CLINICAL DECISION MAKING: Stable/uncomplicated  EVALUATION COMPLEXITY: Low  PLAN:  PT FREQUENCY: 2x/week  PT DURATION: 8 weeks  PLANNED INTERVENTIONS: Therapeutic exercises, Therapeutic activity, Neuromuscular re-education, Gait training, Self Care,  and Re-evaluation  PLAN FOR NEXT SESSION: monitor blood glucose. monitor reports of vertigo and re-examine PRN. Otherwise focus on functional strength and balance, functional activity tolerance.  modified SLS activities, dynamic/static balance, check in on patient's energy levels, LE and hemibody stretching (RUE limited), core engagement for BP support if contributing to dizziness/lightheadedness, ball toss on tilt board/airex, visual scanning tasks or dual tasking, SciFit warmup - requested by patient and daughter, turning tasks  Josephine Igo, PT,  DPT [facility] [address] [place], [place]  [address] Phone:  [phone] Fax:  [phone]

## 2022-10-16 ENCOUNTER — Other Ambulatory Visit: Payer: Medicare HMO

## 2022-10-20 ENCOUNTER — Ambulatory Visit: Payer: Medicare HMO | Admitting: Physical Therapy

## 2022-10-20 ENCOUNTER — Encounter: Payer: Self-pay | Admitting: Physical Therapy

## 2022-10-20 VITALS — BP 148/55 | HR 53

## 2022-10-20 DIAGNOSIS — R29898 Other symptoms and signs involving the musculoskeletal system: Secondary | ICD-10-CM | POA: Diagnosis not present

## 2022-10-20 DIAGNOSIS — M6281 Muscle weakness (generalized): Secondary | ICD-10-CM

## 2022-10-20 DIAGNOSIS — R2689 Other abnormalities of gait and mobility: Secondary | ICD-10-CM

## 2022-10-20 DIAGNOSIS — R42 Dizziness and giddiness: Secondary | ICD-10-CM | POA: Diagnosis not present

## 2022-10-20 DIAGNOSIS — R2681 Unsteadiness on feet: Secondary | ICD-10-CM

## 2022-10-20 NOTE — Therapy (Signed)
OUTPATIENT PHYSICAL THERAPY NEURO TREATMENT   Patient Name: Shelly Blevins MRN: [id number] DOB:11/04/1945, 77 y.o., female Today's Date: 10/20/2022   PCP: Benard Rink  REFERRING PROVIDER: Lanae Boast, MD  END OF SESSION:  PT End of Session - 10/20/22 0941     Visit Number 8    Number of Visits 17    Date for PT Re-Evaluation 10/28/22    Authorization Type [employer] MCR    Authorization Time Period 09/02/22 to 10/28/22    Progress Note Due on Visit 10    PT Start Time 0933    PT Stop Time 1015    PT Time Calculation (min) 42 min    Equipment Utilized During Treatment Gait belt    Activity Tolerance Patient tolerated treatment well;Patient limited by fatigue    Behavior During Therapy WFL for tasks assessed/performed             Past Medical History:  Diagnosis Date   Allergy    Arthritis    CAD (coronary artery disease)    mild nonobstructive disease, 30% Cfx lesion   Cataract    Depression    Diabetes mellitus    insulin-dependent; Dr. Talmage Nap   Dyslipidemia    GERD (gastroesophageal reflux disease)    Hyperlipidemia    Hypertension    Hypothyroidism    Dr. Talmage Nap   Stroke [facility])    Past Surgical History:  Procedure Laterality Date   ABDOMINAL HYSTERECTOMY     CARDIAC CATHETERIZATION  02/17/2007   mild CAD with 50% prox L Cfx stenosis, normal LV function (Dr. Claudia Desanctis)   CATARACT EXTRACTION Left    CHOLECYSTECTOMY     NM MYOCAR PERF WALL MOTION  2009   lexiscan myoview - perfusion defect in anterior myociaral region (breast attenuation) - low risk scan - EF 69%   THYROIDECTOMY     TRANSTHORACIC ECHOCARDIOGRAM  2008   normal study    Patient Active Problem List   Diagnosis Date Noted   Loss of weight 10/13/2022   Medication management 09/03/2022   History of gout 09/03/2022   Fall 08/19/2022   Hypoglycemia associated with diabetes (HCC) 08/19/2022   Near syncope 08/19/2022   Elevated uric acid in blood 05/12/2021   Chronic gout without  tophus 05/12/2021   High risk medication use 05/12/2021   Anemia 01/23/2021   Diabetic neuropathy (HCC) 01/15/2021   Long term (current) use of insulin (HCC) 01/15/2021   Diabetes mellitus (HCC) 12/31/2020   Hypertrophy of nail 12/31/2020   Pain in joints of both feet 12/31/2020   Abnormal ankle brachial index (ABI) 12/31/2020   Encounter for health maintenance examination in adult 04/25/2020   Medicare annual wellness visit, subsequent 04/25/2020   Memory change 04/25/2020   Post-menopausal 04/25/2020   Generalized abdominal pain 02/29/2020   Constipation 02/29/2020   Bloating 02/29/2020   Osteoarthritis of both knees 01/10/2020   Chronic pain of both knees 03/28/2019   Vitamin D deficiency 03/28/2019   Ataxia 11/02/2016   Estrogen deficiency 11/02/2016   Vaccine counseling 10/09/2015   Advanced directives, counseling/discussion 10/09/2015   Need for prophylactic vaccination against Streptococcus pneumoniae (pneumococcus) 10/09/2015   Hypothyroidism 04/17/2015   Essential hypertension 06/06/2013   Dyslipidemia 06/06/2013   Coronary artery disease due to lipid rich plaque 06/06/2013   GERD 10/29/2008    ONSET DATE: 08/19/22  REFERRING DIAG: R55 (ICD-10-CM) - Near syncope  THERAPY DIAG:  Muscle weakness (generalized)  Unsteadiness on feet  Other abnormalities of gait and mobility  Rationale for Evaluation and Treatment: Rehabilitation  SUBJECTIVE:                                                                                                                                                                                             SUBJECTIVE STATEMENT: Pt enters clinic w/o AD walking with more bilateral foot scuff stating it is a thicker pair of shoes than she is used to.  She denies falls or acute changes.  Patient states her glucose monitor has not worked since Sunday and she did not bring it to clinic today.  133 when checked by finger prick this morning.  Pt  accompanied by:  grand-daughter not present , dropped pt off  PERTINENT HISTORY: OA, CAD, DM, HLD, HTN, hypothyroidism, CVA, hx cardiac cath, hx cholecystectomy  PAIN:  Are you having pain? Yes: NPRS scale: 2/10 Pain location: right shoulder Pain description: throbbing Aggravating factors: random Relieving factors: nothing  PRECAUTIONS: Fall  WEIGHT BEARING RESTRICTIONS: No  FALLS: Has patient fallen in last 6 months? Yes. Number of falls 2- fall that brought her to the ED, slipped off EOB to the floor on another occasion, no FOF   LIVING ENVIRONMENT: Lives with: lives with their family Lives in: House/apartment Stairs: ramped entrance, no steps inside home  Has following equipment at home: shower chair, Grab bars, and Ramped entry  PLOF: Independent, Independent with basic ADLs, Independent with gait, and Independent with transfers  PATIENT GOALS: be able to walk again "not a long ways but be able to walk about a block"   OBJECTIVE:   DIAGNOSTIC FINDINGS: Right hand wrist elbow and shoulder pain swelling x 1 wk [facility] at [facility] -xray done, worried abt steroid use due to her Dm per family, xray and blood worse were done there- was told it is from Rheumatoid arthritis-she will need to follow-up with rheumatology outpatient Added scheduled Tylenol 1000 mg, ibuprofen as needed  PT OT evaluations - need rehab needs.  IMPRESSION: 1.  No evidence of an acute intracranial abnormality. 2. Mild chronic small vessel ischemic changes within the cerebral white matter. 3. Mild generalized parenchymal atrophy. 4. Bilateral proptosis.   Left ankle pain and numbness- now mostly pain Hx of gout: on allupurinol.  COGNITION: Overall cognitive status: Within functional limits for tasks assessed  VITALS Vitals:   10/20/22 0940  BP: (!) 148/55  Pulse: (!) 53    Glucose at beginning of session: 130 mg/dL    TODAY'S TREATMENT:  Assessed BP at onset of session on LUE in sitting: Today's Vitals   10/20/22 0940  BP: (!) 148/55  Pulse: (!) 53   -SciFit in hill mode up x8 minutes to level 5.0 using BUE/BLE for dynamic cardiovascular warmup, endurance training, and reciprocal mobility.  Cued for increased amplitude of movement.  Pt states relief in right shoulder with activity. 6 Blaze pods on random one color taps setting for improved visual scanning and dynamic SLS.  Performed on 2 minute intervals with 30 second rest periods.  Pt requires SBA-CGA guarding. Round 1:  blue mat lateral stepping setup.  36 hits. Round 2:  " setup.  25 hits. Notable errors/deficits:  Need for intermittent UE support on // bars and standing/seated rest due to fatigue. 6 Blaze pods on random one color taps setting for improved dynamic SLS and visual scanning.  Performed on 2 minute intervals with 30 second rest periods.  Pt requires CGA guarding. Round 1:  blue mat and 2-4" hurdles setup.  19 hits. Round 2:  " setup.  25 hits. Notable errors/deficits:  More fatigue notable and more wobble in SLS, more frequent bouts of BUE support on bars  -PT attempting to check-in on patient fatigue using modified BORG scale throughout session w/ pt reaffirming she is not tired.  Notable ankle weakness as session progresses, dyspnea on exertion, and patient needing seated and standing rest throughout.  She continues to endorse that her shoes are impacting her more than her fatigue, but she does have an episode of toe catch and stumble requiring minA to recover following end of session activity.  PATIENT EDUCATION: Education details: Continue HEP.  Contact company about helping with monitor and continue checking glucose with finger stick as able until other is fixed.  Person educated: Patient Education method: Explanation Education comprehension: verbalized understanding and needs further  education  HOME EXERCISE PROGRAM: You Can Walk For A Certain Length Of Time Each Day                          Walk 5 minutes 1-2 times per day.             Increase 1-2  minutes every 7 days              Work up to 10 minutes (1-2 times per day).               Example:                         Day 1-2           4-5 minutes     3 times per day                         Day 7-8           10-12 minutes 2-3 times per day                         Day 13-14       20-22 minutes 1-2 times per day  Access Code: [email or web address] URL: [email or web address] Date: 09/10/2022 Prepared by: Maryruth Eve  Exercises - Sit to Stand with Arms Crossed  - 1 x daily - 4-5 x weekly - 2 sets - 10 reps - Side Stepping with Resistance at Thighs and Counter Support  - 1 x daily -  4 x weekly - 3-4 sets - 10 reps - Seated March with Resistance  - 1 x daily - 5 x weekly - 2 sets - 20 reps - Seated Hamstring Curls with Resistance  - 1 x daily - 5 x weekly - 2 sets - 10 reps - Corner Balance Feet Together: Eyes Closed With Head Turns  - 1 x daily - 7 x weekly - 3 sets - 10 reps - Tandem Stance  - 1 x daily - 7 x weekly - 3 sets - 10 reps  Provided fall prevention handout 7/16.  GOALS: Goals reviewed with patient? Yes  SHORT TERM GOALS: Target date: 09/30/2022    Will be compliant with appropriate progressive HEP with MinA from family  Baseline:  Pt is intermittently compliant (7/16) Goal status: IN PROGRESS  2.  Subjective complaints of whole body stiffness to have improved by 50%  Baseline: Pt feels stiffness has not changed (7/16) Goal status: NOT MET  3.  Pt and family to be able to verbalize 3 ways to reduce fall risk at home and in the community  Baseline: Provided fall prevention handout w/ teachback of strategies (7/16) Goal status: IN PROGRESS  4.  Will be compliant with daily 10 minute walking program with family in order to improve general functional activity tolerance and complaints of  fatigue  Baseline: 2-3 days of 10 minutes of continuous walking per report (7/16) Goal status: IN PROGRESS  LONG TERM GOALS: Target date: 10/28/2022   MMT to improve by at least 1 grade in all weak groups  Baseline:  Goal status: INITIAL  2.  Will score at least 43 on Berg balance test in order to show reduced fall risk Baseline:  Goal status: INITIAL  3.  Will be able to ambulate at least 49ft in with LRAD in order to improve community access  Baseline: 240' no AD (6/11) Goal status: REVISED-6/11 as no baseline present from evaluation.  4.  Will be compliant with appropriate advanced HEP with MinA from family in addition to regular advanced walking program (20-30 minutes at least 3x/week) in order to maintain functional gains from PT  Baseline:  Goal status: INITIAL    ASSESSMENT:  CLINICAL IMPRESSION: Patient is limited by fatigue today though she seems less insightful about indications of fatigue like changes in breathing, dynamic instability and gait pattern.  Continued to work on dynamic SLS and visual scanning as this remains problematic for patient.  She continues to benefit from skilled PT to improve safety with dynamic mobility and may benefit from consideration of an AD in the future to help improve midline during gait.  OBJECTIVE IMPAIRMENTS: Abnormal gait, decreased activity tolerance, decreased balance, decreased cognition, decreased coordination, decreased mobility, difficulty walking, decreased ROM, decreased strength, hypomobility, impaired perceived functional ability, and pain.   ACTIVITY LIMITATIONS: standing, squatting, transfers, bed mobility, and locomotion level  PARTICIPATION LIMITATIONS: cleaning, laundry, shopping, community activity, and church  PERSONAL FACTORS: Age, Behavior pattern, Education, Fitness, Past/current experiences, Social background, and Time since onset of injury/illness/exacerbation are also affecting patient's functional outcome.    REHAB POTENTIAL: Good  CLINICAL DECISION MAKING: Stable/uncomplicated  EVALUATION COMPLEXITY: Low  PLAN:  PT FREQUENCY: 2x/week  PT DURATION: 8 weeks  PLANNED INTERVENTIONS: Therapeutic exercises, Therapeutic activity, Neuromuscular re-education, Gait training, Self Care,  and Re-evaluation  PLAN FOR NEXT SESSION: monitor blood glucose - did she get her monitor fixed? monitor reports of vertigo and re-examine PRN. Otherwise focus on functional strength and balance, functional  activity tolerance.  modified SLS activities, dynamic/static balance, check in on patient's energy levels, LE and hemibody stretching (RUE limited), core engagement for BP support if contributing to dizziness/lightheadedness, ball toss on tilt board/airex, visual scanning tasks or dual tasking, SciFit warmup - requested by patient and daughter, turning tasks, pathfinding tasks/dynamic cone search or dual tasking with ambulation  Camille Bal, PT, DPT [facility] [address] [place], [place]  [address] Phone:  [phone] Fax:  [phone]

## 2022-10-21 ENCOUNTER — Other Ambulatory Visit: Payer: Medicare HMO

## 2022-10-21 NOTE — Progress Notes (Signed)
   10/21/2022  Patient ID: Shelly Blevins, female   DOB: 01/13/1946, 77 y.o.   MRN: [id number]  Telephone encounter to see if patient was able to locate a LIS denial letter, but she has not been able to.  I contacted the [employer], and they are not able to provide me with information around patient applications.  Ms. Barsch will need to call to verify an application was submitted and denied.  If denied, she will need to request another letter to be sent; so we can then apply for [employer] PAP.  Sending a MyChart message with this information and the phone number for the patient to call.  Lenna Gilford, PharmD, DPLA

## 2022-10-22 ENCOUNTER — Ambulatory Visit: Payer: Medicare HMO | Admitting: Physical Therapy

## 2022-10-22 VITALS — BP 140/51 | HR 67

## 2022-10-22 DIAGNOSIS — R2681 Unsteadiness on feet: Secondary | ICD-10-CM | POA: Diagnosis not present

## 2022-10-22 DIAGNOSIS — M6281 Muscle weakness (generalized): Secondary | ICD-10-CM

## 2022-10-22 DIAGNOSIS — R2689 Other abnormalities of gait and mobility: Secondary | ICD-10-CM

## 2022-10-22 DIAGNOSIS — R29898 Other symptoms and signs involving the musculoskeletal system: Secondary | ICD-10-CM | POA: Diagnosis not present

## 2022-10-22 DIAGNOSIS — R42 Dizziness and giddiness: Secondary | ICD-10-CM | POA: Diagnosis not present

## 2022-10-22 NOTE — Therapy (Signed)
OUTPATIENT PHYSICAL THERAPY NEURO TREATMENT   Patient Name: Shelly Blevins MRN: [id number] DOB:1945-10-08, 77 y.o., female Today's Date: 10/22/2022   PCP: Benard Rink  REFERRING PROVIDER: Lanae Boast, MD  END OF SESSION:  PT End of Session - 10/22/22 1026     Visit Number 9    Number of Visits 17    Date for PT Re-Evaluation 10/28/22    Authorization Type [employer] MCR    Authorization Time Period 09/02/22 to 10/28/22    Progress Note Due on Visit 10    PT Start Time 1025   Pt did not check in at front   PT Stop Time 1103    PT Time Calculation (min) 38 min    Equipment Utilized During Treatment Gait belt    Activity Tolerance Patient tolerated treatment well    Behavior During Therapy WFL for tasks assessed/performed             Past Medical History:  Diagnosis Date   Allergy    Arthritis    CAD (coronary artery disease)    mild nonobstructive disease, 30% Cfx lesion   Cataract    Depression    Diabetes mellitus    insulin-dependent; Dr. Talmage Nap   Dyslipidemia    GERD (gastroesophageal reflux disease)    Hyperlipidemia    Hypertension    Hypothyroidism    Dr. Talmage Nap   Stroke [facility])    Past Surgical History:  Procedure Laterality Date   ABDOMINAL HYSTERECTOMY     CARDIAC CATHETERIZATION  02/17/2007   mild CAD with 50% prox L Cfx stenosis, normal LV function (Dr. Claudia Desanctis)   CATARACT EXTRACTION Left    CHOLECYSTECTOMY     NM MYOCAR PERF WALL MOTION  2009   lexiscan myoview - perfusion defect in anterior myociaral region (breast attenuation) - low risk scan - EF 69%   THYROIDECTOMY     TRANSTHORACIC ECHOCARDIOGRAM  2008   normal study    Patient Active Problem List   Diagnosis Date Noted   Loss of weight 10/13/2022   Medication management 09/03/2022   History of gout 09/03/2022   Fall 08/19/2022   Hypoglycemia associated with diabetes (HCC) 08/19/2022   Near syncope 08/19/2022   Elevated uric acid in blood 05/12/2021   Chronic gout without  tophus 05/12/2021   High risk medication use 05/12/2021   Anemia 01/23/2021   Diabetic neuropathy (HCC) 01/15/2021   Long term (current) use of insulin (HCC) 01/15/2021   Diabetes mellitus (HCC) 12/31/2020   Hypertrophy of nail 12/31/2020   Pain in joints of both feet 12/31/2020   Abnormal ankle brachial index (ABI) 12/31/2020   Encounter for health maintenance examination in adult 04/25/2020   Medicare annual wellness visit, subsequent 04/25/2020   Memory change 04/25/2020   Post-menopausal 04/25/2020   Generalized abdominal pain 02/29/2020   Constipation 02/29/2020   Bloating 02/29/2020   Osteoarthritis of both knees 01/10/2020   Chronic pain of both knees 03/28/2019   Vitamin D deficiency 03/28/2019   Ataxia 11/02/2016   Estrogen deficiency 11/02/2016   Vaccine counseling 10/09/2015   Advanced directives, counseling/discussion 10/09/2015   Need for prophylactic vaccination against Streptococcus pneumoniae (pneumococcus) 10/09/2015   Hypothyroidism 04/17/2015   Essential hypertension 06/06/2013   Dyslipidemia 06/06/2013   Coronary artery disease due to lipid rich plaque 06/06/2013   GERD 10/29/2008    ONSET DATE: 08/19/22  REFERRING DIAG: R55 (ICD-10-CM) - Near syncope  THERAPY DIAG:  Muscle weakness (generalized)  Unsteadiness on feet  Other  abnormalities of gait and mobility  Rationale for Evaluation and Treatment: Rehabilitation  SUBJECTIVE:                                                                                                                                                                                             SUBJECTIVE STATEMENT: Pt entered clinic without AD. Reports her blood glucose was 113 this morning, does not have monitor fixed. Denies falls or pain. Reports 0/10 fatigue.   Pt accompanied by:  grand-daughter not present , dropped pt off  PERTINENT HISTORY: OA, CAD, DM, HLD, HTN, hypothyroidism, CVA, hx cardiac cath, hx  cholecystectomy  PAIN:  Are you having pain? No  PRECAUTIONS: Fall  WEIGHT BEARING RESTRICTIONS: No  FALLS: Has patient fallen in last 6 months? Yes. Number of falls 2- fall that brought her to the ED, slipped off EOB to the floor on another occasion, no FOF   LIVING ENVIRONMENT: Lives with: lives with their family Lives in: House/apartment Stairs: ramped entrance, no steps inside home  Has following equipment at home: shower chair, Grab bars, and Ramped entry  PLOF: Independent, Independent with basic ADLs, Independent with gait, and Independent with transfers  PATIENT GOALS: be able to walk again "not a long ways but be able to walk about a block"   OBJECTIVE:   DIAGNOSTIC FINDINGS: Right hand wrist elbow and shoulder pain swelling x 1 wk Seen at [facility] -xray done, worried abt steroid use due to her Dm per family, xray and blood worse were done there- was told it is from Rheumatoid arthritis-she will need to follow-up with rheumatology outpatient Added scheduled Tylenol 1000 mg, ibuprofen as needed  PT OT evaluations - need rehab needs.  IMPRESSION: 1.  No evidence of an acute intracranial abnormality. 2. Mild chronic small vessel ischemic changes within the cerebral white matter. 3. Mild generalized parenchymal atrophy. 4. Bilateral proptosis.   Left ankle pain and numbness- now mostly pain Hx of gout: on allupurinol.  COGNITION: Overall cognitive status: Within functional limits for tasks assessed  VITALS Vitals:   10/22/22 1029  BP: (!) 140/51  Pulse: 67     Glucose at beginning of session: 113 mg/dL    TODAY'S TREATMENT:  Ther Act  Assessed vitals (see above) and diastolic BP low but within limits for therapy. Pt denied lightheadedness, dizziness or fatigue. Provided water and closely monitored vitals throughout session  FOTO:  48  Ther Ex  SciFit multi-peaks level 8 for 8 minutes using BUE/BLEs for neural priming for reciprocal movement, dynamic cardiovascular warmup and increased amplitude of stepping. Min cues for full knee extension ROM throughout. Noted decreased amplitude of RLE >LLE. RPE of 1/10 following activity   NMR  In // bars for improved step clearance, single leg stability, LE coordination and anticipatory balance strategies: On blue balance beam, side stepping to random colored cone called out by therapist and double leg taps, x4 minutes w/BUE support. Min cues for pt to avoid rotation (pt trying to hold onto front rail w/LUE and back rail w/RUE). SBA throughout  Progressed to adding two 4" hurdles for pt to navigate x4 minutes. Pt frequently attempting to step around hurdles rather than over them despite max cues. Min cues for double cone taps, as pt only tapping w/RLE. RPE of 5/10 following activity.  Mini squats on rockerboard in A/P direction w/no UE support, x15 reps w/min A for posterior LOB correction. Min cues to shift weight anteriorly ("push down the gas pedal") but pt relying on forward trunk lean as compensation instead.   Vitals at end of session: BP 149/53 mmHg, HR 67 bpm, rated "windiness" as 6/10      PATIENT EDUCATION: Education details: Continue HEP.  Contact company about helping with monitor and continue checking glucose with finger stick as able until other is fixed. Ensuring she checks in at front desk when she arrives.  Person educated: Patient Education method: Explanation Education comprehension: verbalized understanding and needs further education  HOME EXERCISE PROGRAM: You Can Walk For A Certain Length Of Time Each Day                          Walk 5 minutes 1-2 times per day.             Increase 1-2  minutes every 7 days              Work up to 10 minutes (1-2 times per day).               Example:                         Day 1-2           4-5 minutes     3 times  per day                         Day 7-8           10-12 minutes 2-3 times per day                         Day 13-14       20-22 minutes 1-2 times per day  Access Code: [email or web address] URL: [email or web address] Date: 09/10/2022 Prepared by: Maryruth Eve  Exercises - Sit to Stand with Arms Crossed  - 1 x daily - 4-5 x weekly - 2 sets - 10 reps - Side Stepping with Resistance at Thighs and Counter Support  - 1 x daily - 4 x weekly - 3-4 sets - 10 reps - Seated March with Resistance  - 1 x daily -  5 x weekly - 2 sets - 20 reps - Seated Hamstring Curls with Resistance  - 1 x daily - 5 x weekly - 2 sets - 10 reps - Corner Balance Feet Together: Eyes Closed With Head Turns  - 1 x daily - 7 x weekly - 3 sets - 10 reps - Tandem Stance  - 1 x daily - 7 x weekly - 3 sets - 10 reps  Provided fall prevention handout 7/16.  GOALS: Goals reviewed with patient? Yes  SHORT TERM GOALS: Target date: 09/30/2022    Will be compliant with appropriate progressive HEP with MinA from family  Baseline:  Pt is intermittently compliant (7/16) Goal status: IN PROGRESS  2.  Subjective complaints of whole body stiffness to have improved by 50%  Baseline: Pt feels stiffness has not changed (7/16) Goal status: NOT MET  3.  Pt and family to be able to verbalize 3 ways to reduce fall risk at home and in the community  Baseline: Provided fall prevention handout w/ teachback of strategies (7/16) Goal status: IN PROGRESS  4.  Will be compliant with daily 10 minute walking program with family in order to improve general functional activity tolerance and complaints of fatigue  Baseline: 2-3 days of 10 minutes of continuous walking per report (7/16) Goal status: IN PROGRESS  LONG TERM GOALS: Target date: 10/28/2022   MMT to improve by at least 1 grade in all weak groups  Baseline:  Goal status: INITIAL  2.  Will score at least 43 on Berg balance test in order to show reduced fall risk Baseline:  Goal  status: INITIAL  3.  Will be able to ambulate at least 470ft in with LRAD in order to improve community access  Baseline: 240' no AD (6/11) Goal status: REVISED-6/11 as no baseline present from evaluation.  4.  Will be compliant with appropriate advanced HEP with MinA from family in addition to regular advanced walking program (20-30 minutes at least 3x/week) in order to maintain functional gains from PT  Baseline:  Goal status: INITIAL    ASSESSMENT:  CLINICAL IMPRESSION: Session limited as pt did not check in at front desk, so received late from therapist. Emphasis of skilled PT session on endurance, LE coordination, single leg stability and anticipatory balance strategies. Pt has not received working glucose monitor, but did check glucose prior to session. Pt's diastolic BP low throughout session but pt denied symptoms of hypotension. Pt challenged by balance beam task and required concurrent cues to maintain alternating sequence and avoid rotating on R shoulder throughout. Pt did report 6/10 "windiness" at end of session despite reporting no fatigue. Continue POC.   OBJECTIVE IMPAIRMENTS: Abnormal gait, decreased activity tolerance, decreased balance, decreased cognition, decreased coordination, decreased mobility, difficulty walking, decreased ROM, decreased strength, hypomobility, impaired perceived functional ability, and pain.   ACTIVITY LIMITATIONS: standing, squatting, transfers, bed mobility, and locomotion level  PARTICIPATION LIMITATIONS: cleaning, laundry, shopping, community activity, and church  PERSONAL FACTORS: Age, Behavior pattern, Education, Fitness, Past/current experiences, Social background, and Time since onset of injury/illness/exacerbation are also affecting patient's functional outcome.   REHAB POTENTIAL: Good  CLINICAL DECISION MAKING: Stable/uncomplicated  EVALUATION COMPLEXITY: Low  PLAN:  PT FREQUENCY: 2x/week  PT DURATION: 8 weeks  PLANNED  INTERVENTIONS: Therapeutic exercises, Therapeutic activity, Neuromuscular re-education, Gait training, Self Care,  and Re-evaluation  PLAN FOR NEXT SESSION: 10th visit progress note. monitor blood glucose - did she get her monitor fixed? monitor reports of vertigo and re-examine PRN.  Otherwise focus on functional strength and balance, functional activity tolerance.  modified SLS activities, dynamic/static balance, check in on patient's energy levels, LE and hemibody stretching (RUE limited), core engagement for BP support if contributing to dizziness/lightheadedness, ball toss on tilt board/airex, visual scanning tasks or dual tasking, SciFit warmup - requested by patient and daughter, turning tasks, pathfinding tasks/dynamic cone search or dual tasking with ambulation  Josephine Igo, PT, DPT [facility] [address] [place], [place]  [address] Phone:  [phone] Fax:  [phone]

## 2022-10-23 ENCOUNTER — Ambulatory Visit (HOSPITAL_COMMUNITY)
Admission: RE | Admit: 2022-10-23 | Discharge: 2022-10-23 | Disposition: A | Discharge status: Home / Self Care | Payer: Medicare HMO | Source: Ambulatory Visit | Attending: Gastroenterology | Admitting: Gastroenterology

## 2022-10-23 DIAGNOSIS — R634 Abnormal weight loss: Secondary | ICD-10-CM | POA: Insufficient documentation

## 2022-10-23 DIAGNOSIS — K5900 Constipation, unspecified: Secondary | ICD-10-CM | POA: Insufficient documentation

## 2022-10-23 DIAGNOSIS — R140 Abdominal distension (gaseous): Secondary | ICD-10-CM | POA: Insufficient documentation

## 2022-10-23 DIAGNOSIS — R1084 Generalized abdominal pain: Secondary | ICD-10-CM | POA: Diagnosis not present

## 2022-10-23 MED ORDER — SODIUM CHLORIDE (PF) 0.9 % IJ SOLN
INTRAMUSCULAR | Status: AC
  Filled 2022-10-23: qty 50

## 2022-10-23 MED ORDER — IOHEXOL 9 MG/ML PO SOLN
ORAL | Status: AC
  Filled 2022-10-23: qty 1000

## 2022-10-23 MED ORDER — IOHEXOL 300 MG/ML  SOLN
100.0000 mL | Freq: Once | INTRAMUSCULAR | Status: AC | PRN
  Administered 2022-10-23: 100 mL via INTRAVENOUS

## 2022-10-23 MED ORDER — IOHEXOL 9 MG/ML PO SOLN
1000.0000 mL | ORAL | Status: AC
  Administered 2022-10-23: 1000 mL via ORAL

## 2022-10-27 ENCOUNTER — Ambulatory Visit: Payer: Medicare HMO | Admitting: Physical Therapy

## 2022-10-27 ENCOUNTER — Encounter: Payer: Self-pay | Admitting: Physical Therapy

## 2022-10-27 NOTE — Therapy (Signed)
[facility] [address] [place], [place], [address] Phone: [phone]   Fax:  [phone]  Patient Details  Name: Shelly Blevins MRN: [id number] Date of Birth: 1945/08/10 Referring Provider:  No ref. provider found  Encounter Date: 10/27/2022  Called and spoke to pt regarding no-show to today's scheduled PT appointment. Pt reports she woke up with a headache and is not feeling well, forgot to call and cancel her appointment. Informed pt of next appointment date and time and informed pt to call and cancel beforehand if she is not feeling well. Pt verbalized understanding.    Jill Alexanders Asianae Minkler, PT, DPT 10/27/2022, 9:55 AM  [facility] [address] [place], [place], [address] Phone: [phone]   Fax:  [phone]

## 2022-10-29 ENCOUNTER — Ambulatory Visit: Payer: Medicare HMO | Attending: Internal Medicine | Admitting: Physical Therapy

## 2022-10-29 VITALS — BP 118/54 | HR 67

## 2022-10-29 DIAGNOSIS — R2681 Unsteadiness on feet: Secondary | ICD-10-CM | POA: Insufficient documentation

## 2022-10-29 DIAGNOSIS — R2689 Other abnormalities of gait and mobility: Secondary | ICD-10-CM | POA: Diagnosis not present

## 2022-10-29 DIAGNOSIS — M6281 Muscle weakness (generalized): Secondary | ICD-10-CM | POA: Insufficient documentation

## 2022-10-29 NOTE — Telephone Encounter (Signed)
I spoke to Shelly Blevins today she has been using the Miralax and doing well. She was happy with the samples of the Linzess, but she will stick with the Miralax for now as its working.

## 2022-10-29 NOTE — Therapy (Signed)
OUTPATIENT PHYSICAL THERAPY NEURO TREATMENT- RECERTIFICATION, DISCHARGE SUMMARY AND 10TH VISIT PROGRESS NOTE   Patient Name: Shelly Blevins MRN: [id number] DOB:03-20-1946, 77 y.o., female Today's Date: 10/29/2022   PCP: Benard Rink  REFERRING PROVIDER: Lanae Boast, MD  Physical Therapy Progress Note   Dates of Reporting Period: 09/02/22 - 10/29/22  See Note below for Objective Data and Assessment of Progress/Goals.  PHYSICAL THERAPY DISCHARGE SUMMARY  Visits from Start of Care: 10  Current functional level related to goals / functional outcomes: Mod I-S* w/ADLs due to impaired cognition and shuffling gait    Remaining deficits: Impaired safety awareness, moderate fall risk    Education / Equipment: HEP, importance of walking program    Patient agrees to discharge. Patient goals were partially met. Patient is being discharged due to being pleased with the current functional level.   END OF SESSION:  PT End of Session - 10/29/22 0938     Visit Number 10    Number of Visits 17    Date for PT Re-Evaluation 11/28/22   Recert   Authorization Type [employer] MCR    Authorization Time Period 09/02/22 to 10/28/22    Progress Note Due on Visit 10    PT Start Time 0932    PT Stop Time 1015    PT Time Calculation (min) 43 min    Equipment Utilized During Treatment Gait belt    Activity Tolerance Patient tolerated treatment well    Behavior During Therapy WFL for tasks assessed/performed              Past Medical History:  Diagnosis Date   Allergy    Arthritis    CAD (coronary artery disease)    mild nonobstructive disease, 30% Cfx lesion   Cataract    Depression    Diabetes mellitus    insulin-dependent; Dr. Talmage Nap   Dyslipidemia    GERD (gastroesophageal reflux disease)    Hyperlipidemia    Hypertension    Hypothyroidism    Dr. Talmage Nap   Stroke [facility])    Past Surgical History:  Procedure Laterality Date   ABDOMINAL HYSTERECTOMY     CARDIAC CATHETERIZATION   02/17/2007   mild CAD with 50% prox L Cfx stenosis, normal LV function (Dr. Claudia Desanctis)   CATARACT EXTRACTION Left    CHOLECYSTECTOMY     NM MYOCAR PERF WALL MOTION  2009   lexiscan myoview - perfusion defect in anterior myociaral region (breast attenuation) - low risk scan - EF 69%   THYROIDECTOMY     TRANSTHORACIC ECHOCARDIOGRAM  2008   normal study    Patient Active Problem List   Diagnosis Date Noted   Loss of weight 10/13/2022   Medication management 09/03/2022   History of gout 09/03/2022   Fall 08/19/2022   Hypoglycemia associated with diabetes (HCC) 08/19/2022   Near syncope 08/19/2022   Elevated uric acid in blood 05/12/2021   Chronic gout without tophus 05/12/2021   High risk medication use 05/12/2021   Anemia 01/23/2021   Diabetic neuropathy (HCC) 01/15/2021   Long term (current) use of insulin (HCC) 01/15/2021   Diabetes mellitus (HCC) 12/31/2020   Hypertrophy of nail 12/31/2020   Pain in joints of both feet 12/31/2020   Abnormal ankle brachial index (ABI) 12/31/2020   Encounter for health maintenance examination in adult 04/25/2020   Medicare annual wellness visit, subsequent 04/25/2020   Memory change 04/25/2020   Post-menopausal 04/25/2020   Generalized abdominal pain 02/29/2020   Constipation 02/29/2020   Bloating  02/29/2020   Osteoarthritis of both knees 01/10/2020   Chronic pain of both knees 03/28/2019   Vitamin D deficiency 03/28/2019   Ataxia 11/02/2016   Estrogen deficiency 11/02/2016   Vaccine counseling 10/09/2015   Advanced directives, counseling/discussion 10/09/2015   Need for prophylactic vaccination against Streptococcus pneumoniae (pneumococcus) 10/09/2015   Hypothyroidism 04/17/2015   Essential hypertension 06/06/2013   Dyslipidemia 06/06/2013   Coronary artery disease due to lipid rich plaque 06/06/2013   GERD 10/29/2008    ONSET DATE: 08/19/22  REFERRING DIAG: R55 (ICD-10-CM) - Near syncope  THERAPY DIAG:  Unsteadiness on  feet  Other abnormalities of gait and mobility  Muscle weakness (generalized)  Rationale for Evaluation and Treatment: Rehabilitation  SUBJECTIVE:                                                                                                                                                                                             SUBJECTIVE STATEMENT: Pt entered clinic without AD. Relying on L wall to stabilize balance. States she is feeling better today. Has not been working on her HEP but did walk in the house.   Pt accompanied by:  Andres Labrum    PERTINENT HISTORY: OA, CAD, DM, HLD, HTN, hypothyroidism, CVA, hx cardiac cath, hx cholecystectomy  PAIN:  Are you having pain? No  PRECAUTIONS: Fall  WEIGHT BEARING RESTRICTIONS: No  FALLS: Has patient fallen in last 6 months? Yes. Number of falls 2- fall that brought her to the ED, slipped off EOB to the floor on another occasion, no FOF   LIVING ENVIRONMENT: Lives with: lives with their family Lives in: House/apartment Stairs: ramped entrance, no steps inside home  Has following equipment at home: shower chair, Grab bars, and Ramped entry  PLOF: Independent, Independent with basic ADLs, Independent with gait, and Independent with transfers  PATIENT GOALS: be able to walk again "not a long ways but be able to walk about a block"   OBJECTIVE:   DIAGNOSTIC FINDINGS: Right hand wrist elbow and shoulder pain swelling x 1 wk [facility]en at [facility] -xray done, worried abt steroid use due to her Dm per family, xray and blood worse were done there- was told it is from Rheumatoid arthritis-she will need to follow-up with rheumatology outpatient Added scheduled Tylenol 1000 mg, ibuprofen as needed  PT OT evaluations - need rehab needs.  IMPRESSION: 1.  No evidence of an acute intracranial abnormality. 2. Mild chronic small vessel ischemic changes within the cerebral white matter. 3. Mild generalized parenchymal atrophy. 4. Bilateral  proptosis.   Left ankle pain and numbness- now mostly pain Hx of gout: on  allupurinol.  COGNITION: Overall cognitive status: Within functional limits for tasks assessed  VITALS Vitals:   10/29/22 0936  BP: (!) 118/54  Pulse: 67     Glucose at beginning of session: 150 mg/dL    TODAY'S TREATMENT:                                                                                                                              Ther Act  Assessed vitals (see above) and diastolic BP low but within limits for therapy. Pt denied lightheadedness, dizziness or fatigue.   LTG Assessment   [facility] PT Assessment - 10/29/22 0947       Standardized Balance Assessment   Standardized Balance Assessment Berg Balance Test      Berg Balance Test   Sit to Stand Able to stand without using hands and stabilize independently    Standing Unsupported Able to stand safely 2 minutes    Sitting with Back Unsupported but Feet Supported on Floor or Stool Able to sit safely and securely 2 minutes    Stand to Sit Sits safely with minimal use of hands    Transfers Able to transfer safely, minor use of hands    Standing Unsupported with Eyes Closed Able to stand 10 seconds safely    Standing Unsupported with Feet Together Able to place feet together independently and stand 1 minute safely    From Standing, Reach Forward with Outstretched Arm Can reach forward >12 cm safely (5")    From Standing Position, Pick up Object from Floor Able to pick up shoe safely and easily    From Standing Position, Turn to Look Behind Over each Shoulder Looks behind from both sides and weight shifts well    Turn 360 Degrees Able to turn 360 degrees safely in 4 seconds or less    Standing Unsupported, Alternately Place Feet on Step/Stool Able to stand independently and safely and complete 8 steps in 20 seconds    Standing Unsupported, One Foot in Front Able to plae foot ahead of the other independently and hold 30 seconds    Standing on  One Leg Tries to lift leg/unable to hold 3 seconds but remains standing independently    Total Score 51              Gait pattern: step through pattern, decreased arm swing- Right, decreased arm swing- Left, decreased stride length, shuffling, and narrow BOS Distance walked: 115' loop completed 2x + 98' = 328'  Assistive device utilized: None Level of assistance: SBA Comments: Pt able to maintain conversation w/therapist throughout assessment. No LOB noted but pt shuffling her feet, states her shoes are "sticky"        PATIENT EDUCATION: Education details: Goal outcomes, importance of walking program and movement for pain management  Person educated: Patient and Granddaughter  Education method: Explanation Education comprehension: verbalized understanding  HOME EXERCISE PROGRAM: You Can Walk For A Certain Length Of  Time Each Day                          Walk 5 minutes 1-2 times per day.             Increase 1-2  minutes every 7 days              Work up to 10 minutes (1-2 times per day).               Example:                         Day 1-2           4-5 minutes     3 times per day                         Day 7-8           10-12 minutes 2-3 times per day                         Day 13-14       20-22 minutes 1-2 times per day  Access Code: [email or web address] URL: [email or web address] Date: 09/10/2022 Prepared by: Maryruth Eve  Exercises - Sit to Stand with Arms Crossed  - 1 x daily - 4-5 x weekly - 2 sets - 10 reps - Side Stepping with Resistance at Thighs and Counter Support  - 1 x daily - 4 x weekly - 3-4 sets - 10 reps - Seated March with Resistance  - 1 x daily - 5 x weekly - 2 sets - 20 reps - Seated Hamstring Curls with Resistance  - 1 x daily - 5 x weekly - 2 sets - 10 reps - Corner Balance Feet Together: Eyes Closed With Head Turns  - 1 x daily - 7 x weekly - 3 sets - 10 reps - Tandem Stance  - 1 x daily - 7 x weekly - 3 sets - 10 reps  Provided  fall prevention handout 7/16.  GOALS: Goals reviewed with patient? Yes  SHORT TERM GOALS: Target date: 09/30/2022    Will be compliant with appropriate progressive HEP with MinA from family  Baseline:  Pt is intermittently compliant (7/16) Goal status: IN PROGRESS  2.  Subjective complaints of whole body stiffness to have improved by 50%  Baseline: Pt feels stiffness has not changed (7/16) Goal status: NOT MET  3.  Pt and family to be able to verbalize 3 ways to reduce fall risk at home and in the community  Baseline: Provided fall prevention handout w/ teachback of strategies (7/16) Goal status: IN PROGRESS  4.  Will be compliant with daily 10 minute walking program with family in order to improve general functional activity tolerance and complaints of fatigue  Baseline: 2-3 days of 10 minutes of continuous walking per report (7/16) Goal status: IN PROGRESS  LONG TERM GOALS: Target date: 10/28/2022   MMT to improve by at least 1 grade in all weak groups  Baseline:  Goal status: DC  2.  Will score at least 43 on Berg balance test in order to show reduced fall risk Baseline: 35 (6/5); 51 (8/1) Goal status: MET  3.  Will be able to ambulate at least 474ft in with LRAD in order to improve community access  Baseline: 240' no AD (6/11); 328' (8/1)  Goal status: NOT MET 4.  Will be compliant with appropriate advanced HEP with MinA from family in addition to regular advanced walking program (20-30 minutes at least 3x/week) in order to maintain functional gains from PT  Baseline:  Goal status: NOT MET    ASSESSMENT:  CLINICAL IMPRESSION: Emphasis of skilled PT session on LTG assessment and DC from PT. Pt has met 1 of 3 LTGs, improving her Berg from 35 to 51/56, indicative of moderate fall risk. Discontinued pt's MMT goal as it was performed by a different therapist, limiting the objective measure of goal. Pt did improve her distance on , but not to goal level. Pt reports  she has not been doing her HEP due to the "heat burning my skin" and various other ailments. Per granddaughter, pt is doing more at home than she tells therapist. Lengthy discussion regarding importance of walking for overall health and pain management and educated pt on why arthritis is more painful in the morning, emphasizing importance of movement. Pt verbalized understanding. Pt requesting to DC this date due to cost of services and being satisfied with current functional level. Recert to be sent today to cover final visit.   OBJECTIVE IMPAIRMENTS: Abnormal gait, decreased activity tolerance, decreased balance, decreased cognition, decreased coordination, decreased mobility, difficulty walking, decreased ROM, decreased strength, hypomobility, impaired perceived functional ability, and pain.   ACTIVITY LIMITATIONS: standing, squatting, transfers, bed mobility, and locomotion level  PARTICIPATION LIMITATIONS: cleaning, laundry, shopping, community activity, and church  PERSONAL FACTORS: Age, Behavior pattern, Education, Fitness, Past/current experiences, Social background, and Time since onset of injury/illness/exacerbation are also affecting patient's functional outcome.   REHAB POTENTIAL: Good  CLINICAL DECISION MAKING: Stable/uncomplicated  EVALUATION COMPLEXITY: Low  PLAN:  PT FREQUENCY: 2x/week  PT DURATION: 8 weeks  PLANNED INTERVENTIONS: Therapeutic exercises, Therapeutic activity, Neuromuscular re-education, Gait training, Self Care,  and Re-evaluation   Jill Alexanders Addalyne Vandehei, PT, DPT [facility] [address] [place], [place]  [address] Phone:  [phone] Fax:  [phone]

## 2022-11-03 ENCOUNTER — Ambulatory Visit: Payer: Medicare HMO | Admitting: Physical Therapy

## 2022-11-04 DIAGNOSIS — F4321 Adjustment disorder with depressed mood: Secondary | ICD-10-CM | POA: Diagnosis not present

## 2022-11-04 DIAGNOSIS — F331 Major depressive disorder, recurrent, moderate: Secondary | ICD-10-CM | POA: Diagnosis not present

## 2022-11-05 ENCOUNTER — Ambulatory Visit: Payer: Medicare HMO | Admitting: Physical Therapy

## 2022-11-09 ENCOUNTER — Other Ambulatory Visit: Payer: Self-pay | Admitting: Medical

## 2022-11-09 ENCOUNTER — Encounter: Payer: Medicare HMO | Admitting: Medical

## 2022-11-10 ENCOUNTER — Ambulatory Visit (INDEPENDENT_AMBULATORY_CARE_PROVIDER_SITE_OTHER): Payer: Medicare HMO | Admitting: Medical

## 2022-11-10 VITALS — BP 112/74 | HR 73 | Wt 172.0 lb

## 2022-11-10 DIAGNOSIS — I10 Essential (primary) hypertension: Secondary | ICD-10-CM

## 2022-11-10 DIAGNOSIS — Z794 Long term (current) use of insulin: Secondary | ICD-10-CM | POA: Diagnosis not present

## 2022-11-10 DIAGNOSIS — E559 Vitamin D deficiency, unspecified: Secondary | ICD-10-CM

## 2022-11-10 DIAGNOSIS — E785 Hyperlipidemia, unspecified: Secondary | ICD-10-CM | POA: Diagnosis not present

## 2022-11-10 DIAGNOSIS — E11649 Type 2 diabetes mellitus with hypoglycemia without coma: Secondary | ICD-10-CM

## 2022-11-10 DIAGNOSIS — K219 Gastro-esophageal reflux disease without esophagitis: Secondary | ICD-10-CM | POA: Diagnosis not present

## 2022-11-10 DIAGNOSIS — E1169 Type 2 diabetes mellitus with other specified complication: Secondary | ICD-10-CM | POA: Diagnosis not present

## 2022-11-10 DIAGNOSIS — E039 Hypothyroidism, unspecified: Secondary | ICD-10-CM

## 2022-11-10 DIAGNOSIS — M170 Bilateral primary osteoarthritis of knee: Secondary | ICD-10-CM

## 2022-11-10 DIAGNOSIS — Z79899 Other long term (current) drug therapy: Secondary | ICD-10-CM | POA: Diagnosis not present

## 2022-11-10 DIAGNOSIS — E790 Hyperuricemia without signs of inflammatory arthritis and tophaceous disease: Secondary | ICD-10-CM

## 2022-11-10 DIAGNOSIS — Z8739 Personal history of other diseases of the musculoskeletal system and connective tissue: Secondary | ICD-10-CM

## 2022-11-10 LAB — HEMOGLOBIN A1C
Est. average glucose Bld gHb Est-mCnc: 174 mg/dL
Hgb A1c MFr Bld: 7.7 % — ABNORMAL HIGH (ref 4.8–5.6)

## 2022-11-10 LAB — TSH+FREE T4

## 2022-11-10 MED ORDER — AMLODIPINE BESYLATE-VALSARTAN 10-160 MG PO TABS: 1.0000 | ORAL_TABLET | Freq: Every day | ORAL | 3 refills | Status: DC

## 2022-11-10 MED ORDER — DULOXETINE HCL 60 MG PO CPEP: 60.0000 mg | ORAL_CAPSULE | Freq: Every day | ORAL | 1 refills | Status: DC

## 2022-11-10 NOTE — Addendum Note (Signed)
Addended by: Jac Canavan on: 11/10/2022 04:44 PM   Modules accepted: Orders

## 2022-11-10 NOTE — Progress Notes (Signed)
Subjective:  Shelly Blevins is a 77 y.o. female who presents for Chief Complaint  Patient presents with   Medical Management of Chronic Issues    Med check- having issues sleeping at night- does any of her medications cause this     Here for recheck.   Here with her daughter Shelly Blevins.     Primary Care Provider [place]oua Schreiner, Kermit Balo, PA-C here for primary care  Current Health Care Team: Dentist, none, has full dentures Eye doctor, Dr. Dr. Elmer Picker, Dr. Alben Spittle Dr. Dorisann Frames, Dennie Maizes PA, endocrinology Dr. Zoila Shutter, cardiology Dr. Yancey Flemings, GI Dr. Haydee Salter, rheumatology Doreatha Massed, [place] and Dr. Coral Else, vascular surgery [facility] Dr. Naomie Dean and Butch Penny, NP, neurology   Concerns: Here for chronic issue follow-up and med check.  At her June visit she was unhappy about being on so many pills and wanted some help on figuring out ways to get off the medication.  After that visit we made referrals to pharmacy and to a different endocrinologist that she was unhappy with her current endocrinology office.  She felt like she was overdosed with insulin causing hospitalization for hypoglycemia.  She has been in talks with the pharmacist but still is not seeing a new endocrinologist despite referral being placed.  She notes having trouble sleeping. Takes a while to get to sleep and doesn't sleep for many hourse.   Has had trouble sleeping for month.  She denies alcohol use or heavy caffeine use.  She denies using any sleep aids.  Her daughter says she does snore and is interested in a sleep evaluation.  Diabetes-compliant with NovoLog 70/30, 5 units twice daily, Jardiance and metformin.  She uses aspart insulin sliding scale if sugars are above 150 for meals but does not have to use this very often.  She has a glucometer with her today.  Her 7, 14 and 30-day readings are all around 125-130 average glucose with no hypoglycemic.  She is compliant with  her thyroid medicine without complaint  She cannot recall the last time she had a gout flare but she does get some pains in her right shoulder and feet in general.  She is compliant with allopurinol.  She is compliant with her blood pressure medicine, spironolactone and amlodipine/valsartan and without complaint  We discussed her sleep and mood.  Her daughter feels like she has been more happy and less down since being on Cymbalta.  No other aggravating or relieving factors.    No other c/o.  Past Medical History:  Diagnosis Date   Allergy    Arthritis    CAD (coronary artery disease)    mild nonobstructive disease, 30% Cfx lesion   Cataract    Depression    Diabetes mellitus    insulin-dependent; Dr. Talmage Nap   Dyslipidemia    GERD (gastroesophageal reflux disease)    Hyperlipidemia    Hypertension    Hypothyroidism    Dr. Talmage Nap   Stroke [facility])    Current Outpatient Medications on File Prior to Visit  Medication Sig Dispense Refill   albuterol (VENTOLIN HFA) 108 (90 Base) MCG/ACT inhaler Inhale 2 puffs into the lungs every 6 (six) hours as needed for wheezing or shortness of breath.     allopurinol (ZYLOPRIM) 100 MG tablet Take 1 tablet (100 mg total) by mouth daily. 90 tablet 1   aspirin EC 81 MG tablet Take 1 tablet (81 mg total) by mouth daily. 90 tablet 3   atorvastatin (  LIPITOR) 80 MG tablet Take 1 tablet (80 mg total) by mouth every evening. 90 tablet 1   Continuous Glucose Receiver (FREESTYLE LIBRE 3 READER) DEVI 1 Device by Does not apply route daily. 1 each 0   Continuous Glucose Sensor (FREESTYLE LIBRE 3 SENSOR) MISC PLACE 1 SENSOR ON THE SKIN Q14 DAYS TO CHECK BLOOD GLUCOSE CONTINUOUSLY 2 each 2   donepezil (ARICEPT) 10 MG tablet TAKE 1 TABLET (10 MG TOTAL) BY MOUTH AT BEDTIME. HELPS SLOW DOWN MEMORY LOSS. 90 tablet 3   famotidine (PEPCID) 40 MG tablet Take 40 mg by mouth 2 (two) times daily.     Glucagon (GVOKE HYPOPEN 2-PACK) 1 MG/0.2ML SOAJ Inject 1 each into the  skin as needed. 0.2 mL 1   insulin aspart (NOVOLOG) 100 UNIT/ML FlexPen Inject 0-6 Units into the skin 3 (three) times daily with meals. Use Sliding Scale per instructions provided. 15 mL 11   JARDIANCE 10 MG TABS tablet Take 10 mg by mouth daily.     latanoprost (XALATAN) 0.005 % ophthalmic solution Place 1 drop into both eyes at bedtime.  5   levothyroxine (SYNTHROID) 50 MCG tablet Take 1 tablet (50 mcg total) by mouth daily. 90 tablet 1   linaclotide (LINZESS) 72 MCG capsule Take 1 capsule (72 mcg total) by mouth daily before breakfast. 30 capsule 3   metFORMIN (GLUCOPHAGE) 500 MG tablet Take 1,000 mg by mouth 2 (two) times daily.     Multiple Vitamins-Minerals (PRESERVISION AREDS 2 PO) Take 2 capsules by mouth daily.     NOVOLOG MIX 70/30 FLEXPEN (70-30) 100 UNIT/ML FlexPen Inject 5 Units into the skin 2 (two) times daily with a meal. 60 units in the morning with breakfast, 10 units with lunch, 60 units in the evening with dinner. (Patient taking differently: Inject 5 Units into the skin 2 (two) times daily with a meal.) 15 mL 3   spironolactone (ALDACTONE) 25 MG tablet Take 0.5 tablets (12.5 mg total) by mouth daily. 45 tablet 1   acetaminophen (TYLENOL) 500 MG tablet Take 1,000 mg by mouth every 6 (six) hours as needed for mild pain or moderate pain.     Alcohol Swabs (GLOBAL ALCOHOL PREP EASE) 70 % PADS USE AS DIRECTED TO test to check blood sugar AND FOR insulin USE 200 each 1   BD PEN NEEDLE NANO 2ND GEN 32G X 4 MM MISC 2 (two) times daily. as directed     ezetimibe (ZETIA) 10 MG tablet Take 1 tablet (10 mg total) by mouth daily. PATIENT MUST SCHEDULE ANNUAL APPOINTMENT FOR FUTURE REFILLS FIRST ATTEMPT (Patient not taking: Reported on 11/10/2022) 30 tablet 0   No current facility-administered medications on file prior to visit.     The following portions of the patient's history were reviewed and updated as appropriate: allergies, current medications, past family history, past medical  history, past social history, past surgical history and problem list.  ROS Otherwise as in subjective above    Objective: BP 112/74   Pulse 73   Wt 172 lb (78 kg)   LMP  (LMP Unknown)   BMI 30.96 kg/m   Wt Readings from Last 3 Encounters:  11/10/22 172 lb (78 kg)  10/13/22 174 lb 8 oz (79.2 kg)  09/23/22 176 lb 6.4 oz (80 kg)   BP Readings from Last 3 Encounters:  11/10/22 112/74  10/29/22 (!) 118/54  10/22/22 (!) 140/51   General appearance: alert, no distress, well developed, well nourished Heart: RRR, normal S1, S2, no  murmurs Lungs: CTA bilaterally, no wheezes, rhonchi, or rales Pulses: 2+ radial pulses, 2+ pedal pulses, normal cap refill Ext: no edema Neuro: CN II through XII intact, nonfocal exam Psych:, Pleasant, answers question appropriately    Assessment: Encounter Diagnoses  Name Primary?   Type 2 diabetes mellitus with other specified complication, with long-term current use of insulin (HCC) Yes   Hypothyroidism, unspecified type       Plan Diabetes Home readings are good.  Updated hemoglobin A1c lab today. Continue NovoLog Mix 70/30, 5 units twice daily Continue Jardiance 10 mg daily in the morning Continue metformin 500 mg twice daily Continue NovoLog aspart mealtime insulin sliding scale as you have been doing Continue glucose monitoring We are checking into referral to new endocrinologist since you do not want to go back to Dr. Willeen Cass office  Hypertension Blood pressure is at goal Continue amlodipine valsartan 10/160 mg daily Continue spironolactone 25 mg, 1/2 tablet daily as you have been doing  Feet pain, joint pain I recommend follow-up with your foot doctor and/or your rheumatologist  Sleep concerns, snoring I do not necessarily think any of your medicines are causing problems with sleep I do recommend you do a follow-up with neurology to discuss this further and to possibly consider sleep study Consider sleepy time tea at night  and generally at night Consider seeing a counselor or psychologist to help with cognitive behavioral therapy  Hypothyroidism Updated labs today, continue levothyroxine 50 mcg daily  Elevated uric acid, history of gout This may be a medicine, allopurinol, to consider in the future if you want to reduce your medicine burden since she had not had any recent flares and your last uric acid was pretty good  History of depression Based on our discussion today it sounds like Cymbalta 60 mg is working okay.  We increased this back in June  Hyperlipidemia You are currently on aspirin daily and Lipitor to help reduce risk of heart disease or stroke As people age, sometimes we make a decision to decrease or stop these medicines as they can sometimes cause issues with aches and pains or memory issues. We always have to weigh the benefit versus risk.  This is a discussion to think about with the pharmacist and asked primary care as we move forward  Expect a phone call from the pharmacist to do a medication review visit    Alyzabeth was seen today for medical management of chronic issues.  Diagnoses and all orders for this visit:  Type 2 diabetes mellitus with other specified complication, with long-term current use of insulin (HCC) -     Hemoglobin A1c  Hypothyroidism, unspecified type -     TSH + free T4  Other orders -     amLODipine-valsartan (EXFORGE) 10-160 MG tablet; Take 1 tablet by mouth daily. -     DULoxetine (CYMBALTA) 60 MG capsule; Take 1 capsule (60 mg total) by mouth daily.    Follow up: Pending labs

## 2022-11-10 NOTE — Patient Instructions (Signed)
Diabetes Home readings are good.  Updated hemoglobin A1c lab today. Continue NovoLog Mix 70/30, 5 units twice daily Continue Jardiance 10 mg daily in the morning Continue metformin 500 mg twice daily Continue NovoLog aspart mealtime insulin sliding scale as you have been doing Continue glucose monitoring We are checking into referral to new endocrinologist since you do not want to go back to Dr. Willeen Cass office  Hypertension Blood pressure is at goal Continue amlodipine valsartan 10/160 mg daily Continue spironolactone 25 mg, 1/2 tablet daily as you have been doing  Feet pain, joint pain I recommend follow-up with your foot doctor and/or your rheumatologist  Sleep concerns, snoring I do not necessarily think any of your medicines are causing problems with sleep I do recommend you do a follow-up with neurology to discuss this further and to possibly consider sleep study Consider sleepy time tea at night and generally at night Consider seeing a counselor or psychologist to help with cognitive behavioral therapy  Hypothyroidism Updated labs today, continue levothyroxine 50 mcg daily  Elevated uric acid, history of gout This may be a medicine, allopurinol, to consider in the future if you want to reduce your medicine burden since she had not had any recent flares and your last uric acid was pretty good  History of depression Based on our discussion today it sounds like Cymbalta 60 mg is working okay.  We increased this back in June  Hyperlipidemia You are currently on aspirin daily and Lipitor to help reduce risk of heart disease or stroke As people age, sometimes we make a decision to decrease or stop these medicines as they can sometimes cause issues with aches and pains or memory issues. We always have to weigh the benefit versus risk.  This is a discussion to think about with the pharmacist and asked primary care as we move forward  Expect a phone call from the pharmacist to do a  medication review visit

## 2022-11-11 ENCOUNTER — Other Ambulatory Visit: Payer: Self-pay | Admitting: Medical

## 2022-11-11 DIAGNOSIS — G479 Sleep disorder, unspecified: Secondary | ICD-10-CM

## 2022-11-11 DIAGNOSIS — Z79899 Other long term (current) drug therapy: Secondary | ICD-10-CM | POA: Insufficient documentation

## 2022-11-11 MED ORDER — LEVOTHYROXINE SODIUM 75 MCG PO TABS: 75.0000 ug | ORAL_TABLET | Freq: Every day | ORAL | 2 refills | Status: DC

## 2022-11-11 NOTE — Progress Notes (Signed)
Hemoglobin A1c 7.7%.  Relatively stable.  Thyroid is undercorrected.  I increased your dose to 75 mcg.  Please change to the new levothyroxine 75 mcg instead of 50 mcg  Expect a phone call from the pharmacist.  I am placing referral to sleep specialist at neurology  Lets see you back in 3 months

## 2022-11-13 ENCOUNTER — Telehealth: Payer: Self-pay

## 2022-11-13 NOTE — Progress Notes (Signed)
   11/13/2022  Patient ID: Shelly Blevins, female   DOB: 1945/04/18, 77 y.o.   MRN: [id number]  Contacted patient's granddaughter, [name] [facility]), to discuss having an in-person visit for medication review.  She states that starting after 8/26 she could be available to come in with Shelly Blevins to see me anytime between 9am and 1pm.  Coordinating with [facility] to find a time that works in regard to space and scheduling and will follow-up with Joshanea to solidify an appointment.  Lenna Gilford, PharmD, DPLA

## 2022-11-18 DIAGNOSIS — F4321 Adjustment disorder with depressed mood: Secondary | ICD-10-CM | POA: Diagnosis not present

## 2022-11-18 DIAGNOSIS — F331 Major depressive disorder, recurrent, moderate: Secondary | ICD-10-CM | POA: Diagnosis not present

## 2022-11-19 ENCOUNTER — Telehealth: Payer: Self-pay

## 2022-11-19 NOTE — Progress Notes (Signed)
   11/19/2022  Patient ID: Shelly Blevins, female   DOB: [birth year], 77 y.o.   MRN: [id number]  Patient outreach to schedule in person appointment with patient and daughter to review medications.  Appointment scheduled for 9/5 at 11am at [facility].  Higher education careers adviser to coordinate time and space to see patient/daughter.   I asked patient to bring current medications, Libre reader, and home BP monitor for [place] to review.  I will call the day prior to remind of/verify appointment.  Lenna Gilford, PharmD, DPLA

## 2022-11-27 ENCOUNTER — Telehealth: Payer: Self-pay

## 2022-11-27 ENCOUNTER — Telehealth: Payer: Self-pay | Admitting: Medical

## 2022-11-27 MED ORDER — FREESTYLE LIBRE 3 PLUS SENSOR MISC: 1.0000 | 11 refills | Status: DC

## 2022-11-27 NOTE — Progress Notes (Signed)
   11/27/2022  Patient ID: Shelly Blevins, female   DOB: 1946-01-13, 77 y.o.   MRN: [id number]  Clinic routed request from PCP to assist with [facility] prescription.  Pharmacy notified patient that the prescription needs to be changed from [place] 3 to [place] 3 plus with directions of changing sensor every 15 days based on prescription coverage.  Order sent under [facility] stating order for CGM to [facility].  Contacted pharmacy, and they did receive the order; but they were actually able to fill one last supply of Libre 3 for the patient; so they cannot process the [employer] on insurance yet.  The pharmacy will contact me if there are any issues when it is time to fill this.  Lenna Gilford, PharmD, DPLA

## 2022-11-27 NOTE — Telephone Encounter (Signed)
Cheryl handling

## 2022-11-27 NOTE — Telephone Encounter (Signed)
My pharmacy called & left message stating Free Style libre 3 is no longer available & needs Rx for Free Style Libre plus sensor & directions need to say change every 15 days & quantity of #2

## 2022-12-02 ENCOUNTER — Telehealth: Payer: Self-pay

## 2022-12-02 NOTE — Progress Notes (Signed)
   12/02/2022  Patient ID: Shelly Blevins, female   DOB: 01/28/1946, 77 y.o.   MRN: [id number]  Outreach to confirm in-person appointment with [name] and her daughter [name] tomorrow at [employer] at 11am.  Reminded them to bring medications, BP and BG readings.  Lenna Gilford, PharmD, DPLA

## 2022-12-03 ENCOUNTER — Other Ambulatory Visit: Payer: Medicare HMO

## 2022-12-04 NOTE — Progress Notes (Signed)
12/04/2022 Name: Shelly Blevins MRN: [id number] DOB: 11/08/1945  Chief Complaint  Patient presents with   Medication Management   Shelly Blevins is a 77 y.o. year old female who was referred for medication management by their primary care provider, Tysinger, Kermit Balo, PA-C. They presented for a face to face visit today.   They were referred to the pharmacist by their PCP for assistance in managing complex medication management   Subjective:  Care Team: Primary Care Provider: Genia Blevins ; Next Scheduled Visit: 02/05/23  Medication Access/Adherence  Current Pharmacy:  [facility] - [place], [place] - [address] [place] [address] Phone: [phone] Fax: [phone]  -Patient reports affordability concerns with their medications: Yes  -Patient reports access/transportation concerns to their pharmacy: No  -Patient reports adherence concerns with their medications:  Yes   Diabetes: Current medications: Novolog flexpen 0-6 units TID (SSI), metformin XR 1000mg  BID -Using Freestyle Libre 3 for CGM, patient is within BG range >80% of the time, with daily averages around 150- no hypoglycemia in the past 2 weeks, 2 instances of hyperglycemia  -Order for [place] 3 sent to pharmacy, and patient has picked this up and has on hand when time to change sensors comes -Patient is not currently taking Jardiance due to affordability concerns- unable to get approved for PAP -She is also not taking Novolog mix 70/30 due to hypoglycemia concerns when taking -Patient and daughter requesting referral for different endocrinologist or management by PCP  Hypertension: Current medications: amlodipine-valsartan 10-160mg  daily, sprionolactone 12.5mg  daily -Patient does have automatic wrist BP monitor, but it needs batteries placed.  Daughter states she has automated upper-arm monitor that she uses to check Ms. Shelly Blevins's home BP with on occasion. -Patient  reports hypotensive s/sx including dizziness, lightheadedness. This occurs rarely and is related to postural changes. -Patient denies hypertensive symptoms including headache, chest pain, shortness of breath  Hyperlipidemia/ASCVD Risk Reduction Current lipid lowering medications: atorvastatin 80mg  daily Antiplatelet regimen: ASA 81mg  daily -Ezetimibe 10mg  daily on active medication list and in current medication packaging from pharmacy; but patient/daughter state this was stopped at hospital discharge in May, and she should no longer be taking  Medication Management: Current adherence strategy: patient receives medication in daily packaging each month from [facility] -Patient reports Good adherence to medications, and fill history supports this -Patient reports the following barriers to adherence: cost of Jardiance, does not have recently increased levothyroxine dose, and not taking Novolog Mix due to hypoglycemia   Objective: Lab Results  Component Value Date   HGBA1C 7.7 (H) 11/10/2022   Lab Results  Component Value Date   CREATININE 1.15 10/13/2022   BUN 27 (H) 10/13/2022   NA 135 10/13/2022   K 4.2 10/13/2022   CL 98 10/13/2022   CO2 27 10/13/2022   Lab Results  Component Value Date   CHOL 125 09/23/2022   HDL 45 09/23/2022   LDLCALC 53 09/23/2022   TRIG 157 (H) 09/23/2022   CHOLHDL 2.8 09/23/2022   Medications Reviewed Today     Reviewed by Shelly Blevins, RPH (Pharmacist) on 12/04/22 at 1314  Med List Status: <None>   Medication Order Taking? Sig Documenting Provider Last Dose Status Informant  acetaminophen (TYLENOL) 500 MG tablet [id number]  Take 1,000 mg by mouth every 6 (six) hours as needed for mild pain or moderate pain. [provider]  Active Self, Pharmacy Records  albuterol (VENTOLIN HFA) 108 (90 Base) MCG/ACT  inhaler [id number] Yes Inhale 2 puffs into the lungs every 6 (six) hours as needed for wheezing or shortness of breath. [provider] Taking Active Self, Pharmacy Records  Alcohol Swabs (GLOBAL ALCOHOL PREP EASE) 70 % PADS [id number] Yes USE AS DIRECTED TO test to check blood sugar AND FOR insulin USE Tysinger, Kermit Balo, PA-C Taking Active   allopurinol (ZYLOPRIM) 100 MG tablet [id number] Yes Take 1 tablet (100 mg total) by mouth daily. Tysinger, Kermit Balo, PA-C Taking Active   amLODipine-valsartan (EXFORGE) 10-160 MG tablet [id number] Yes Take 1 tablet by mouth daily. Tysinger, Kermit Balo, PA-C Taking Active   aspirin EC 81 MG tablet [id number] Yes Take 1 tablet (81 mg total) by mouth daily. Tysinger, Kermit Balo, PA-C Taking Active   atorvastatin (LIPITOR) 80 MG tablet [id number] Yes Take 1 tablet (80 mg total) by mouth every evening. Tysinger, Kermit Balo, PA-C Taking Active   BD PEN NEEDLE NANO 2ND GEN 32G X 4 MM MISC [id number] Yes 2 (two) times daily. as directed [provider] Taking Active Self, Pharmacy Records  Continuous Glucose Sensor (FREESTYLE LIBRE 3 PLUS SENSOR) MISC [id number] Yes 1 each by Does not apply route as directed. Change sensor every 15 days Jac Canavan, PA-C Taking Active   donepezil (ARICEPT) 10 MG tablet [id number] Yes TAKE 1 TABLET (10 MG TOTAL) BY MOUTH AT BEDTIME. HELPS SLOW DOWN MEMORY LOSS. Butch Penny, NP Taking Active   DULoxetine (CYMBALTA) 60 MG capsule [id number] Yes Take 1 capsule (60 mg total) by mouth daily. Tysinger, Kermit Balo, PA-C Taking Active   ezetimibe (ZETIA) 10 MG tablet [id number] No Take 1 tablet (10 mg total) by mouth daily. PATIENT MUST SCHEDULE ANNUAL APPOINTMENT FOR FUTURE REFILLS FIRST ATTEMPT  Patient not taking: Reported on 12/04/2022   Chrystie Nose, MD Not Taking Active   famotidine (PEPCID) 40 MG tablet [id number] Yes Take 40 mg by mouth 2 (two) times daily. [provider] Taking Active Self, Pharmacy Records  Glucagon (GVOKE HYPOPEN 2-PACK) 1 MG/0.2ML SOAJ [id number] Yes Inject 1 each into the skin as needed. Tysinger, Kermit Balo, PA-C Taking Active Self, Pharmacy  Records           Med Note Jola Schmidt   Fri Aug 21, 2022  8:43 AM)    insulin aspart (NOVOLOG) 100 UNIT/ML FlexPen [id number] Yes Inject 0-6 Units into the skin 3 (three) times daily with meals. Use Sliding Scale per instructions provided. Pennie Banter, DO Taking Active            Med Note Laural Benes, [facility] A   Thu Sep 03, 2022 11:29 AM)    JARDIANCE 10 MG TABS tablet [id number] No Take 10 mg by mouth daily.  Patient not taking: Reported on 12/04/2022   [provider] Not Taking Active Self, Pharmacy Records  latanoprost (XALATAN) 0.005 % ophthalmic solution [id number] Yes Place 1 drop into both eyes at bedtime. [provider] Taking Active Self, Pharmacy Records  levothyroxine (SYNTHROID) 75 MCG tablet [id number] Yes Take 1 tablet (75 mcg total) by mouth daily. Tysinger, Kermit Balo, PA-C Taking Active   linaclotide Karlene Einstein) 72 MCG capsule [id number] Yes Take 1 capsule (72 mcg total) by mouth daily before breakfast. Zehr, Princella Pellegrini, PA-C Taking Active            Med Note Littie Deeds, Aracelis Ulrey A   Thu Dec 03, 2022 11:31 AM) samples  metFORMIN (GLUCOPHAGE) 500 MG tablet [id number] Yes Take 1,000 mg by mouth 2 (two) times daily.  [provider] Taking Active Self, Pharmacy Records  Multiple Vitamins-Minerals (PRESERVISION AREDS 2 PO) [id number] No Take 2 capsules by mouth daily. [provider] Unknown Active   NOVOLOG MIX 70/30 FLEXPEN (70-30) 100 UNIT/ML FlexPen [id number] No Inject 5 Units into the skin 2 (two) times daily with a meal. 60 units in the morning with breakfast, 10 units with lunch, 60 units in the evening with dinner.  Patient not taking: Reported on 12/03/2022   Pennie Banter, DO Not Taking Active   spironolactone (ALDACTONE) 25 MG tablet [id number] Yes Take 0.5 tablets (12.5 mg total) by mouth daily. Tysinger, Kermit Balo, PA-C Taking Active            Assessment/Plan:   Diabetes: - Currently moderately controlled based on age and most recent  A1c - Recommend changing Jardiance to [place] 10mg , as patient would qualify for AZ&Me PAP for this medication - Patient could benefit from once daily long-acting insulin- this could decrease or even eliminate need for bolus insulin.  I recommend Evaristo Bury 10 units daily, as this is covered on her insurance; and per test claim, would cost $28/month - Continue CGM use and regular follow-up with providers - Due for follow-up A1c at 11/18 PCP visit   Hypertension: - Currently controlled - Continue current regimen and regular follow-up at this time   Hyperlipidemia/ASCVD Risk Reduction: - Currently controlled.  - Ezetimibe appears to have been on automatic refills at pharmacy, and they were not informed therapy had been stopped.  Once refills ran out, cardiology was contacted for a refill, which was approved and filled for patient. - Based on current LDL and total cholesterol, I recommend stopping ezetimibe therapy to decrease pill burden - Continue atorvastatin 80mg  and ASA 81mg  daily  Medication Management: - Currently strategy sufficient to maintain appropriate adherence to prescribed medication regimen - Contacted patient's pharmacy to ensure ezetimibe, Jardiance, and levothyroxine would not be included in next medication packaging.  They have the levothyroxine order on file, and this will be included.  Follow Up Plan: None at this time but can as needed, especially if PCP manages DM and would like closer f/u by clinical pharmacist  Shelly Blevins, PharmD, DPLA

## 2022-12-09 ENCOUNTER — Telehealth: Payer: Self-pay

## 2022-12-09 NOTE — Progress Notes (Signed)
   12/09/2022  Patient ID: Joneen Caraway, female   DOB: 01-18-1946, 77 y.o.   MRN: [id number]  S/O Telephone follow-up with patient and granddaughter to discuss diabetes and overall medication management  Diabetes Management Plan -PCP is agreeable to Novolog 70/30 Mix being replaced with long acting insulin to provide better BG control throughout the day and hopefully decrease/eliminate need for Novolog SSI.  Patient and granddaughter also agreeable to this plan. -PCP, patient, and granddaughter are also agreeable to changing Jardiance to [place] 10mg  daily, because patient can no longer afford Jardiance copay and would qualify for Farxiga PAP through AZ&Me -Patient has appointment scheduled with new endocrinologist but cannot be seen until March 2025  Medication Management/Access/Adherence -PCP agreeable with patient discontinuing ezetimibe based on current lipid control and desire for decreased pill burden  A/P  Diabetes Management Plan -Stop Novolog 70/30 mix -Start Tresiba 10 units at bedtime -Continue Novolog SSI:  CBG 70-150: 0 units  CBG 151-200: 1 unit  CBG 201-250: 2 units  CBG 251-300: 3 units  CBG 301-350: 4 units  CBG 351-400: 5 units  CBG>400: 6 units and call MD  -Continue metformin XR 1000mg  BID -Start Farxiga 10mg  daily once PAP approved and medication received from AZ&Me -Coordinating with medication assistance team to pursue PAP for Farxiga 10mg  through [employer]Me -Consulting with PCP on diabetes management until patient can be seen by endo in March  Medication Management/Access/Adherence -Pharmacy has been made aware to no longer refill ezetimibe 10mg , Jardiance 10mg , or levothyroxine -They will refill levothyroxine and place in her next pill pack  Follow-up:  Sending follow-up from today's appointment along with my direct number via MyChart message to patient/granddaughter.  Will follow progress of Farxiga PAP to keep patient/provider informed, and will  check in on DM regimen in 2-4 weeks  Lenna Gilford, PharmD, DPLA

## 2022-12-11 ENCOUNTER — Other Ambulatory Visit: Payer: Self-pay | Admitting: Medical

## 2022-12-11 DIAGNOSIS — I10 Essential (primary) hypertension: Secondary | ICD-10-CM

## 2022-12-11 MED ORDER — TRESIBA FLEXTOUCH 100 UNIT/ML ~~LOC~~ SOPN
10.0000 [IU] | PEN_INJECTOR | Freq: Every day | SUBCUTANEOUS | 2 refills | Status: DC
Start: 2022-12-11 — End: 2023-07-28

## 2022-12-15 ENCOUNTER — Telehealth: Payer: Self-pay

## 2022-12-15 NOTE — Telephone Encounter (Signed)
-----   Message from Lenna Gilford sent at 12/11/2022  8:53 AM EDT ----- Patient previously denied PAP for Jardiance (never rec'd denial letter from [employer] to submit), but she would qualify for AZ&Me PAP for Farxiga 10mg  daily.  Patient and provider agree to change Jardiance therapy to Farxiga to pursue PAP.  Would you all mind to initiate the application process, please?  Thank you!

## 2022-12-15 NOTE — Telephone Encounter (Signed)
PAP: PAP application for Farxiga, Publishing rights manager (AZ&Me)) has been mailed to pt's home address on file. Will fax provider portion of application to provider's office when pt's portion is received.   PLEASE BE ADVISED

## 2022-12-21 ENCOUNTER — Institutional Professional Consult (permissible substitution): Payer: Medicare HMO | Admitting: Neurology

## 2022-12-28 DIAGNOSIS — E559 Vitamin D deficiency, unspecified: Secondary | ICD-10-CM | POA: Diagnosis not present

## 2022-12-28 DIAGNOSIS — I10 Essential (primary) hypertension: Secondary | ICD-10-CM | POA: Diagnosis not present

## 2022-12-28 DIAGNOSIS — E1140 Type 2 diabetes mellitus with diabetic neuropathy, unspecified: Secondary | ICD-10-CM | POA: Diagnosis not present

## 2022-12-28 DIAGNOSIS — E1165 Type 2 diabetes mellitus with hyperglycemia: Secondary | ICD-10-CM | POA: Diagnosis not present

## 2022-12-28 DIAGNOSIS — E039 Hypothyroidism, unspecified: Secondary | ICD-10-CM | POA: Diagnosis not present

## 2022-12-28 DIAGNOSIS — N1831 Chronic kidney disease, stage 3a: Secondary | ICD-10-CM | POA: Diagnosis not present

## 2022-12-31 ENCOUNTER — Other Ambulatory Visit: Payer: Medicare HMO

## 2023-01-01 ENCOUNTER — Other Ambulatory Visit: Payer: Medicare HMO

## 2023-01-01 NOTE — Progress Notes (Signed)
   01/01/2023  Patient ID: Shelly Blevins, female   DOB: 1945-09-11, 77 y.o.   MRN: [id number]  Connected with patient's granddaughter via telephone with patient permission as she handles all of patient's medications.  States she turned in PAP for [place] via mail, following up with med assistance team to determine if that has been received yet.   States she is currently taking the following for diabetes: Metformin 500mg  XR 2 tabs BID Novolog SSI Denies use of Novolin Mix or Tresiba  Confirmed with [facility] that Evaristo Bury was picked up on 12/11/22.  Reports sugars are not exceeding 140 in the last month. Working with granddaughter to get office access to [employer] sugar readings for better monitoring. Will send mychart message with instructions.  Follow Up: 1 week   Sherrill Raring, PharmD Clinical Pharmacist [phone]

## 2023-01-06 NOTE — Telephone Encounter (Signed)
RECEIVED PT PAGES for FARXIGA(AZ&ME) AND HAVE FAXED PROVIDER PAGES TO PROVIDER DAVID TYSINGER OFFICE.   PLEASE BE ADVISED

## 2023-01-07 ENCOUNTER — Other Ambulatory Visit: Payer: Self-pay | Admitting: Internal Medicine

## 2023-01-11 ENCOUNTER — Other Ambulatory Visit: Payer: Self-pay | Admitting: Medical

## 2023-01-11 DIAGNOSIS — Z1231 Encounter for screening mammogram for malignant neoplasm of breast: Secondary | ICD-10-CM

## 2023-01-13 ENCOUNTER — Telehealth: Payer: Self-pay

## 2023-01-13 ENCOUNTER — Telehealth: Payer: Self-pay | Admitting: Family Medicine

## 2023-01-13 NOTE — Telephone Encounter (Signed)
Johnny with [facility] left message req for copy of pt medications to be faxed to [phone]

## 2023-01-13 NOTE — Progress Notes (Signed)
   01/13/2023  Patient ID: Shelly Blevins, female   DOB: 07-13-1945, 77 y.o.   MRN: [id number]  Attempted to contact patient to schedule appointment for medication management. Left HIPAA compliant message for patient to return my call at their convenience.

## 2023-01-18 NOTE — Telephone Encounter (Signed)
PAP: Application for Marcelline Deist has been submitted to PAP Companies: AZ&ME, via fax    PLEASE BE ADVISED

## 2023-01-21 ENCOUNTER — Ambulatory Visit
Admission: RE | Admit: 2023-01-21 | Discharge: 2023-01-21 | Disposition: A | Discharge status: Home / Self Care | Payer: Medicare HMO | Source: Ambulatory Visit | Attending: Medical | Admitting: Medical

## 2023-01-21 DIAGNOSIS — Z1231 Encounter for screening mammogram for malignant neoplasm of breast: Secondary | ICD-10-CM

## 2023-01-25 NOTE — Progress Notes (Signed)
Results sent through MyChart

## 2023-02-09 ENCOUNTER — Other Ambulatory Visit: Payer: Self-pay | Admitting: Medical

## 2023-02-09 DIAGNOSIS — M1A9XX0 Chronic gout, unspecified, without tophus (tophi): Secondary | ICD-10-CM

## 2023-02-15 ENCOUNTER — Ambulatory Visit (INDEPENDENT_AMBULATORY_CARE_PROVIDER_SITE_OTHER): Payer: Medicare HMO | Admitting: Medical

## 2023-02-15 VITALS — BP 110/62 | HR 59 | Wt 167.0 lb

## 2023-02-15 DIAGNOSIS — L989 Disorder of the skin and subcutaneous tissue, unspecified: Secondary | ICD-10-CM | POA: Diagnosis not present

## 2023-02-15 DIAGNOSIS — Z23 Encounter for immunization: Secondary | ICD-10-CM

## 2023-02-15 DIAGNOSIS — Z794 Long term (current) use of insulin: Secondary | ICD-10-CM

## 2023-02-15 DIAGNOSIS — E785 Hyperlipidemia, unspecified: Secondary | ICD-10-CM

## 2023-02-15 DIAGNOSIS — I10 Essential (primary) hypertension: Secondary | ICD-10-CM

## 2023-02-15 DIAGNOSIS — E039 Hypothyroidism, unspecified: Secondary | ICD-10-CM

## 2023-02-15 DIAGNOSIS — E1169 Type 2 diabetes mellitus with other specified complication: Secondary | ICD-10-CM

## 2023-02-15 DIAGNOSIS — Z79899 Other long term (current) drug therapy: Secondary | ICD-10-CM

## 2023-02-15 LAB — TSH+FREE T4
Free T4: 1.05 ng/dL (ref 0.82–1.77)
TSH: 0.291 u[IU]/mL — ABNORMAL LOW (ref 0.450–4.500)

## 2023-02-15 LAB — HEMOGLOBIN A1C
Est. average glucose Bld gHb Est-mCnc: 171 mg/dL
Hgb A1c MFr Bld: 7.6 % — ABNORMAL HIGH (ref 4.8–5.6)

## 2023-02-15 MED ORDER — TRETINOIN 0.05 % EX CREA
TOPICAL_CREAM | Freq: Every day | CUTANEOUS | 0 refills | Status: DC
Start: 2023-02-15 — End: 2023-07-27

## 2023-02-15 NOTE — Patient Instructions (Addendum)
Recommendations: You are currently using metformin XR 500mg , 2 tablets twice daily Change to metformin XR 500mg , 1 tablet twice daily For now continue Tresiba at 10 units daily You can use novolog meal time inulin only if seeing elevated numbers, but your recent 3 months worth of readings are at goal except some low readings We want to avoid low readings altogether Try to eat twice daily or 3 times daily Don't just eat once daily as you run into risk of low sugars Drink plenty of water daily Get your last Shingrix vaccine at the pharmacy

## 2023-02-15 NOTE — Progress Notes (Signed)
Subjective:  Shelly Blevins is a 77 y.o. female who presents for Chief Complaint  Patient presents with   Medical Management of Chronic Issues    Med check. No concerns     Here for recheck.   Here with her daughter [name].     Primary Care Provider Edge Mauger, Kermit Balo, PA-C here for primary care  Current Health Care Team: Dentist, none, has full dentures Eye doctor, Dr. Dr. Elmer Picker, Dr. Alben Spittle Dr. Dorisann Frames, Dennie Maizes PA, endocrinology Dr. Zoila Shutter, cardiology Dr. Yancey Flemings, GI Dr. Haydee Salter, rheumatology Doreatha Massed, [place] and Dr. Coral Else, vascular surgery [facility] Dr. Naomie Dean and Butch Penny, NP, neurology   Concerns: Here with daughter today.    Diabetes-lately she is typically only once a day.  Sometimes these twice a day.  She has had decreased appetite for quite a while now.  She has not only been using NovoLog as her sugars have been pretty good.  After reviewing her freestyle libre sensor she does have 7 low readings in the last 3 months but her average glucose is at goal for 3 months.  She does still take metformin, Farxiga and 10 units of [place] daily  She had a consult with the pharmacist recently and at my pharmacy is having medicines in the bubble pack prepared   Hypothyroidism-last visit we changed her dose as her labs were not at goal.  She is compliant with her medication.  She is compliant with her blood pressure medicine, spironolactone and amlodipine/valsartan and without complaint  Compliant with statin without complaint  No other aggravating or relieving factors.    No other c/o.  Past Medical History:  Diagnosis Date   Allergy    Arthritis    CAD (coronary artery disease)    mild nonobstructive disease, 30% Cfx lesion   Cataract    Depression    Diabetes mellitus    insulin-dependent; Dr. Talmage Nap   Dyslipidemia    GERD (gastroesophageal reflux disease)    Hyperlipidemia    Hypertension     Hypothyroidism    Dr. Talmage Nap   Stroke [facility])    Current Outpatient Medications on File Prior to Visit  Medication Sig Dispense Refill   albuterol (VENTOLIN HFA) 108 (90 Base) MCG/ACT inhaler Inhale 2 puffs into the lungs every 6 (six) hours as needed for wheezing or shortness of breath.     allopurinol (ZYLOPRIM) 100 MG tablet Take 1 tablet (100 mg total) by mouth daily. 90 tablet 1   amLODipine-valsartan (EXFORGE) 10-160 MG tablet Take 1 tablet by mouth daily. 90 tablet 3   aspirin EC 81 MG tablet Take 1 tablet (81 mg total) by mouth daily. 90 tablet 3   atorvastatin (LIPITOR) 80 MG tablet Take 1 tablet (80 mg total) by mouth every evening. 90 tablet 1   BD PEN NEEDLE NANO 2ND GEN 32G X 4 MM MISC 2 (two) times daily. as directed     Blood Glucose Monitoring Suppl (FORACARE PREMIUM V10) DEVI USE TO test blood sugars THREE TIMES DAILY     Continuous Glucose Sensor (FREESTYLE LIBRE 3 PLUS SENSOR) MISC 1 each by Does not apply route as directed. Change sensor every 15 days 2 each 11   dapagliflozin propanediol (FARXIGA) 10 MG TABS tablet Take 10 mg by mouth daily.     donepezil (ARICEPT) 10 MG tablet TAKE 1 TABLET (10 MG TOTAL) BY MOUTH AT BEDTIME. HELPS SLOW DOWN MEMORY LOSS. 90 tablet 3   DULoxetine (  CYMBALTA) 60 MG capsule Take 1 capsule (60 mg total) by mouth daily. 90 capsule 1   famotidine (PEPCID) 40 MG tablet Take 40 mg by mouth 2 (two) times daily.     Glucagon (GVOKE HYPOPEN 2-PACK) 1 MG/0.2ML SOAJ Inject 1 each into the skin as needed. 0.2 mL 1   insulin aspart (NOVOLOG) 100 UNIT/ML FlexPen Inject 0-6 Units into the skin 3 (three) times daily with meals. Use Sliding Scale per instructions provided. 15 mL 11   insulin degludec (TRESIBA FLEXTOUCH) 100 UNIT/ML FlexTouch Pen Inject 10 Units into the skin daily at 10 pm. 3 mL 2   latanoprost (XALATAN) 0.005 % ophthalmic solution Place 1 drop into both eyes at bedtime.  5   levothyroxine (SYNTHROID) 75 MCG tablet Take 1 tablet (75 mcg total) by  mouth daily. 30 tablet 2   linaclotide (LINZESS) 72 MCG capsule Take 1 capsule (72 mcg total) by mouth daily before breakfast. 30 capsule 3   metFORMIN (GLUCOPHAGE-XR) 500 MG 24 hr tablet Take 1,000 mg by mouth 2 (two) times daily with a meal.     Multiple Vitamins-Minerals (PRESERVISION AREDS 2 PO) Take 2 capsules by mouth daily.     spironolactone (ALDACTONE) 25 MG tablet Take 0.5 tablets (12.5 mg total) by mouth daily. 45 tablet 1   TRUEplus Lancets 33G MISC Apply 1 each topically 3 (three) times daily.     acetaminophen (TYLENOL) 500 MG tablet Take 1,000 mg by mouth every 6 (six) hours as needed for mild pain or moderate pain.     Alcohol Swabs (ALCOHOL PADS) 70 % PADS USE AS DIRECTED TO test to check blood sugar AND FOR insulin USE 200 each 1   FORACARE PREMIUM V10 TEST test strip 1 each by Other route in the morning, at noon, and at bedtime.     No current facility-administered medications on file prior to visit.     The following portions of the patient's history were reviewed and updated as appropriate: allergies, current medications, past family history, past medical history, past social history, past surgical history and problem list.  ROS Otherwise as in subjective above    Objective: BP 110/62   Pulse (!) 59   Wt 167 lb (75.8 kg)   LMP  (LMP Unknown)   BMI 30.06 kg/m   Wt Readings from Last 3 Encounters:  02/15/23 167 lb (75.8 kg)  11/10/22 172 lb (78 kg)  10/13/22 174 lb 8 oz (79.2 kg)   BP Readings from Last 3 Encounters:  02/15/23 110/62  11/10/22 112/74  10/29/22 (!) 118/54   General appearance: alert, no distress, well developed, well nourished Heart: RRR, normal S1, S2, no murmurs Lungs: CTA bilaterally, no wheezes, rhonchi, or rales Pulses: 2+ radial pulses, 2+ pedal pulses, normal cap refill Ext: no edema Psych:, Pleasant, answers question appropriately Skin: Right cheek with a 4 mm raised nonspecific lesion is somewhat thickened, left chin with a  small 3 mm papular lesion    Assessment: Encounter Diagnoses  Name Primary?   Hypothyroidism, unspecified type Yes   Type 2 diabetes mellitus with other specified complication, with long-term current use of insulin (HCC)    Need for influenza vaccination    Skin lesion    Polypharmacy    Essential hypertension    Dyslipidemia          Plan Counseled on the influenza virus vaccine.  Vaccine information sheet given.  Influenza vaccine given after consent obtained.  Vaccine counseling Get your last shingles  vaccine at the pharmacy   Diabetes You report that you are using metformin XR 500mg , 2 tablets twice daily, 1000mg  BID Change to metformin XR 500mg , 1 tablet twice daily For now continue Tresiba at 10 units daily You can use novolog meal time inulin only if seeing elevated numbers, but your recent 3 months worth of readings are at goal except some low readings We want to avoid low readings altogether Try to eat twice daily or 3 times daily Don't just eat once daily as you run into risk of low sugars Drink plenty of water daily   Hypertension Blood pressure is at goal Continue amlodipine valsartan 10/160 mg daily Continue spironolactone 25 mg, 1/2 tablet daily as you have been doing  Hypothyroidism Updated labs today, last visit we increased levothyroxine to 75 mcg a day.  Hyperlipidemia Continue on aspirin daily and Lipitor, atorvastatin 80 mg daily to help reduce risk of heart disease or stroke  Skin lesions-begin trial Retin-A.  If not resolved over the next 2 to 3 weeks to let me know   Delaynee was seen today for medical management of chronic issues.  Diagnoses and all orders for this visit:  Hypothyroidism, unspecified type -     TSH + free T4  Type 2 diabetes mellitus with other specified complication, with long-term current use of insulin (HCC) -     Hemoglobin A1c  Need for influenza vaccination -     Flu Vaccine Trivalent High Dose  (Fluad)  Skin lesion  Polypharmacy  Essential hypertension  Dyslipidemia  Other orders -     tretinoin (RETIN-A) 0.05 % cream; Apply topically at bedtime.    Follow up: Pending labs

## 2023-02-16 ENCOUNTER — Other Ambulatory Visit: Payer: Self-pay | Admitting: Medical

## 2023-02-16 MED ORDER — DAPAGLIFLOZIN PROPANEDIOL 10 MG PO TABS: 10.0000 mg | ORAL_TABLET | Freq: Every day | ORAL | 0 refills | Status: DC

## 2023-02-16 MED ORDER — LEVOTHYROXINE SODIUM 75 MCG PO TABS: 75.0000 ug | ORAL_TABLET | Freq: Every day | ORAL | 0 refills | Status: DC

## 2023-02-16 MED ORDER — METFORMIN HCL ER 500 MG PO TB24: 500.0000 mg | ORAL_TABLET | Freq: Two times a day (BID) | ORAL | 0 refills | Status: DC

## 2023-02-16 MED ORDER — GVOKE HYPOPEN 2-PACK 1 MG/0.2ML ~~LOC~~ SOAJ: 1.0000 | SUBCUTANEOUS | 1 refills | Status: AC | PRN

## 2023-02-16 NOTE — Progress Notes (Signed)
Shelly Blevins, please call her pharmacy [facility] to let them know we are changing the metformin from XR 500 mg 2 tablets twice a day to XR 500 mg 1 tablet twice daily  The thyroid medicine should be 75 mcg a day  She will continue Tresiba 10 units daily  All other medicines the same   Results:  Hemoglobin A1c 7.6%.  Not bad but not quite to goal either.  Thyroid a little off as well  Recommendations: As we discussed yesterday change your metformin to 1 tablet twice a day instead of 2 tablets twice a day  Hold off on NovoLog mealtime insulin unless your sugars are running high.  Try to eat at least twice a day.  Continue Tresiba at 10 units daily since we are lowering your metformin.  If your sugar readings are regularly running over 130 in the coming weeks then let me know and we will bump up the [place].  Continue the [place]  Make sure you are taking your levothyroxine thyroid medicine every day on empty stomach first thing in the morning.  Lets leave the thyroid dose at 75 mcg for now  Follow-up in 4 months

## 2023-02-18 NOTE — Telephone Encounter (Signed)
PAP: Patient assistance application for Marcelline Deist has been approved by PAP Companies: [employer] from 01/18/2023 to 03/29/2024. Medication should be delivered to PAP Delivery: Home For further shipping updates, please contact [employer] ([employer]) at [phone] Pt ID is: [id number]

## 2023-03-08 ENCOUNTER — Other Ambulatory Visit: Payer: Self-pay | Admitting: Medical

## 2023-03-08 DIAGNOSIS — M1A9XX0 Chronic gout, unspecified, without tophus (tophi): Secondary | ICD-10-CM

## 2023-03-17 DIAGNOSIS — H04123 Dry eye syndrome of bilateral lacrimal glands: Secondary | ICD-10-CM | POA: Diagnosis not present

## 2023-03-17 DIAGNOSIS — H401131 Primary open-angle glaucoma, bilateral, mild stage: Secondary | ICD-10-CM | POA: Diagnosis not present

## 2023-03-17 DIAGNOSIS — E119 Type 2 diabetes mellitus without complications: Secondary | ICD-10-CM | POA: Diagnosis not present

## 2023-03-17 DIAGNOSIS — H353111 Nonexudative age-related macular degeneration, right eye, early dry stage: Secondary | ICD-10-CM | POA: Diagnosis not present

## 2023-03-17 LAB — HM DIABETES EYE EXAM

## 2023-03-18 ENCOUNTER — Encounter: Payer: Self-pay | Admitting: Medical

## 2023-03-18 ENCOUNTER — Ambulatory Visit: Payer: Medicare HMO

## 2023-03-18 NOTE — Progress Notes (Deleted)
   03/18/2023  Patient ID: Shelly Blevins, female   DOB: 09-Feb-1946, 77 y.o.   MRN: [id number]  Attempted to contact patient's granddaughter for scheduled in office appt appointment for medication management as she had stated she would be the one bringing patient to the appt. Left HIPAA compliant message for patient/granddaughter to return my call at their convenience.   Sherrill Raring, PharmD Clinical Pharmacist [phone]

## 2023-04-11 NOTE — Progress Notes (Signed)
 PATIENT: Shelly Blevins DOB: Jan 15, 1946  REASON FOR VISIT: follow up HISTORY FROM: patient PRIMARY NEUROLOGIST: Dr. Ines  Chief Complaint  Patient presents with   Follow-up    Patient in room #17 and alone. Patient states she here to follow up on her memory issues.    HISTORY OF PRESENT ILLNESS: Today 04/11/23:  Shelly Blevins is a 78 y.o. female with a history of memory disturbance. Returns today for follow-up. Feels that some days her memory is good other days she notices some problems. She lives with her grandson and sister. Able to complete all ADLs independently. No longer operating a motor vehicle. Granddaughter helps with her meds, appointments and finances. No change in mood or behavior. Reports that she is having vivid dreams. Sometimes she can't tell if she is dreaming or really see those things. Happens at night, never has this during the day. Remains on Aricept  10 mg at bedtime.    09/07/22: Shelly Blevins is a 78 y.o. female with a history of memory disturbance. Returns today for follow-up.  At the last visit we increased Aricept  to 10 mg at bedtime.  She reports that she is tolerating this well.  She continues to live with her sister and grandson.  She is able to complete all ADLs independently.  Reports that she is sleeping well.  Her granddaughter manages her finances and appointments.  She states that she does not sleep well because her sister keeps her up talking.   02/23/22: Shelly Blevins is a 78 y.o. female who has been followed in this office for memory disturbance. Returns today for follow-up. Feels that memory is better. Lives with her grandson and sister. Able to complete all ADLs independently. Needs help with appointments. Manages her own medications but notices that she may forget. Granddaughter helps remind her about medication. Sleeps well. Reports that she may get agitated easy.  Currently taking Aricept  5 mg at bedtime and tolerating it  well.   HISTORY Shelly Blevins is a 78 y.o. female here as requested by Bulah Alm RAMAN, PA-C for memory. PMHx chronic gout, diabetic neuropathy, hypercholesterolemia, long-term use of insulin , decreased pulse(reviewed pulses over the last 2 years and have been normal), memory changes, hypothyroidism, coronary artery disease, memory changes.   She says she has had slowly progressive memory problems for years. Losing keys, losing her pocket book,she can put something up in a cabinet and forget where she puts it at. She lives with her grandchildren, and great grand kids and she helps with caretaking and that can be stressful one has autism. No problems with driving or getting lost. She sometimes feel she repeats things, like her appointments her granddaughter helps her keep up with them. She keeps a calendar. Sometimes she may miss medications but her daughter reminds her. Patient puts her pills in a pill box but sometimes she makes mistakes. Daughter writes out the bills for her, sometimes she can write a check and feels anxious, she reports anxiety and depression. She has a lot of anxiety and depression; Stress at home with raising great grandchildren. She is sad some days. Her daughter died this week and that is making things worse. I recommend seeing psychiatry/therapy as guided by Alm bulah. Some days I could dig a hole and get in. She has a sister who has been diagnosed with dementia, she is 2 years older and has forgotten her age unknown if Alzheimer's. She feels worse since she retired from [employer]  a few years ago. She has episodes of confusion, possible altered mental status. No other focal neurologic deficits, associated symptoms, inciting events or modifiable factors.     Reviewed notes, labs and imaging from outside physicians, which showed:   05/28/2021: RPR NR, 04/28/2021 HIV NR, Hep c neg, b12 1081   MRI brain 01/21/2017: reviewed images and agree:  The brain parenchyma shows  minimum age-appropriate periventricular and subcortical white matter hyperintensities from chronic small vessel disease as well as mild degree of generalized cerebral atrophy. No structural lesion, tumor or infarcts are noted. Paranasal sinuses show mild chronic inflammatory changes.No abnormal lesions are seen on diffusion-weighted views to suggest acute ischemia. The cortical sulci, fissures and cisterns are normal in size and appearance. Lateral, third and fourth ventricle are normal in size and appearance. No extra-axial fluid collections are seen. No evidence of mass effect or midline shift.  No abnormal lesions are seen on post contrast views.  On sagittal views the posterior fossa, pituitary gland and corpus callosum are unremarkable. No evidence of intracranial hemorrhage on gradient-echo views. The orbits and their contents, paranasal sinuses and calvarium are unremarkable.  Intracranial flow voids are present. Thin sections through the cerebellopontine angles show normal appearance of the VIII nerve complexes without any abnormal enhancement or tumors noted  REVIEW OF SYSTEMS: Out of a complete 14 system review of symptoms, the patient complains only of the following symptoms, and all other reviewed systems are negative.  ALLERGIES: No Known Allergies  HOME MEDICATIONS: Outpatient Medications Prior to Visit  Medication Sig Dispense Refill   acetaminophen  (TYLENOL ) 500 MG tablet Take 1,000 mg by mouth every 6 (six) hours as needed for mild pain or moderate pain.     albuterol  (VENTOLIN  HFA) 108 (90 Base) MCG/ACT inhaler Inhale 2 puffs into the lungs every 6 (six) hours as needed for wheezing or shortness of breath.     Alcohol Swabs (ALCOHOL PADS) 70 % PADS USE AS DIRECTED TO test to check blood sugar AND FOR insulin  USE 200 each 1   allopurinol  (ZYLOPRIM ) 100 MG tablet TAKE 1 Tablet BY MOUTH ONCE DAILY (IN THE MORNING) 90 tablet 0   amLODipine -valsartan  (EXFORGE ) 10-160 MG tablet Take 1  tablet by mouth daily. 90 tablet 3   aspirin  EC 81 MG tablet Take 1 tablet (81 mg total) by mouth daily. 90 tablet 3   atorvastatin  (LIPITOR) 80 MG tablet Take 1 tablet (80 mg total) by mouth every evening. 90 tablet 1   BD PEN NEEDLE NANO 2ND GEN 32G X 4 MM MISC 2 (two) times daily. as directed     Blood Glucose Monitoring Suppl (FORACARE PREMIUM V10) DEVI USE TO test blood sugars THREE TIMES DAILY     Continuous Glucose Sensor (FREESTYLE LIBRE 3 PLUS SENSOR) MISC 1 each by Does not apply route as directed. Change sensor every 15 days 2 each 11   donepezil  (ARICEPT ) 10 MG tablet TAKE 1 TABLET (10 MG TOTAL) BY MOUTH AT BEDTIME. HELPS SLOW DOWN MEMORY LOSS. 90 tablet 3   DULoxetine  (CYMBALTA ) 60 MG capsule Take 1 capsule (60 mg total) by mouth daily. 90 capsule 1   famotidine  (PEPCID ) 40 MG tablet Take 40 mg by mouth 2 (two) times daily.     FORACARE PREMIUM V10 TEST test strip 1 each by Other route in the morning, at noon, and at bedtime.     Glucagon  (GVOKE HYPOPEN  2-PACK) 1 MG/0.2ML SOAJ Inject 1 each into the skin as needed. 0.2 mL 1  insulin  aspart (NOVOLOG ) 100 UNIT/ML FlexPen Inject 0-6 Units into the skin 3 (three) times daily with meals. Use Sliding Scale per instructions provided. 15 mL 11   insulin  degludec (TRESIBA  FLEXTOUCH) 100 UNIT/ML FlexTouch Pen Inject 10 Units into the skin daily at 10 pm. 3 mL 2   latanoprost  (XALATAN ) 0.005 % ophthalmic solution Place 1 drop into both eyes at bedtime.  5   levothyroxine  (SYNTHROID ) 75 MCG tablet Take 1 tablet (75 mcg total) by mouth daily. 90 tablet 0   linaclotide  (LINZESS ) 72 MCG capsule Take 1 capsule (72 mcg total) by mouth daily before breakfast. 30 capsule 3   metFORMIN  (GLUCOPHAGE -XR) 500 MG 24 hr tablet Take 1 tablet (500 mg total) by mouth 2 (two) times daily with a meal. 180 tablet 0   Multiple Vitamins-Minerals (PRESERVISION AREDS 2 PO) Take 2 capsules by mouth daily.     spironolactone  (ALDACTONE ) 25 MG tablet Take 0.5 tablets (12.5  mg total) by mouth daily. 45 tablet 1   tretinoin  (RETIN-A ) 0.05 % cream Apply topically at bedtime. 20 g 0   TRUEplus Lancets 33G MISC Apply 1 each topically 3 (three) times daily.     No facility-administered medications prior to visit.    PAST MEDICAL HISTORY: Past Medical History:  Diagnosis Date   Allergy    Arthritis    CAD (coronary artery disease)    mild nonobstructive disease, 30% Cfx lesion   Cataract    Depression    Diabetes mellitus    insulin -dependent; Dr. Tommas   Dyslipidemia    GERD (gastroesophageal reflux disease)    Hyperlipidemia    Hypertension    Hypothyroidism    Dr. Tommas   Stroke [facility])     PAST SURGICAL HISTORY: Past Surgical History:  Procedure Laterality Date   ABDOMINAL HYSTERECTOMY     CARDIAC CATHETERIZATION  02/17/2007   mild CAD with 50% prox L Cfx stenosis, normal LV function (Dr. HILARIO Jester)   CATARACT EXTRACTION Left    CHOLECYSTECTOMY     NM MYOCAR PERF WALL MOTION  2009   lexiscan  myoview  - perfusion defect in anterior myociaral region (breast attenuation) - low risk scan - EF 69%   THYROIDECTOMY     TRANSTHORACIC ECHOCARDIOGRAM  2008   normal study     FAMILY HISTORY: Family History  Problem Relation Age of Onset   Heart failure Mother    Diabetes Mother    Diabetes Father    Dementia Sister    Diabetes Sister    Heart failure Brother 56   Diabetes Brother    Diabetes Daughter    Hypertension Daughter    Hyperlipidemia Daughter    Diabetes Daughter    Bone cancer Daughter    Colon cancer Neg Hx    Esophageal cancer Neg Hx    Stomach cancer Neg Hx    Rectal cancer Neg Hx    Alzheimer's disease Neg Hx     SOCIAL HISTORY: Social History   Socioeconomic History   Marital status: Married    Spouse name: Not on file   Number of children: 2   Years of education: 10   Highest education level: Not on file  Occupational History    Employer: [employer]  Tobacco Use   Smoking status: Former    Current  packs/day: 0.00    Types: Cigarettes    Quit date: 04/02/1986    Years since quitting: 37.0   Smokeless tobacco: Never  Vaping Use   Vaping  status: Never Used  Substance and Sexual Activity   Alcohol use: No   Drug use: No   Sexual activity: Not on file  Other Topics Concern   Not on file  Social History Narrative   Not on file   Social Drivers of Health   Financial Resource Strain: Low Risk  (09/19/2020)   Overall Financial Resource Strain (CARDIA)    Difficulty of Paying Living Expenses: Not very hard  Food Insecurity: No Food Insecurity (08/19/2022)   Hunger Vital Sign    Worried About Running Out of Food in the Last Year: Never true    Ran Out of Food in the Last Year: Never true  Transportation Needs: No Transportation Needs (08/19/2022)   PRAPARE - Administrator, Civil Service (Medical): No    Lack of Transportation (Non-Medical): No  Physical Activity: Not on file  Stress: Not on file  Social Connections: Not on file  Intimate Partner Violence: Not At Risk (08/19/2022)   Humiliation, Afraid, Rape, and Kick questionnaire    Fear of Current or Ex-Partner: No    Emotionally Abused: No    Physically Abused: No    Sexually Abused: No      PHYSICAL EXAM  Vitals:   04/12/23 1037  BP: (!) 132/47  Pulse: (!) 51  Weight: 170 lb (77.1 kg)  Height: 5' 2 (1.575 m)    Body mass index is 31.09 kg/m.     04/12/2023   10:40 AM 09/07/2022    9:53 AM 02/23/2022    9:30 AM  MMSE - Mini Mental State Exam  Orientation to time 4 3 2   Orientation to Place 5 5 5   Registration 3 3 3   Attention/ Calculation 1 0 5  Recall 2 3 2   Language- name 2 objects 2 2 2   Language- repeat 1 1 0  Language- follow 3 step command 2 3 3   Language- read & follow direction 1 0 0  Write a sentence 1 0 1  Copy design 0 0 0  Total score 22 20 23      Generalized: Well developed, in no acute distress   Neurological examination  Mentation: Alert oriented to time, place, history  taking. Follows all commands speech and language fluent Cranial nerve II-XII: Pupils were equal round reactive to light. Extraocular movements were full, visual field were full on confrontational test. Facial sensation and strength were normal. Head turning and shoulder shrug  were normal and symmetric. Motor: The motor testing reveals 5 over 5 strength of all 4 extremities. Good symmetric motor tone is noted throughout.  Sensory: Sensory testing is intact to soft touch on all 4 extremities. No evidence of extinction is noted.  Coordination: Cerebellar testing reveals good finger-nose-finger and heel-to-shin bilaterally.  Gait and station: Gait is normal.    DIAGNOSTIC DATA (LABS, IMAGING, TESTING) - I reviewed patient records, labs, notes, testing and imaging myself where available.  Lab Results  Component Value Date   WBC 10.0 08/20/2022   HGB 10.4 (L) 08/20/2022   HCT 32.2 (L) 08/20/2022   MCV 88.7 08/20/2022   PLT 240 08/20/2022      Component Value Date/Time   NA 135 10/13/2022 1501   NA 137 11/06/2021 1604   K 4.2 10/13/2022 1501   CL 98 10/13/2022 1501   CO2 27 10/13/2022 1501   GLUCOSE 137 (H) 10/13/2022 1501   BUN 27 (H) 10/13/2022 1501   BUN 18 11/06/2021 1604   CREATININE 1.15 10/13/2022 1501  CREATININE 0.90 11/02/2016 0934   CALCIUM  9.6 10/13/2022 1501   PROT 7.2 08/20/2022 0627   PROT 7.7 11/06/2021 1604   ALBUMIN 3.4 (L) 08/20/2022 0627   ALBUMIN 4.7 11/06/2021 1604   AST 42 (H) 08/20/2022 0627   ALT 26 08/20/2022 0627   ALKPHOS 62 08/20/2022 0627   BILITOT 0.6 08/20/2022 0627   BILITOT 0.3 11/06/2021 1604   GFRNONAA 50 (L) 08/20/2022 0627   GFRAA 66 04/25/2020 1151   Lab Results  Component Value Date   CHOL 125 09/23/2022   HDL 45 09/23/2022   LDLCALC 53 09/23/2022   TRIG 157 (H) 09/23/2022   CHOLHDL 2.8 09/23/2022   Lab Results  Component Value Date   HGBA1C 7.6 (H) 02/15/2023   Lab Results  Component Value Date   VITAMINB12 1,081 04/28/2021    Lab Results  Component Value Date   TSH 0.291 (L) 02/15/2023      ASSESSMENT AND PLAN 78 y.o. year old female  has a past medical history of Allergy, Arthritis, CAD (coronary artery disease), Cataract, Depression, Diabetes mellitus, Dyslipidemia, GERD (gastroesophageal reflux disease), Hyperlipidemia, Hypertension, Hypothyroidism, and Stroke (HCC). here with :  1.  Memory disturbance  -Continue Aricept  to 10 mg daily- but change to the AM could help with vivid dreams. -MMSE 22/30 previously 20/30 - FU in 8 months or sooner if needed    Duwaine Russell, MSN, NP-C 04/11/2023, 8:25 AM [facility] [address] [place], [place] [address] [phone]

## 2023-04-12 ENCOUNTER — Encounter: Payer: Self-pay | Admitting: Adult Health

## 2023-04-12 ENCOUNTER — Ambulatory Visit: Payer: Medicare Other | Admitting: Adult Health

## 2023-04-12 VITALS — BP 132/47 | HR 51 | Ht 62.0 in | Wt 170.0 lb

## 2023-04-12 DIAGNOSIS — R413 Other amnesia: Secondary | ICD-10-CM

## 2023-04-12 NOTE — Patient Instructions (Signed)
 Your Plan:   -Continue Aricept  to 10 mg daily- but change to the AM  Thank you for coming to see us  at [facility]. I hope we have been able to provide you high quality care today.  You may receive a patient satisfaction survey over the next few weeks. We would appreciate your feedback and comments so that we may continue to improve ourselves and the health of our patients.

## 2023-04-19 DIAGNOSIS — H401131 Primary open-angle glaucoma, bilateral, mild stage: Secondary | ICD-10-CM | POA: Diagnosis not present

## 2023-04-29 DIAGNOSIS — E1165 Type 2 diabetes mellitus with hyperglycemia: Secondary | ICD-10-CM | POA: Diagnosis not present

## 2023-04-29 DIAGNOSIS — E559 Vitamin D deficiency, unspecified: Secondary | ICD-10-CM | POA: Diagnosis not present

## 2023-05-05 ENCOUNTER — Ambulatory Visit: Payer: Medicare HMO | Admitting: Medical

## 2023-05-06 DIAGNOSIS — Z794 Long term (current) use of insulin: Secondary | ICD-10-CM | POA: Diagnosis not present

## 2023-05-06 DIAGNOSIS — I10 Essential (primary) hypertension: Secondary | ICD-10-CM | POA: Diagnosis not present

## 2023-05-06 DIAGNOSIS — N1831 Chronic kidney disease, stage 3a: Secondary | ICD-10-CM | POA: Diagnosis not present

## 2023-05-06 DIAGNOSIS — E039 Hypothyroidism, unspecified: Secondary | ICD-10-CM | POA: Diagnosis not present

## 2023-05-06 DIAGNOSIS — E1165 Type 2 diabetes mellitus with hyperglycemia: Secondary | ICD-10-CM | POA: Diagnosis not present

## 2023-05-13 ENCOUNTER — Telehealth: Payer: Self-pay | Admitting: Family Medicine

## 2023-05-13 NOTE — Telephone Encounter (Signed)
Pt was notified to contact endocrinology if her blood sugars go up

## 2023-05-13 NOTE — Telephone Encounter (Signed)
Pt called and states she cannot take the [place], she can't sleep, can't eat and feels awful when she takes it.  Please advise pt [phone]

## 2023-06-02 ENCOUNTER — Encounter: Admitting: Medical

## 2023-06-04 ENCOUNTER — Telehealth: Payer: Self-pay

## 2023-06-04 NOTE — Telephone Encounter (Signed)
 Received letter from [employer] when her next refill will be mail left pt a HIPAA VM.

## 2023-06-07 ENCOUNTER — Other Ambulatory Visit: Payer: Self-pay | Admitting: Medical

## 2023-06-07 DIAGNOSIS — M1A9XX0 Chronic gout, unspecified, without tophus (tophi): Secondary | ICD-10-CM

## 2023-06-08 ENCOUNTER — Telehealth: Payer: Self-pay | Admitting: Internal Medicine

## 2023-06-08 DIAGNOSIS — E7800 Pure hypercholesterolemia, unspecified: Secondary | ICD-10-CM

## 2023-06-08 DIAGNOSIS — I10 Essential (primary) hypertension: Secondary | ICD-10-CM

## 2023-06-08 NOTE — Telephone Encounter (Signed)
 Copied from CRM [id number]. Topic: Clinical - Prescription Issue >> Jun 08, 2023 12:45 PM Shelah Lewandowsky wrote: Reason for CRM: Shelly Blevins with [facility], missing quite a few medications for her bubble packs and need them sent over- atorvastatin (LIPITOR) 80 MG tablet, spironolactone (ALDACTONE) 25 MG tablet, DULoxetine (CYMBALTA) 60 MG capsule- request was sent over-  [phone]

## 2023-06-09 MED ORDER — DULOXETINE HCL 60 MG PO CPEP: 60.0000 mg | ORAL_CAPSULE | Freq: Every day | ORAL | 1 refills | Status: DC

## 2023-06-09 MED ORDER — SPIRONOLACTONE 25 MG PO TABS: 12.5000 mg | ORAL_TABLET | Freq: Every day | ORAL | 1 refills | Status: DC

## 2023-06-09 MED ORDER — ATORVASTATIN CALCIUM 80 MG PO TABS: 80.0000 mg | ORAL_TABLET | Freq: Every evening | ORAL | 1 refills | Status: DC

## 2023-06-09 NOTE — Telephone Encounter (Signed)
Sent in requested meds.

## 2023-07-07 ENCOUNTER — Telehealth: Payer: Self-pay

## 2023-07-07 ENCOUNTER — Other Ambulatory Visit (HOSPITAL_COMMUNITY): Payer: Self-pay

## 2023-07-07 NOTE — Telephone Encounter (Signed)
 Pharmacy Patient Advocate Encounter   Received notification from CoverMyMeds that prior authorization for FreeStyle Libre 3 Plus Sensor is required/requested.   Insurance verification completed.   The patient is insured through [facility].   Per test claim: PA required; PA submitted to above mentioned insurance via CoverMyMeds Key/confirmation #/EOC (Key: [id number])  Status is pending

## 2023-07-08 NOTE — Telephone Encounter (Signed)
 Per p/a can take 5 business days for a determination. P/A sent on 4/8. Office has been made aware

## 2023-07-13 ENCOUNTER — Other Ambulatory Visit (HOSPITAL_COMMUNITY): Payer: Self-pay

## 2023-07-13 NOTE — Telephone Encounter (Signed)
 Pharmacy Patient Advocate Encounter  Received notification from [employer] that Prior Authorization for  FreeStyle Libre 3 Plus Sensor has been APPROVED from 4.9.25 to 12.31.25. Ran test claim, Copay is $0.00. This test claim was processed through [facility]- copay amounts may vary at other pharmacies due to pharmacy/plan contracts, or as the patient moves through the different stages of their insurance plan.   PA #/Case ID/Reference #: (Key: [id number])

## 2023-07-14 ENCOUNTER — Other Ambulatory Visit: Payer: Self-pay | Admitting: Medical

## 2023-07-14 NOTE — Telephone Encounter (Unsigned)
 Copied from CRM [id number]. Topic: Clinical - Prescription Issue >> Jul 14, 2023  2:19 PM Rosaria Common wrote: Reason for CRM: Continuous Glucose Sensor (FREESTYLE LIBRE 3 PLUS SENSOR) MISC  [facility] - [place], [place] [address] [place] [address] Phone: [phone] Fax: [phone] Hours: Not open 24 hours

## 2023-07-27 ENCOUNTER — Ambulatory Visit: Admitting: Medical

## 2023-07-27 ENCOUNTER — Encounter: Payer: Self-pay | Admitting: Medical

## 2023-07-27 VITALS — BP 130/60 | HR 56 | Temp 96.5°F | Ht 62.0 in | Wt 168.0 lb

## 2023-07-27 DIAGNOSIS — G8929 Other chronic pain: Secondary | ICD-10-CM | POA: Insufficient documentation

## 2023-07-27 DIAGNOSIS — R350 Frequency of micturition: Secondary | ICD-10-CM | POA: Diagnosis not present

## 2023-07-27 DIAGNOSIS — E039 Hypothyroidism, unspecified: Secondary | ICD-10-CM

## 2023-07-27 DIAGNOSIS — M25559 Pain in unspecified hip: Secondary | ICD-10-CM | POA: Insufficient documentation

## 2023-07-27 DIAGNOSIS — R351 Nocturia: Secondary | ICD-10-CM | POA: Diagnosis not present

## 2023-07-27 DIAGNOSIS — M25511 Pain in right shoulder: Secondary | ICD-10-CM | POA: Diagnosis not present

## 2023-07-27 DIAGNOSIS — I10 Essential (primary) hypertension: Secondary | ICD-10-CM | POA: Diagnosis not present

## 2023-07-27 DIAGNOSIS — M25512 Pain in left shoulder: Secondary | ICD-10-CM

## 2023-07-27 DIAGNOSIS — R5383 Other fatigue: Secondary | ICD-10-CM | POA: Insufficient documentation

## 2023-07-27 DIAGNOSIS — M2550 Pain in unspecified joint: Secondary | ICD-10-CM

## 2023-07-27 DIAGNOSIS — R400 Somnolence: Secondary | ICD-10-CM | POA: Insufficient documentation

## 2023-07-27 DIAGNOSIS — R413 Other amnesia: Secondary | ICD-10-CM | POA: Insufficient documentation

## 2023-07-27 DIAGNOSIS — F341 Dysthymic disorder: Secondary | ICD-10-CM | POA: Diagnosis not present

## 2023-07-27 DIAGNOSIS — M25619 Stiffness of unspecified shoulder, not elsewhere classified: Secondary | ICD-10-CM | POA: Insufficient documentation

## 2023-07-27 DIAGNOSIS — Z794 Long term (current) use of insulin: Secondary | ICD-10-CM

## 2023-07-27 DIAGNOSIS — R0683 Snoring: Secondary | ICD-10-CM | POA: Insufficient documentation

## 2023-07-27 DIAGNOSIS — E1169 Type 2 diabetes mellitus with other specified complication: Secondary | ICD-10-CM | POA: Diagnosis not present

## 2023-07-27 DIAGNOSIS — M791 Myalgia, unspecified site: Secondary | ICD-10-CM | POA: Insufficient documentation

## 2023-07-27 LAB — POCT URINALYSIS DIP (PROADVANTAGE DEVICE)
Bilirubin, UA: NEGATIVE
Blood, UA: NEGATIVE
Glucose, UA: NEGATIVE mg/dL
Nitrite, UA: NEGATIVE
Specific Gravity, Urine: 1.01
Urobilinogen, Ur: 0.2
pH, UA: 6 (ref 5.0–8.0)

## 2023-07-27 NOTE — Progress Notes (Signed)
 Subjective:  Shelly Blevins is a 78 y.o. female who presents for Chief Complaint  Patient presents with   Urinary Frequency    Has been getting up 3-4 times a night to use the bathroom. Also complains of fatigue, daughter thinks she may have sleep apnea, snores. Freestyle libre not working, also she doesn't want to take metformin  anymore.     Here with daughter  She has several concerns today.  She notes urination at night, 3-4 times per night for past few months.   Thought it was related to her kidneys.  Not a lot of urinary frequency in the day time.   No blood in urine, no fever, no body aches or chills.  No odor in urine, no cloudy urine.  She wants her kidneys checked  We had referred to neurology in fall 2024 to neurology regarding memory and sleep.   They had visit 03/2023, but at this point she hasn't had sleep evaluation, and feels like they really did not even address sleep at that appointment.  She snores loud.  She sleeps long hours but still has no energy.  Daughter has sleep apnea and thinks her mother has sleep apnea as well  She continues to have pains in her bilateral shoulders, right hip, decreased range of motion of shoulders in general.  This has been ongoing.  They went to urgent care possibly within the last year and x-rays are done.  But she still has pretty much daily pain in these areas.  She did see physical therapy in the past year but does not really feel like that helped at all  She is seeing endocrinology but feels like not a lot gets addressed at her appointments.  She is having problems with her freestyle libre.  The sensors seem to be defective and they have discussed this with pharmacy.  The pharmacist suggestion is just to get a new sensor although she cannot afford to do that every single time..  I will put the sensor on and it will stay on but does not seem to want to connect with her phone sometimes  She had trouble with sleep.  Sometimes she hallucinates at  night and sees things  She is on Cymbalta  for mood and for pain but that does not seem to be helping all that much  No other aggravating or relieving factors.    No other c/o.  Past Medical History:  Diagnosis Date   Allergy    Arthritis    CAD (coronary artery disease)    mild nonobstructive disease, 30% Cfx lesion   Cataract    Depression    Diabetes mellitus    insulin -dependent; Dr. Ronelle Coffee   Dyslipidemia    GERD (gastroesophageal reflux disease)    Hyperlipidemia    Hypertension    Hypothyroidism    Dr. Ronelle Coffee   Stroke [facility])    Current Outpatient Medications on File Prior to Visit  Medication Sig Dispense Refill   acetaminophen  (TYLENOL ) 500 MG tablet Take 1,000 mg by mouth every 6 (six) hours as needed for mild pain (pain score 1-3) or moderate pain (pain score 4-6).     Alcohol Swabs (ALCOHOL PADS) 70 % PADS USE AS DIRECTED TO test to check blood sugar AND FOR insulin  USE 200 each 1   allopurinol  (ZYLOPRIM ) 100 MG tablet TAKE 1 Tablet BY MOUTH ONCE DAILY (IN THE MORNING) 90 tablet 1   amLODipine -valsartan  (EXFORGE ) 10-160 MG tablet Take 1 tablet by mouth daily. 90 tablet  3   aspirin  EC 81 MG tablet Take 1 tablet (81 mg total) by mouth daily. 90 tablet 3   atorvastatin  (LIPITOR) 80 MG tablet Take 1 tablet (80 mg total) by mouth every evening. 90 tablet 1   BD PEN NEEDLE NANO 2ND GEN 32G X 4 MM MISC 2 (two) times daily. as directed     Blood Glucose Monitoring Suppl (FORACARE PREMIUM V10) DEVI USE TO test blood sugars THREE TIMES DAILY     donepezil  (ARICEPT ) 10 MG tablet TAKE 1 TABLET (10 MG TOTAL) BY MOUTH AT BEDTIME. HELPS SLOW DOWN MEMORY LOSS. 90 tablet 3   DULoxetine  (CYMBALTA ) 60 MG capsule Take 1 capsule (60 mg total) by mouth daily. 90 capsule 1   ezetimibe  (ZETIA ) 10 MG tablet Take 10 mg by mouth every morning.     famotidine  (PEPCID ) 40 MG tablet Take 40 mg by mouth 2 (two) times daily.     FORACARE PREMIUM V10 TEST test strip 1 each by Other route in the morning,  at noon, and at bedtime.     insulin  aspart (NOVOLOG ) 100 UNIT/ML FlexPen Inject 0-6 Units into the skin 3 (three) times daily with meals. Use Sliding Scale per instructions provided. 15 mL 11   insulin  degludec (TRESIBA  FLEXTOUCH) 100 UNIT/ML FlexTouch Pen Inject 10 Units into the skin daily at 10 pm. 3 mL 2   latanoprost  (XALATAN ) 0.005 % ophthalmic solution Place 1 drop into both eyes at bedtime.  5   levothyroxine  (SYNTHROID ) 75 MCG tablet TAKE 1 Tablet BY MOUTH ONCE DAILY (MORNING) 90 tablet 1   metFORMIN  (GLUCOPHAGE -XR) 500 MG 24 hr tablet TAKE 1 Tablet BY MOUTH TWICE DAILY WITH a meal (MORNING, bedtime) 180 tablet 1   Multiple Vitamins-Minerals (PRESERVISION AREDS 2 PO) Take 2 capsules by mouth daily.     spironolactone  (ALDACTONE ) 25 MG tablet Take 0.5 tablets (12.5 mg total) by mouth daily. 45 tablet 1   TRUEplus Lancets 33G MISC Apply 1 each topically 3 (three) times daily.     Vitamin D , Ergocalciferol , 50000 units CAPS Take 1 capsule by mouth once a week.     albuterol  (VENTOLIN  HFA) 108 (90 Base) MCG/ACT inhaler Inhale 2 puffs into the lungs every 6 (six) hours as needed for wheezing or shortness of breath. (Patient not taking: Reported on 07/27/2023)     Continuous Glucose Sensor (FREESTYLE LIBRE 3 PLUS SENSOR) MISC 1 each by Does not apply route as directed. Change sensor every 15 days (Patient not taking: Reported on 07/27/2023) 2 each 11   Glucagon  (GVOKE HYPOPEN  2-PACK) 1 MG/0.2ML SOAJ Inject 1 each into the skin as needed. (Patient not taking: Reported on 07/27/2023) 0.2 mL 1   No current facility-administered medications on file prior to visit.     The following portions of the patient's history were reviewed and updated as appropriate: allergies, current medications, past family history, past medical history, past social history, past surgical history and problem list.  ROS Otherwise as in subjective above    Objective: BP 130/60   Pulse (!) 56   Temp (!) 96.5 F (35.8  C) (Tympanic)   Ht 5\' 2"  (1.575 m)   Wt 168 lb (76.2 kg)   LMP  (LMP Unknown)   SpO2 99%   BMI 30.73 kg/m   BP Readings from Last 3 Encounters:  07/27/23 130/60  04/12/23 (!) 132/47  02/15/23 110/62   General appearance: alert, no distress, well developed, well nourished Neck nontender but decreased range of motion with  extension and rotation.  Left and right rotation about 70% of normal, neck extension only about 50% of normal.  Flexion is okay.  No mass or thyromegaly or lymphadenopathy Right shoulder with significantly decreased range of motion, pain with range of motion, somewhat decreased left shoulder range of motion Heart: RRR, normal S1, S2, no murmurs Lungs: CTA bilaterally, no wheezes, rhonchi, or rales Abdomen: +bs, soft, non tender, non distended, no masses, no hepatomegaly, no splenomegaly Pulses: 2+ radial pulses, 1+ pedal pulses, normal cap refill Ext: no edema Relatively normal strength and sensation of the arms    Assessment: Encounter Diagnoses  Name Primary?   Urinary frequency Yes   Nocturia    Type 2 diabetes mellitus with other specified complication, with long-term current use of insulin  (HCC)    Hypothyroidism, unspecified type    Essential hypertension    Polyarthralgia    Myalgia    Fatigue, unspecified type    Decreased range of motion of shoulder, unspecified laterality    Chronic pain of both shoulders    Hip pain, unspecified laterality    Memory loss      Plan: Urinary frequency, nocturia-possible overactive bladder.  We discussed possible differential.  Labs as below.  Consider trial of Detrol or Vesicare or similar medication.  Limit caffeine late in the day, limit fluid intake within an hour or 2 of bedtime  Diabetes-she sees endocrinology.  Updated labs today.  We will get her in touch with the representative for freestyle libre given the sensor issues she is having.  She continues on Tresiba  10 units daily, NovoLog  for mealtime use  and sliding scale, metformin  XR 500 mg 1 tablet twice daily  Hypothyroidism-continue current medication levothyroxine  75 mcg daily, updated labs today  Hypertension-continue current medications, amlodipine  valsartan  10/160 mg, 1 tab daily, spironolactone  12.5 mg daily  Polyarthralgia, myalgias-labs for further evaluation as below  Shoulder pains, decreased range of motion of shoulders, hip pains-referral to orthopedics  Hyperlipidemia-continue atorvastatin  80 mg daily,, Zetia  10 mg daily, aspirin  81 mg daily  Memory loss-continue Aricept  10mg  daily  Snoring, somnolence-I will reach out to the neurologist office she saw a few months ago and see if they can get her in the works for sleep evaluation  Fatigue-multifactorial, labs as below  Dysthymia-currently on Cymbalta  but this may not be helping all that well.  Pending labs, consider transitioning to different treatment   Aleecia was seen today for urinary frequency.  Diagnoses and all orders for this visit:  Urinary frequency -     POCT Urinalysis DIP (Proadvantage Device) -     Urine Culture  Nocturia -     Urine Culture  Type 2 diabetes mellitus with other specified complication, with long-term current use of insulin  (HCC) -     Hemoglobin A1c  Hypothyroidism, unspecified type -     TSH + free T4  Essential hypertension  Polyarthralgia -     CK -     Sedimentation rate -     AMB referral to orthopedics  Myalgia -     CK -     Sedimentation rate -     AMB referral to orthopedics  Fatigue, unspecified type -     TSH + free T4 -     CBC with Differential/Platelet -     Comprehensive metabolic panel with GFR  Decreased range of motion of shoulder, unspecified laterality -     AMB referral to orthopedics  Chronic pain of both shoulders -  AMB referral to orthopedics  Hip pain, unspecified laterality -     AMB referral to orthopedics  Memory loss  Spent > 45 minutes face to face with patient in  discussion of symptoms, evaluation, plan and recommendations.    Follow up: Pending labs

## 2023-07-28 ENCOUNTER — Other Ambulatory Visit: Payer: Self-pay | Admitting: Medical

## 2023-07-28 LAB — CBC WITH DIFFERENTIAL/PLATELET
Basophils Absolute: 0.1 10*3/uL (ref 0.0–0.2)
Basos: 1 %
EOS (ABSOLUTE): 0.3 10*3/uL (ref 0.0–0.4)
Eos: 4 %
Hematocrit: 31.8 % — ABNORMAL LOW (ref 34.0–46.6)
Hemoglobin: 10.6 g/dL — ABNORMAL LOW (ref 11.1–15.9)
Immature Grans (Abs): 0 10*3/uL (ref 0.0–0.1)
Immature Granulocytes: 0 %
Lymphocytes Absolute: 2.1 10*3/uL (ref 0.7–3.1)
Lymphs: 30 %
MCH: 30.6 pg (ref 26.6–33.0)
MCHC: 33.3 g/dL (ref 31.5–35.7)
MCV: 92 fL (ref 79–97)
Monocytes Absolute: 0.5 10*3/uL (ref 0.1–0.9)
Monocytes: 7 %
Neutrophils Absolute: 4 10*3/uL (ref 1.4–7.0)
Neutrophils: 58 %
Platelets: 253 10*3/uL (ref 150–450)
RBC: 3.46 x10E6/uL — ABNORMAL LOW (ref 3.77–5.28)
RDW: 13.7 % (ref 11.7–15.4)
WBC: 6.9 10*3/uL (ref 3.4–10.8)

## 2023-07-28 LAB — COMPREHENSIVE METABOLIC PANEL WITH GFR
ALT: 29 IU/L (ref 0–32)
AST: 31 IU/L (ref 0–40)
Albumin: 4.9 g/dL — ABNORMAL HIGH (ref 3.8–4.8)
Alkaline Phosphatase: 76 IU/L (ref 44–121)
BUN/Creatinine Ratio: 18 (ref 12–28)
BUN: 20 mg/dL (ref 8–27)
Bilirubin Total: 0.3 mg/dL (ref 0.0–1.2)
CO2: 22 mmol/L (ref 20–29)
Calcium: 9.4 mg/dL (ref 8.7–10.3)
Chloride: 99 mmol/L (ref 96–106)
Creatinine, Ser: 1.1 mg/dL — ABNORMAL HIGH (ref 0.57–1.00)
Globulin, Total: 2.9 g/dL (ref 1.5–4.5)
Glucose: 160 mg/dL — ABNORMAL HIGH (ref 70–99)
Potassium: 4.8 mmol/L (ref 3.5–5.2)
Sodium: 138 mmol/L (ref 134–144)
Total Protein: 7.8 g/dL (ref 6.0–8.5)
eGFR: 51 mL/min/{1.73_m2} — ABNORMAL LOW (ref >59)

## 2023-07-28 LAB — SEDIMENTATION RATE: Sed Rate: 25 mm/h (ref 0–40)

## 2023-07-28 LAB — HEMOGLOBIN A1C
Est. average glucose Bld gHb Est-mCnc: 186 mg/dL
Hgb A1c MFr Bld: 8.1 % — ABNORMAL HIGH (ref 4.8–5.6)

## 2023-07-28 LAB — CK: Total CK: 252 U/L — ABNORMAL HIGH (ref 32–182)

## 2023-07-28 LAB — TSH+FREE T4
Free T4: 0.86 ng/dL (ref 0.82–1.77)
TSH: 0.206 u[IU]/mL — ABNORMAL LOW (ref 0.450–4.500)

## 2023-07-28 MED ORDER — INSULIN ASPART 100 UNIT/ML FLEXPEN: 0.0000 [IU] | PEN_INJECTOR | Freq: Three times a day (TID) | SUBCUTANEOUS | 11 refills | Status: DC

## 2023-07-28 MED ORDER — LEVOTHYROXINE SODIUM 50 MCG PO TABS
50.0000 ug | ORAL_TABLET | Freq: Every day | ORAL | 2 refills | Status: DC
Start: 2023-07-28 — End: 2023-10-04

## 2023-07-28 MED ORDER — SOLIFENACIN SUCCINATE 5 MG PO TABS: 5.0000 mg | ORAL_TABLET | Freq: Every day | ORAL | 2 refills | Status: DC

## 2023-07-28 MED ORDER — FLUOXETINE HCL 20 MG PO CAPS: 20.0000 mg | ORAL_CAPSULE | Freq: Every day | ORAL | 2 refills | Status: DC

## 2023-07-28 MED ORDER — TRESIBA FLEXTOUCH 100 UNIT/ML ~~LOC~~ SOPN
15.0000 [IU] | PEN_INJECTOR | Freq: Every day | SUBCUTANEOUS | 2 refills | Status: DC
Start: 2023-07-28 — End: 2023-10-20

## 2023-07-28 NOTE — Progress Notes (Signed)
 Thyroid  numbers look little unusual.  Diabetes marker too high at 8 1 point percent you have chronic anemia but stable.  Chronic kidney disease is stable, electrolytes okay, liver okay.  Sed rate marker of inflammation normal.  Still pending urine culture  Expect a phone call about referral to orthopedics  Recommendations 1-I recommend lowering your dose of thyroid  medicine.  I will send the new dose to the pharmacy, 50mcg daily 2-begin trial of medication sent to the pharmacy to help with frequent urination, Vesicare.  This will be once daily 3-I would continue the metformin  at the same dose.  I recommend increasing your Tresiba  to 15 units daily.  Continue NovoLog  the same for mealtime and use it for sliding scale for worse sugars.  If any low readings in the next 2 weeks certainly let us  know right away 4-lets switch from Cymbalta  to Fluoxetine/prozac and see if this helps with your mood better. 5-neurology should be contacting you about sleep evaluation.  I talked to them yesterday   Recheck in 1 month

## 2023-07-29 ENCOUNTER — Telehealth: Payer: Self-pay | Admitting: Medical

## 2023-07-29 LAB — URINE CULTURE

## 2023-07-29 NOTE — Progress Notes (Signed)
 Urine culture negative for infection

## 2023-07-29 NOTE — Telephone Encounter (Signed)
 Grand daughter stopped by office asking about pt's meds, said pharmacy called about Humalog , but was told pt was on Novolog  & didn't understand why they were saying Humalog .  Also is confused on dosing.  I called pharmacy and [facility] said Novolog  not covered & sent P.A. but Humalog  is preferred and goes thru for $75 for 1 box 63 days. Pharmacy wants to know if you want to switch & if so need new Rx.  Also once that is settled please call grand daughter who is on DPR and go over instructions for dosage, she is confused on mealtime & sliding scale dosage.

## 2023-07-29 NOTE — Telephone Encounter (Signed)
**Note De-identified  [place] Obfuscation** Please advise 

## 2023-07-30 ENCOUNTER — Other Ambulatory Visit: Payer: Self-pay | Admitting: Medical

## 2023-07-30 MED ORDER — INSULIN LISPRO 100 UNIT/ML IJ SOLN: 0.0000 [IU] | Freq: Three times a day (TID) | INTRAMUSCULAR | 3 refills | Status: DC

## 2023-07-30 NOTE — Telephone Encounter (Signed)
 Sent through my chart

## 2023-08-02 NOTE — Telephone Encounter (Unsigned)
 Copied from CRM [id number]. Topic: Clinical - Medication Question >> Aug 02, 2023  1:17 PM Baldomero Bone wrote: Reason for CRM: Shelly Blevins with [facility] confirming the DULoxetine  (CYMBALTA ) 60 MG capsule being discontinued. Callback number is [phone]

## 2023-08-10 ENCOUNTER — Telehealth: Payer: Self-pay

## 2023-08-10 DIAGNOSIS — I10 Essential (primary) hypertension: Secondary | ICD-10-CM | POA: Diagnosis not present

## 2023-08-10 DIAGNOSIS — N1831 Chronic kidney disease, stage 3a: Secondary | ICD-10-CM | POA: Diagnosis not present

## 2023-08-10 DIAGNOSIS — E1165 Type 2 diabetes mellitus with hyperglycemia: Secondary | ICD-10-CM | POA: Diagnosis not present

## 2023-08-10 DIAGNOSIS — E039 Hypothyroidism, unspecified: Secondary | ICD-10-CM | POA: Diagnosis not present

## 2023-08-10 NOTE — Telephone Encounter (Signed)
 Per [employer] requesting a new rx to be fax to [facility] [phone].

## 2023-08-11 ENCOUNTER — Encounter: Payer: Self-pay | Admitting: Neurology

## 2023-08-11 ENCOUNTER — Ambulatory Visit (INDEPENDENT_AMBULATORY_CARE_PROVIDER_SITE_OTHER): Admitting: Neurology

## 2023-08-11 VITALS — BP 126/55 | HR 48 | Ht 63.0 in | Wt 169.2 lb

## 2023-08-11 DIAGNOSIS — R0681 Apnea, not elsewhere classified: Secondary | ICD-10-CM | POA: Diagnosis not present

## 2023-08-11 DIAGNOSIS — R0683 Snoring: Secondary | ICD-10-CM | POA: Diagnosis not present

## 2023-08-11 DIAGNOSIS — R351 Nocturia: Secondary | ICD-10-CM

## 2023-08-11 DIAGNOSIS — R6889 Other general symptoms and signs: Secondary | ICD-10-CM | POA: Diagnosis not present

## 2023-08-11 DIAGNOSIS — Z9189 Other specified personal risk factors, not elsewhere classified: Secondary | ICD-10-CM

## 2023-08-11 DIAGNOSIS — G4719 Other hypersomnia: Secondary | ICD-10-CM

## 2023-08-11 DIAGNOSIS — E663 Overweight: Secondary | ICD-10-CM

## 2023-08-11 DIAGNOSIS — R519 Headache, unspecified: Secondary | ICD-10-CM | POA: Diagnosis not present

## 2023-08-11 NOTE — Progress Notes (Signed)
 Subjective:    Patient ID: Shelly Blevins is a 78 y.o. female.  HPI    Debbra Fairy, MD, PhD [facility] [address] [address] [place], [place] [address]  Dear Shelly Blevins,  I saw your patient, Shelly Blevins, upon your kind request in my sleep clinic today for initial consultation of her sleep disorder, in particular, concern for underlying obstructive sleep apnea.  The patient is accompanied by her granddaughter and great-granddaughter today.  As you know, [name] is a 78 year old female with an underlying complex medical history of diabetes, hypothyroidism, hypertension, hyperlipidemia, vitamin D  deficiency, osteoarthritis, depression, history of stroke and memory loss (followed by Dr. Tresia Fruit and Clem Currier, NP in this office), history of gout, high risk medication use, reflux disease, and overweight state, who reports snoring and excessive daytime somnolence.  Her Epworth sleepiness score is 18 out of 24, fatigue severity score is 63 out of 63.  She has vivid dreams and reports night time hallucinations.  I reviewed your office visit note from 11/10/2022.  Has been on donepezil .  Of note, her chronic rate is below 50 today.  She reports not sleeping well at night and staying in bed even if she is not sleeping.  She does not have a set schedule, she may be in bed around 9 PM and stay in bed until the next day around lunchtime even.  She has significant nocturia about 4-5 times per average night and reports frequent morning headaches.  She is familiar with sleep apnea diagnosis as her granddaughter is here and uses CPAP.  Her granddaughter provides additional information.  Patient lives with the patient's sister and patient's grandson.  She does not drink caffeine daily, she drinks no alcohol.  She quit smoking many years ago.  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  She does not have a TV in her bedroom, she does not share her  bedroom.  No pets in the household.  Her Past Medical History Is Significant For: Past Medical History:  Diagnosis Date   Allergy    Arthritis    CAD (coronary artery disease)    mild nonobstructive disease, 30% Cfx lesion   Cataract    Depression    Diabetes mellitus    insulin -dependent; Dr. Ronelle Coffee   Dyslipidemia    GERD (gastroesophageal reflux disease)    Hyperlipidemia    Hypertension    Hypothyroidism    Dr. Ronelle Coffee   Stroke [facility])     Her Past Surgical History Is Significant For: Past Surgical History:  Procedure Laterality Date   ABDOMINAL HYSTERECTOMY     CARDIAC CATHETERIZATION  02/17/2007   mild CAD with 50% prox L Cfx stenosis, normal LV function (Dr. Tammy Fallen)   CATARACT EXTRACTION Left    CHOLECYSTECTOMY     NM MYOCAR PERF WALL MOTION  2009   lexiscan  myoview  - perfusion defect in anterior myociaral region (breast attenuation) - low risk scan - EF 69%   THYROIDECTOMY     TRANSTHORACIC ECHOCARDIOGRAM  2008   normal study     Her Family History Is Significant For: Family History  Problem Relation Age of Onset   Heart failure Mother    Diabetes Mother    Diabetes Father    Dementia Sister    Diabetes Sister    Heart failure Brother 38   Diabetes Brother    Diabetes Daughter    Hypertension Daughter  Hyperlipidemia Daughter    Diabetes Daughter    Bone cancer Daughter    Colon cancer Neg Hx    Esophageal cancer Neg Hx    Stomach cancer Neg Hx    Rectal cancer Neg Hx    Alzheimer's disease Neg Hx     Her Social History Is Significant For: Social History   Socioeconomic History   Marital status: Widowed    Spouse name: Not on file   Number of children: 2   Years of education: 10   Highest education level: Not on file  Occupational History    Employer: [employer]  Tobacco Use   Smoking status: Former    Current packs/day: 0.00    Types: Cigarettes    Quit date: 04/02/1986    Years since quitting: 37.3   Smokeless tobacco: Never   Vaping Use   Vaping status: Never Used  Substance and Sexual Activity   Alcohol use: No   Drug use: No   Sexual activity: Not on file  Other Topics Concern   Not on file  Social History Narrative   Caffiene occasional 1 cup   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, 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    Social Drivers of Corporate investment banker Strain: Low Risk  (09/19/2020)   Overall Financial Resource Strain (CARDIA)    Difficulty of Paying Living Expenses: Not very hard  Food Insecurity: No Food Insecurity (08/19/2022)   Hunger Vital Sign    Worried About Running Out of Food in the Last Year: Never true    Ran Out of Food in the Last Year: Never true  Transportation Needs: No Transportation Needs (08/19/2022)   PRAPARE - Administrator, Civil Service (Medical): No    Lack of Transportation (Non-Medical): No  Physical Activity: Not on file  Stress: Not on file  Social Connections: Not on file    Her Allergies Are:  No Known Allergies:   Her Current Medications Are:  Outpatient Encounter Medications as of 08/11/2023  Medication Sig   acetaminophen  (TYLENOL ) 500 MG tablet Take 1,000 mg by mouth every 6 (six) hours as needed for mild pain (pain score 1-3) or moderate pain (pain score 4-6).   albuterol  (VENTOLIN  HFA) 108 (90 Base) MCG/ACT inhaler Inhale 2 puffs into the lungs every 6 (six) hours as needed for wheezing or shortness of breath.   Alcohol Swabs (ALCOHOL PADS) 70 % PADS USE AS DIRECTED TO test to check blood sugar AND FOR insulin  USE   allopurinol  (ZYLOPRIM ) 100 MG tablet TAKE 1 Tablet BY MOUTH ONCE DAILY (IN THE MORNING)   amLODipine -valsartan  (EXFORGE ) 10-160 MG tablet Take 1 tablet by mouth daily.   aspirin  EC 81 MG tablet Take 1 tablet (81 mg total) by mouth daily.   atorvastatin  (LIPITOR) 80 MG tablet Take 1 tablet (80 mg total) by mouth every evening.   BD PEN NEEDLE NANO 2ND GEN 32G X 4 MM MISC 2 (two) times daily. as directed   Blood Glucose Monitoring Suppl (FORACARE  PREMIUM V10) DEVI USE TO test blood sugars THREE TIMES DAILY   Continuous Glucose Sensor (FREESTYLE LIBRE 3 PLUS SENSOR) MISC 1 each by Does not apply route as directed. Change sensor every 15 days   donepezil  (ARICEPT ) 10 MG tablet TAKE 1 TABLET (10 MG TOTAL) BY MOUTH AT BEDTIME. HELPS SLOW DOWN MEMORY LOSS.   ezetimibe  (ZETIA ) 10 MG tablet Take 10 mg by mouth every morning.   famotidine  (PEPCID ) 40 MG tablet  Take 40 mg by mouth 2 (two) times daily.   FLUoxetine  (PROZAC ) 20 MG capsule Take 1 capsule (20 mg total) by mouth daily.   FORACARE PREMIUM V10 TEST test strip 1 each by Other route in the morning, at noon, and at bedtime.   Glucagon  (GVOKE HYPOPEN  2-PACK) 1 MG/0.2ML SOAJ Inject 1 each into the skin as needed.   insulin  degludec (TRESIBA  FLEXTOUCH) 100 UNIT/ML FlexTouch Pen Inject 15 Units into the skin daily at 10 pm.   insulin  lispro (HUMALOG ) 100 UNIT/ML injection Inject 0-0.06 mLs (0-6 Units total) into the skin 3 (three) times daily with meals.   latanoprost  (XALATAN ) 0.005 % ophthalmic solution Place 1 drop into both eyes at bedtime.   levothyroxine  (SYNTHROID ) 50 MCG tablet Take 1 tablet (50 mcg total) by mouth daily before breakfast.   metFORMIN  (GLUCOPHAGE -XR) 500 MG 24 hr tablet TAKE 1 Tablet BY MOUTH TWICE DAILY WITH a meal (MORNING, bedtime)   Multiple Vitamins-Minerals (PRESERVISION AREDS 2 PO) Take 2 capsules by mouth daily.   solifenacin  (VESICARE ) 5 MG tablet Take 1 tablet (5 mg total) by mouth daily.   spironolactone  (ALDACTONE ) 25 MG tablet Take 0.5 tablets (12.5 mg total) by mouth daily.   TRUEplus Lancets 33G MISC Apply 1 each topically 3 (three) times daily.   [DISCONTINUED] Vitamin D , Ergocalciferol , 50000 units CAPS Take 1 capsule by mouth once a week. (Patient not taking: Reported on 08/11/2023)   No facility-administered encounter medications on file as of 08/11/2023.  :   Review of Systems:  Out of a complete 14 point review of systems, all are reviewed and  negative with the exception of these symptoms as listed below:  Review of Systems  Neurological:        Trouble sleeping, snoring, hypertension.  ESS 17 FSS 63 no sleep study. Vivid dreams, has shaking when asleep, has hallucinations.  Has donepezil  per pcp.   HR 47 machine 48 palp.    Objective:  Neurological Exam  Physical Exam Physical Examination:   Vitals:   08/11/23 0923  BP: (!) 126/55  Pulse: (!) 48    General Examination: The patient is a very pleasant 78 y.o. female in no acute distress. She appears well-developed and well-nourished and well groomed.   HEENT: Normocephalic, atraumatic, pupils are equal, round and reactive to light, extraocular tracking is good without limitation to gaze excursion or nystagmus noted. Hearing is grossly intact. Face is symmetric with normal facial animation. Speech is clear with no dysarthria noted. There is no hypophonia. There is no lip, neck/head, jaw or voice tremor. Neck is supple with full range of passive and active motion. There are no carotid bruits on auscultation. Oropharynx exam reveals: mild mouth dryness, adequate dental hygiene with dentures on top and several missing teeth on the bottom, moderate airway crowding secondary to thicker soft palate and redundant soft palate, slightly wider uvula.  Tonsils about 1-2+ on the right and 1+ on the left.  Tongue protrudes centrally and palate elevates symmetrically, neck circumference 16 and three-quarter inches.  No overbite.    Chest: Clear to auscultation without wheezing, rhonchi or crackles noted.  Heart: S1+S2+0, regular and normal without murmurs, rubs or gallops noted.   Abdomen: Soft, non-tender and non-distended.  Extremities: There is nonpitting puffiness in the distal lower extremities bilaterally.   Skin: Warm and dry without trophic changes noted.   Musculoskeletal: exam reveals no obvious joint deformities.   Neurologically:  Mental status: The patient is awake,  attention, able to  her history with details provided by her granddaughter.  Thought process is linear. Mood is normal and affect is blunted.  Cranial nerves II - XII are as described above under HEENT exam.  Motor exam: Normal bulk, strength and tone is noted. There is no obvious action or resting tremor.  Fine motor skills and coordination: grossly intact.  Cerebellar testing: No dysmetria or intention tremor. There is no truncal or gait ataxia.  Sensory exam: intact to light touch in the upper and lower extremities.  Gait, station and balance: She stands slowly but requires no assistance.  Posture is age-appropriate and stance is narrow based. Gait shows normal stride length and normal pace. No problems turning are noted.   Assessment and Plan:  In summary, CHAYLENE LEIPOLD is a very pleasant 78 year old female with an underlying complex medical history of diabetes, hypothyroidism, hypertension, hyperlipidemia, vitamin D  deficiency, osteoarthritis, depression, history of stroke and memory loss (followed by Dr. Tresia Fruit and Clem Currier, NP in this office), history of gout, high risk medication use, reflux disease, and overweight state, whose history and physical exam are concerning for sleep disordered breathing, particularly obstructive sleep apnea (OSA). A laboratory attended sleep study is typically considered "gold standard" for evaluation of sleep disordered breathing.   I had a long chat with the patient and her GD about my findings and the diagnosis of sleep apnea, particularly OSA, its prognosis and treatment options. We talked about medical/conservative treatments, surgical interventions and non-pharmacological approaches for symptom control. I explained, in particular, the risks and ramifications of untreated moderate to severe OSA, especially with respect to developing cardiovascular disease down the road, including congestive heart failure (CHF), difficult to treat hypertension, cardiac  arrhythmias (particularly A-fib), neurovascular complications including TIA, stroke and dementia. Even type 2 diabetes has, in part, been linked to untreated OSA. Symptoms of untreated OSA may include (but may not be limited to) daytime sleepiness, nocturia (i.e. frequent nighttime urination), memory problems, mood irritability and suboptimally controlled or worsening mood disorder such as depression and/or anxiety, lack of energy, lack of motivation, physical discomfort, as well as recurrent headaches, especially morning or nocturnal headaches. We talked about the importance of maintaining a healthy lifestyle and striving for healthy weight. In addition, we talked about the importance of striving for and maintaining good sleep hygiene. I recommended a sleep study at this time. I outlined the differences between a laboratory attended sleep study which is considered more comprehensive and accurate over the option of a home sleep test (HST); the latter may lead to underestimation of sleep disordered breathing in some instances and does not help with diagnosing upper airway resistance syndrome and is not accurate enough to diagnose primary central sleep apnea typically. I outlined possible surgical and non-surgical treatment options of OSA, including the use of a positive airway pressure (PAP) device (i.e. CPAP, AutoPAP/APAP or BiPAP in certain circumstances), a custom-made dental device (aka oral appliance, which would require a referral to a specialist dentist or orthodontist typically, and is generally speaking not considered for patients with full dentures or edentulous state), upper airway surgical options, such as traditional UPPP (which is not considered a first-line treatment) or the Inspire device (hypoglossal nerve stimulator, which would involve a referral for consultation with an ENT surgeon, after careful selection, following inclusion criteria - also not first-line treatment). I explained the PAP  treatment option to the patient in detail, as this is generally considered first-line treatment.  The patient indicated that she would be willing to  try PAP therapy, if the need arises. I explained the importance of being compliant with PAP treatment, not only for insurance purposes but primarily to improve patient's symptoms symptoms, and for the patient's long term health benefit, including to reduce Her cardiovascular risks longer-term.    We will pick up our discussion about the next steps and treatment options after testing.  We will keep them posted as to the test results by phone call and/or MyChart messaging where possible.  We will plan to follow-up in sleep clinic accordingly as well.  I answered all their questions today and the patient and her GD were in agreement.   I encouraged them to call with any interim questions, concerns, problems or updates or email us  through MyChart.  Generally speaking, sleep test authorizations may take up to 2 weeks, sometimes less, sometimes longer, the patient is encouraged to get in touch with us  if they do not hear back from the sleep lab staff directly within the next 2 weeks.  Thank you very much for allowing me to participate in the care of this nice patient. If I can be of any further assistance to you please do not hesitate to call me at [phone].  Sincerely,   Debbra Fairy, MD, PhD

## 2023-08-11 NOTE — Patient Instructions (Signed)

## 2023-08-12 ENCOUNTER — Telehealth: Payer: Self-pay | Admitting: Medical

## 2023-08-12 NOTE — Telephone Encounter (Signed)
I do not see this on med list. Please advise.

## 2023-08-12 NOTE — Telephone Encounter (Signed)
 Copied from CRM [id number]. Topic: Clinical - Medication Question >> Aug 12, 2023  4:21 PM Shelly Blevins wrote: Reason for CRM: Patients granddaughter is calling to report that farxiga  came in the mail today. Wanting to know did Shelly Blevins prescribe this for the patient and should she take it?

## 2023-08-13 ENCOUNTER — Other Ambulatory Visit (INDEPENDENT_AMBULATORY_CARE_PROVIDER_SITE_OTHER): Payer: Self-pay

## 2023-08-13 ENCOUNTER — Ambulatory Visit: Payer: Self-pay | Admitting: Orthopedic Surgery

## 2023-08-13 DIAGNOSIS — M25561 Pain in right knee: Secondary | ICD-10-CM

## 2023-08-13 DIAGNOSIS — M25512 Pain in left shoulder: Secondary | ICD-10-CM

## 2023-08-13 DIAGNOSIS — M25571 Pain in right ankle and joints of right foot: Secondary | ICD-10-CM

## 2023-08-13 DIAGNOSIS — M25572 Pain in left ankle and joints of left foot: Secondary | ICD-10-CM | POA: Diagnosis not present

## 2023-08-13 DIAGNOSIS — M25562 Pain in left knee: Secondary | ICD-10-CM

## 2023-08-13 DIAGNOSIS — M25511 Pain in right shoulder: Secondary | ICD-10-CM

## 2023-08-13 DIAGNOSIS — M170 Bilateral primary osteoarthritis of knee: Secondary | ICD-10-CM

## 2023-08-13 DIAGNOSIS — G8929 Other chronic pain: Secondary | ICD-10-CM

## 2023-08-13 NOTE — Telephone Encounter (Signed)
 Pt's granddaughter was told to contact endo about this medication

## 2023-08-14 ENCOUNTER — Encounter: Payer: Self-pay | Admitting: Orthopedic Surgery

## 2023-08-14 NOTE — Progress Notes (Addendum)
 Office Visit Note   Patient: Shelly Blevins           Date of Birth: 1945-07-14           MRN: [id number] Visit Date: 08/13/2023 Requested by: Claudene Crystal, PA-C [address] [place],  [place] [address] PCP: Claudene Crystal, PA-C  Subjective: Chief Complaint  Patient presents with   Left Leg - Pain   Right Leg - Pain    HPI: Shelly Blevins is a 78 y.o. female who presents to the office reporting multiple orthopedic complaints.  Patient states she has multiple joint pains and hurts all over.  Some of her joint pains wakes her from sleep at night.  She states that her knees hurt at times but most of her pain is anteriorly along the tibial crest.  Symptoms ongoing for months.  Tylenol  helps some.  Hard for her to walk outside.  She cannot really walk for exercise.  Denies any low back pain or radicular type symptoms.  Describes both ankle pain as well including stiffness and mild calf pain with ambulation.  She also describes bilateral shoulder pain with stiffness and difficulty going overhead.  She has been seen at another orthopedic office who referred her to rheumatology.  It sounds like she did go but did not like the rheumatologist.  Rheumatologic labs were drawn a year ago with unknown results..                ROS: All systems reviewed are negative as they relate to the chief complaint within the history of present illness.  Patient denies fevers or chills.  Assessment & Plan: Visit Diagnoses:  1. Bilateral primary osteoarthritis of knee   2. Chronic pain of both knees   3. Chronic pain of both shoulders   4. Chronic pain of both ankles     Plan: Impression is multiple joint pains.  She also has diminished pulses in her lower extremities.  There could be a component of claudication to her lower extremity pain.  Also has fairly significant restricted subtalar joint motion indicating a component of arthritis in the midfoot region.  The knees themselves look pretty  reasonable on radiographs with no effusion in either knee.  Shoulders do have diminished range of motion passively and actively but radiographically they appear to be in good condition from an arthritis standpoint.  Plan at this time is to check ABIs.  Would also like to repeat sed rate CRP ANA rheumatoid factor anti-CCP antibody and uric acid.  I think also checking a vitamin D  level as an add-on to the labs that were drawn could be helpful and diagnostic.  Patient also has a history of gout which could be at play here.  Depending on what all the studies show would dictate a likely referral to a different rheumatologist.  I do not see anything definitively surgical at this time from an orthopedic standpoint.  Follow-Up Instructions: No follow-ups on file.   Orders:  Orders Placed This Encounter  Procedures   XR Ankle Complete Right   XR Ankle Complete Left   XR KNEE 3 VIEW LEFT   XR KNEE 3 VIEW RIGHT   XR Shoulder Left   XR Shoulder Right   Sed Rate (ESR)   C-reactive protein   Antinuclear Antib (ANA)   Rheumatoid Factor   Cyclic citrul peptide antibody, IgG   Uric acid   VAS US  ABI WITH/WO TBI   No orders of the defined  types were placed in this encounter.     Procedures: No procedures performed   Clinical Data: No additional findings.  Objective: Vital Signs: LMP  (LMP Unknown)   Physical Exam:  Constitutional: Patient appears well-developed HEENT:  Head: Normocephalic Eyes:EOM are normal Neck: Normal range of motion Cardiovascular: Normal rate Pulmonary/chest: Effort normal Neurologic: Patient is alert Skin: Skin is warm Psychiatric: Patient has normal mood and affect  Ortho Exam: Ortho exam demonstrates warm and perfused feet but pulses are not palpable.  Either DP or posterior tib.  Patient has palpable nontender intact anterior tib posterior to peroneal and Achilles tendons but does have diminished subtalar range of motion bilaterally right slightly worse than  left.  Tibiotalar motion also slightly restricted with about 10 degrees of ankle dorsiflexion and 15 of plantarflexion.  No pain with transverse tarsal range of motion in either foot.  Both knees have full range of motion with no effusion.  No groin pain with internal/external rotation of either leg.  No joint warmth or synovitis in the feet ankles or knees.  No muscle atrophy asymmetrically left versus right.  Patient has bilateral 5 out of 5 grip EPL FPL interosseous wrist flexion wrist extension bicep triceps and deltoid strength.  Bilateral palpable radial pulses and no paresthesias C5-T1 in either arm.  Neck range of motion flexion chin to chest with extension approximately 50 degrees with approximately 50 degrees of rotation bilaterally.  No masses lymphadenopathy or skin changes around the neck or shoulder girdle region bilaterally  Patient has pretty reasonable rotator cuff strength infraspinatus supraspinatus and subscap muscle testing but does have diminished external rotation on the right at 20 degrees compared to the left at 40 degrees.  Passively we can get her up above 90 degrees of abduction and 90 degrees of forward flexion.  Specialty Comments:  No specialty comments available.  Imaging: No results found.   PMFS History: Patient Active Problem List   Diagnosis Date Noted   Urinary frequency 07/27/2023   Nocturia 07/27/2023   Polyarthralgia 07/27/2023   Myalgia 07/27/2023   Decreased range of motion of shoulder 07/27/2023   Memory loss 07/27/2023   Hip pain 07/27/2023   Chronic pain of both shoulders 07/27/2023   Fatigue 07/27/2023   Dysthymia 07/27/2023   Daytime somnolence 07/27/2023   Snoring 07/27/2023   Polypharmacy 11/11/2022   Loss of weight 10/13/2022   Medication management 09/03/2022   History of gout 09/03/2022   Fall 08/19/2022   Hypoglycemia associated with diabetes (HCC) 08/19/2022   Near syncope 08/19/2022   Elevated uric acid in blood 05/12/2021    Chronic gout without tophus 05/12/2021   High risk medication use 05/12/2021   Anemia 01/23/2021   Diabetic neuropathy (HCC) 01/15/2021   Long term (current) use of insulin  (HCC) 01/15/2021   Diabetes mellitus (HCC) 12/31/2020   Hypertrophy of nail 12/31/2020   Pain in joints of both feet 12/31/2020   Abnormal ankle brachial index (ABI) 12/31/2020   Encounter for health maintenance examination in adult 04/25/2020   Medicare annual wellness visit, subsequent 04/25/2020   Memory change 04/25/2020   Post-menopausal 04/25/2020   Generalized abdominal pain 02/29/2020   Constipation 02/29/2020   Bloating 02/29/2020   Osteoarthritis of both knees 01/10/2020   Chronic pain of both knees 03/28/2019   Vitamin D  deficiency 03/28/2019   Ataxia 11/02/2016   Estrogen deficiency 11/02/2016   Vaccine counseling 10/09/2015   Advanced directives, counseling/discussion 10/09/2015   Need for prophylactic vaccination against Streptococcus pneumoniae (pneumococcus)  10/09/2015   Hypothyroidism 04/17/2015   Essential hypertension 06/06/2013   Dyslipidemia 06/06/2013   Coronary artery disease due to lipid rich plaque 06/06/2013   GERD 10/29/2008   Past Medical History:  Diagnosis Date   Allergy    Arthritis    CAD (coronary artery disease)    mild nonobstructive disease, 30% Cfx lesion   Cataract    Depression    Diabetes mellitus    insulin -dependent; Dr. Ronelle Coffee   Dyslipidemia    GERD (gastroesophageal reflux disease)    Hyperlipidemia    Hypertension    Hypothyroidism    Dr. Ronelle Coffee   Stroke [facility])     Family History  Problem Relation Age of Onset   Heart failure Mother    Diabetes Mother    Diabetes Father    Dementia Sister    Diabetes Sister    Heart failure Brother 42   Diabetes Brother    Diabetes Daughter    Hypertension Daughter    Hyperlipidemia Daughter    Diabetes Daughter    Bone cancer Daughter    Colon cancer Neg Hx    Esophageal cancer Neg Hx    Stomach cancer Neg  Hx    Rectal cancer Neg Hx    Alzheimer's disease Neg Hx     Past Surgical History:  Procedure Laterality Date   ABDOMINAL HYSTERECTOMY     CARDIAC CATHETERIZATION  02/17/2007   mild CAD with 50% prox L Cfx stenosis, normal LV function (Dr. Tammy Fallen)   CATARACT EXTRACTION Left    CHOLECYSTECTOMY     NM MYOCAR PERF WALL MOTION  2009   lexiscan  myoview  - perfusion defect in anterior myociaral region (breast attenuation) - low risk scan - EF 69%   THYROIDECTOMY     TRANSTHORACIC ECHOCARDIOGRAM  2008   normal study    Social History   Occupational History    Employer: [employer]  Tobacco Use   Smoking status: Former    Current packs/day: 0.00    Types: Cigarettes    Quit date: 04/02/1986    Years since quitting: 37.3   Smokeless tobacco: Never  Vaping Use   Vaping status: Never Used  Substance and Sexual Activity   Alcohol use: No   Drug use: No   Sexual activity: Not on file

## 2023-08-16 ENCOUNTER — Telehealth: Payer: Self-pay | Admitting: Neurology

## 2023-08-16 NOTE — Telephone Encounter (Signed)
 08/16/23 - No VM set up AG   08/16/23 [employer] medicare no auth req EE

## 2023-08-16 NOTE — Progress Notes (Signed)
 Terri added to order.

## 2023-08-17 DIAGNOSIS — M25561 Pain in right knee: Secondary | ICD-10-CM | POA: Diagnosis not present

## 2023-08-17 DIAGNOSIS — M25511 Pain in right shoulder: Secondary | ICD-10-CM | POA: Diagnosis not present

## 2023-08-17 DIAGNOSIS — M170 Bilateral primary osteoarthritis of knee: Secondary | ICD-10-CM | POA: Diagnosis not present

## 2023-08-17 DIAGNOSIS — M25512 Pain in left shoulder: Secondary | ICD-10-CM | POA: Diagnosis not present

## 2023-08-17 DIAGNOSIS — E559 Vitamin D deficiency, unspecified: Secondary | ICD-10-CM | POA: Diagnosis not present

## 2023-08-17 DIAGNOSIS — G8929 Other chronic pain: Secondary | ICD-10-CM | POA: Diagnosis not present

## 2023-08-19 LAB — C-REACTIVE PROTEIN: CRP: <3 mg/L (ref <8.0)

## 2023-08-19 LAB — CYCLIC CITRUL PEPTIDE ANTIBODY, IGG: Cyclic Citrullin Peptide Ab: <16 U

## 2023-08-19 LAB — ANA: Anti Nuclear Antibody (ANA): NEGATIVE

## 2023-08-19 LAB — VITAMIN D 25 HYDROXY (VIT D DEFICIENCY, FRACTURES): Vit D, 25-Hydroxy: 48 ng/mL (ref 30–100)

## 2023-08-19 LAB — SEDIMENTATION RATE: Sed Rate: 31 mm/h — ABNORMAL HIGH (ref 0–30)

## 2023-08-19 LAB — URIC ACID: Uric Acid, Serum: 4.5 mg/dL (ref 2.5–7.0)

## 2023-08-19 LAB — RHEUMATOID FACTOR: Rheumatoid fact SerPl-aCnc: <10 [IU]/mL (ref <14)

## 2023-08-30 ENCOUNTER — Encounter (HOSPITAL_COMMUNITY): Payer: Self-pay

## 2023-09-01 ENCOUNTER — Ambulatory Visit: Payer: Self-pay | Admitting: Orthopedic Surgery

## 2023-09-01 NOTE — Progress Notes (Signed)
 Labs generally okay.  Vitamin D  especially was okay.  I think if she is having all of this multiple joint pain then she should be referred to rheumatology.  I do not see an orthopedic issue at this time.  Thanks please call to let her know that her labs looked good.

## 2023-09-01 NOTE — Progress Notes (Signed)
 I tried to call, voicemail not set up

## 2023-09-03 ENCOUNTER — Encounter: Payer: Self-pay | Admitting: Radiology

## 2023-09-03 ENCOUNTER — Telehealth: Payer: Self-pay | Admitting: Orthopedic Surgery

## 2023-09-03 DIAGNOSIS — M170 Bilateral primary osteoarthritis of knee: Secondary | ICD-10-CM

## 2023-09-03 DIAGNOSIS — G8929 Other chronic pain: Secondary | ICD-10-CM

## 2023-09-03 NOTE — Addendum Note (Signed)
 Addended by: Acey Ace on: 09/03/2023 09:51 AM   Modules accepted: Orders

## 2023-09-03 NOTE — Progress Notes (Signed)
 I sent My Chart message to patient to advise.

## 2023-09-03 NOTE — Progress Notes (Signed)
 I called, no answer. Voicemail note set up.

## 2023-09-03 NOTE — Telephone Encounter (Signed)
 Patient called and said she would like the referral for the rheumatology. CB#[phone]

## 2023-09-03 NOTE — Telephone Encounter (Signed)
 thx

## 2023-09-14 NOTE — Telephone Encounter (Signed)
 Called patient to schedule her SS. She did not pick up. Unable to leave vmail mail box not set up.

## 2023-09-22 DIAGNOSIS — H401131 Primary open-angle glaucoma, bilateral, mild stage: Secondary | ICD-10-CM | POA: Diagnosis not present

## 2023-09-23 ENCOUNTER — Ambulatory Visit (HOSPITAL_COMMUNITY)
Admission: RE | Admit: 2023-09-23 | Discharge: 2023-09-23 | Disposition: A | Discharge status: Home / Self Care | Source: Ambulatory Visit | Attending: Vascular Surgery | Admitting: Vascular Surgery

## 2023-09-23 DIAGNOSIS — M25561 Pain in right knee: Secondary | ICD-10-CM | POA: Diagnosis not present

## 2023-09-23 DIAGNOSIS — M25571 Pain in right ankle and joints of right foot: Secondary | ICD-10-CM

## 2023-09-23 DIAGNOSIS — M25572 Pain in left ankle and joints of left foot: Secondary | ICD-10-CM | POA: Diagnosis not present

## 2023-09-23 DIAGNOSIS — G8929 Other chronic pain: Secondary | ICD-10-CM

## 2023-09-23 DIAGNOSIS — M25562 Pain in left knee: Secondary | ICD-10-CM

## 2023-09-24 LAB — VAS US ABI WITH/WO TBI
Left ABI: 0.98
Right ABI: 1.01

## 2023-10-04 ENCOUNTER — Other Ambulatory Visit: Payer: Self-pay | Admitting: Medical

## 2023-10-04 ENCOUNTER — Other Ambulatory Visit: Payer: Self-pay | Admitting: Adult Health

## 2023-10-19 ENCOUNTER — Ambulatory Visit (INDEPENDENT_AMBULATORY_CARE_PROVIDER_SITE_OTHER): Admitting: Medical

## 2023-10-19 ENCOUNTER — Encounter: Payer: Self-pay | Admitting: Medical

## 2023-10-19 VITALS — BP 108/60 | HR 49 | Ht 63.0 in | Wt 161.6 lb

## 2023-10-19 DIAGNOSIS — E039 Hypothyroidism, unspecified: Secondary | ICD-10-CM

## 2023-10-19 DIAGNOSIS — Z794 Long term (current) use of insulin: Secondary | ICD-10-CM

## 2023-10-19 DIAGNOSIS — K219 Gastro-esophageal reflux disease without esophagitis: Secondary | ICD-10-CM | POA: Diagnosis not present

## 2023-10-19 DIAGNOSIS — Z79899 Other long term (current) drug therapy: Secondary | ICD-10-CM

## 2023-10-19 DIAGNOSIS — R1310 Dysphagia, unspecified: Secondary | ICD-10-CM | POA: Diagnosis not present

## 2023-10-19 DIAGNOSIS — E11649 Type 2 diabetes mellitus with hypoglycemia without coma: Secondary | ICD-10-CM | POA: Diagnosis not present

## 2023-10-19 DIAGNOSIS — E785 Hyperlipidemia, unspecified: Secondary | ICD-10-CM | POA: Diagnosis not present

## 2023-10-19 DIAGNOSIS — F341 Dysthymic disorder: Secondary | ICD-10-CM

## 2023-10-19 DIAGNOSIS — E1169 Type 2 diabetes mellitus with other specified complication: Secondary | ICD-10-CM | POA: Diagnosis not present

## 2023-10-19 DIAGNOSIS — I10 Essential (primary) hypertension: Secondary | ICD-10-CM | POA: Diagnosis not present

## 2023-10-19 NOTE — Progress Notes (Unsigned)
 Subjective:  Shelly Blevins is a 78 y.o. female who presents for Chief Complaint  Patient presents with   Diabetes    Patient is here for med check. She states she is confused, mostly every day. Some days she takes her medicine and some days she forgets.      Here for med check.  Here with her granddaughter as usual  She lives with her son, but granddaughter checks on her multiple times daily  Shelly Blevins takes her on medications from the pill packs.  She hasn't been using her insulin  probably in weeks.  Uses Herlene and has gotten mostly at goal readings but still getting some readings in 67-69 range.   She doesn't have much appetite.  She also gets indigestion and abdominal fullness. Sometimes feels like she has to vomit after eating or drinking  Last visit we changed to levothyroxine  which she is taking  She started vesicare  but not sure it made a big difference in urine symptoms  Last visit we changed from cymbalta  to prozac , and she does think this works better for mood   No other aggravating or relieving factors.    No other c/o.   Past Medical History:  Diagnosis Date   Allergy    Arthritis    CAD (coronary artery disease)    mild nonobstructive disease, 30% Cfx lesion   Cataract    Depression    Diabetes mellitus    insulin -dependent; Dr. Tommas   Dyslipidemia    GERD (gastroesophageal reflux disease)    Hyperlipidemia    Hypertension    Hypothyroidism    Dr. Tommas   Stroke [facility])    Current Outpatient Medications on File Prior to Visit  Medication Sig Dispense Refill   allopurinol  (ZYLOPRIM ) 100 MG tablet TAKE 1 Tablet BY MOUTH ONCE DAILY (IN THE MORNING) 90 tablet 1   amLODipine -valsartan  (EXFORGE ) 10-160 MG tablet Take 1 tablet by mouth daily. 90 tablet 3   aspirin  EC (ASPIRIN  LOW DOSE) 81 MG tablet TAKE 1 Tablet BY MOUTH ONCE DAILY (IN THE MORNING) 90 tablet 1   atorvastatin  (LIPITOR) 80 MG tablet Take 1 tablet (80 mg total) by mouth every evening. 90  tablet 1   Continuous Glucose Sensor (FREESTYLE LIBRE 3 PLUS SENSOR) MISC apply TO THE SKIN as directed. CHANGE sensor EVERY 15 DAYS 2 each 5   donepezil  (ARICEPT ) 10 MG tablet TAKE 1 Tablet BY MOUTH ONCE DAILY FOR memory loss (evening) 90 tablet 3   ezetimibe  (ZETIA ) 10 MG tablet Take 10 mg by mouth every morning.     famotidine  (PEPCID ) 40 MG tablet Take 40 mg by mouth 2 (two) times daily.     FLUoxetine  (PROZAC ) 20 MG capsule TAKE 1 Capsule BY MOUTH ONCE DAILY (morning) 30 capsule 5   FORACARE PREMIUM V10 TEST test strip 1 each by Other route in the morning, at noon, and at bedtime.     latanoprost  (XALATAN ) 0.005 % ophthalmic solution Place 1 drop into both eyes at bedtime.  5   levothyroxine  (SYNTHROID ) 50 MCG tablet TAKE 1 Tablet BY MOUTH ONCE DAILY BEFORE breakfast 30 tablet 5   metFORMIN  (GLUCOPHAGE -XR) 500 MG 24 hr tablet TAKE 1 Tablet BY MOUTH TWICE DAILY WITH a meal (MORNING, bedtime) 180 tablet 1   Multiple Vitamins-Minerals (PRESERVISION AREDS 2 PO) Take 2 capsules by mouth daily.     solifenacin  (VESICARE ) 5 MG tablet TAKE 1 Tablet BY MOUTH ONCE DAILY (evening) 30 tablet 5   spironolactone  (ALDACTONE ) 25  MG tablet Take 0.5 tablets (12.5 mg total) by mouth daily. 45 tablet 1   acetaminophen  (TYLENOL ) 500 MG tablet Take 1,000 mg by mouth every 6 (six) hours as needed for mild pain (pain score 1-3) or moderate pain (pain score 4-6). (Patient not taking: Reported on 10/19/2023)     albuterol  (VENTOLIN  HFA) 108 (90 Base) MCG/ACT inhaler Inhale 2 puffs into the lungs every 6 (six) hours as needed for wheezing or shortness of breath. (Patient not taking: Reported on 10/19/2023)     Alcohol Swabs (ALCOHOL PADS) 70 % PADS USE AS DIRECTED TO test to check blood sugar AND FOR insulin  USE (Patient not taking: Reported on 10/19/2023) 200 each 1   BD PEN NEEDLE NANO 2ND GEN 32G X 4 MM MISC 2 (two) times daily. as directed (Patient not taking: Reported on 10/19/2023)     Blood Glucose Monitoring Suppl  (FORACARE PREMIUM V10) DEVI USE TO test blood sugars THREE TIMES DAILY (Patient not taking: Reported on 10/19/2023)     Glucagon  (GVOKE HYPOPEN  2-PACK) 1 MG/0.2ML SOAJ Inject 1 each into the skin as needed. (Patient not taking: Reported on 10/19/2023) 0.2 mL 1   insulin  degludec (TRESIBA  FLEXTOUCH) 100 UNIT/ML FlexTouch Pen Inject 15 Units into the skin daily at 10 pm. (Patient not taking: Reported on 10/19/2023) 3 mL 2   insulin  lispro (HUMALOG ) 100 UNIT/ML injection Inject 0-0.06 mLs (0-6 Units total) into the skin 3 (three) times daily with meals. (Patient not taking: Reported on 10/19/2023) 10 mL 3   TRUEplus Lancets 33G MISC Apply 1 each topically 3 (three) times daily. (Patient not taking: Reported on 10/19/2023)     No current facility-administered medications on file prior to visit.     The following portions of the patient's history were reviewed and updated as appropriate: allergies, current medications, past family history, past medical history, past social history, past surgical history and problem list.  ROS Otherwise as in subjective above    Objective: BP 108/60   Pulse (!) 49   Ht 5' 3 (1.6 m)   Wt 161 lb 9.6 oz (73.3 kg)   LMP  (LMP Unknown)   SpO2 98%   BMI 28.63 kg/m   Wt Readings from Last 3 Encounters:  10/19/23 161 lb 9.6 oz (73.3 kg)  08/11/23 169 lb 3.2 oz (76.7 kg)  07/27/23 168 lb (76.2 kg)   BP Readings from Last 3 Encounters:  10/19/23 108/60  08/11/23 (!) 126/55  07/27/23 130/60   General appearance: alert, no distress, well developed, well nourished Neck: supple, no lymphadenopathy, no thyromegaly, no masses Heart: RRR, normal S1, S2, no murmurs Lungs: CTA bilaterally, no wheezes, rhonchi, or rales Abdomen: +bs, soft, non tender, non distended, no masses, no hepatomegaly, no splenomegaly Pulses: 2+ radial pulses, 2+ pedal pulses, normal cap refill Ext: no edema Psychiatry - she seems down and her mood in general    Assessment: Encounter  Diagnoses  Name Primary?   Hypothyroidism, unspecified type Yes   Essential hypertension    Dyslipidemia    Hypoglycemia associated with diabetes (HCC)    High risk medication use    Gastroesophageal reflux disease, unspecified whether esophagitis present    Type 2 diabetes mellitus with other specified complication, with long-term current use of insulin  (HCC)    Dysthymia    Dysphagia, unspecified type      Plan: Hypothyroidism - updated labs today.  We changed from 75mcg down to 50mcg last visit 4/25.    HTN - currently  on spironolactone  25mg  daily and amlodipine  valsartan  10/160mg  daily.  Readings lower today than usual.   Dyslipidemia - continue current medication  Hypoglycemia, diabetes - hasn't been taking insulin .  Her freestyle herlene is showing some readings 67-69, but most sugars are in goal range.  Stop metformin  XR 500mg  BID until results tomorrow  GERD, indigestion - referral back to GI   Dysthymia - last visit we changed from cymbalta  to fluoxetine .  She notes some improvements in mood.  Consider day program, counseling, other social interaction.   Medication changes today: STOP metformin  STOP insulin  STOP spironolactone   We will call with lab results tomorrow  Continue other medications as usual  Referrals today: Gastroenterology Cardiology     Brandye was seen today for diabetes.  Diagnoses and all orders for this visit:  Hypothyroidism, unspecified type -     TSH + free T4  Essential hypertension -     Comprehensive metabolic panel with GFR -     Ambulatory referral to Cardiology  Dyslipidemia -     Comprehensive metabolic panel with GFR  Hypoglycemia associated with diabetes (HCC)  High risk medication use  Gastroesophageal reflux disease, unspecified whether esophagitis present  Type 2 diabetes mellitus with other specified complication, with long-term current use of insulin  (HCC) -     Hemoglobin A1c -     Comprehensive metabolic  panel with GFR  Dysthymia  Dysphagia, unspecified type -     Ambulatory referral to Gastroenterology    Follow up: pending labs

## 2023-10-19 NOTE — Patient Instructions (Signed)
 STOP metformin  STOP insulin  STOP spironolactone   We will call with lab results tomorrow  Continue other medications as usual

## 2023-10-20 ENCOUNTER — Ambulatory Visit: Payer: Self-pay | Admitting: Medical

## 2023-10-20 ENCOUNTER — Other Ambulatory Visit: Payer: Self-pay | Admitting: Medical

## 2023-10-20 DIAGNOSIS — M1A9XX0 Chronic gout, unspecified, without tophus (tophi): Secondary | ICD-10-CM

## 2023-10-20 LAB — COMPREHENSIVE METABOLIC PANEL WITH GFR
ALT: 38 IU/L — ABNORMAL HIGH (ref 0–32)
AST: 31 IU/L (ref 0–40)
Albumin: 4.6 g/dL (ref 3.8–4.8)
Alkaline Phosphatase: 86 IU/L (ref 44–121)
BUN/Creatinine Ratio: 15 (ref 12–28)
BUN: 19 mg/dL (ref 8–27)
Bilirubin Total: 0.2 mg/dL (ref 0.0–1.2)
CO2: 22 mmol/L (ref 20–29)
Calcium: 9.8 mg/dL (ref 8.7–10.3)
Chloride: 101 mmol/L (ref 96–106)
Creatinine, Ser: 1.31 mg/dL — ABNORMAL HIGH (ref 0.57–1.00)
Globulin, Total: 2.9 g/dL (ref 1.5–4.5)
Glucose: 110 mg/dL — ABNORMAL HIGH (ref 70–99)
Potassium: 5.2 mmol/L (ref 3.5–5.2)
Sodium: 139 mmol/L (ref 134–144)
Total Protein: 7.5 g/dL (ref 6.0–8.5)
eGFR: 41 mL/min/1.73 — ABNORMAL LOW (ref >59)

## 2023-10-20 LAB — TSH+FREE T4
Free T4: 0.84 ng/dL (ref 0.82–1.77)
TSH: 0.888 u[IU]/mL (ref 0.450–4.500)

## 2023-10-20 LAB — HEMOGLOBIN A1C
Est. average glucose Bld gHb Est-mCnc: 151 mg/dL
Hgb A1c MFr Bld: 6.9 % — ABNORMAL HIGH (ref 4.8–5.6)

## 2023-10-20 MED ORDER — ALBUTEROL SULFATE HFA 108 (90 BASE) MCG/ACT IN AERS
2.0000 | INHALATION_SPRAY | Freq: Four times a day (QID) | RESPIRATORY_TRACT | 1 refills | Status: DC | PRN
Start: 2023-10-20 — End: 2024-01-06

## 2023-10-20 MED ORDER — AMLODIPINE BESYLATE-VALSARTAN 10-160 MG PO TABS: 1.0000 | ORAL_TABLET | Freq: Every day | ORAL | 3 refills | Status: AC

## 2023-10-20 MED ORDER — ALLOPURINOL 100 MG PO TABS: 100.0000 mg | ORAL_TABLET | Freq: Every day | ORAL | 1 refills | Status: AC

## 2023-10-20 NOTE — Progress Notes (Signed)
 Diabetes marker improved at 6.9%, kidney marker a little elevated, electrolytes okay, thyroid  okay  Given the low blood sugars and low blood pressures, make the changes I recommended yesterday.  STOP metformin  STOP insulin  STOP spironolactone   Continue to monitor blood sugars and try to monitor blood pressures.  We do not want any blood pressures less than 110/60.  If you are continuing to see low blood sugars or low blood pressure we may have to modify some things even more  Continue to rest your medicines as usual  Regarding the new fluoxetine  Prozac , if you do not feel much improved on this in regards to the mood let me know and I will try a little different approach, different medication.  I strongly recommend getting her into counseling.  I recommend a day program and some type of interaction with other people for social interaction  I have placed referrals to heart doctor and gastroenterology.  She is due for follow-up with heart doctor and given her recent swallowing symptoms and lack of appetite, I want to send her  to GI  If you are not taking the famotidine  Pepcid , please take this to help with upset stomach or acid reflux or not feeling well with appetite.  You can use Tums as needed.  Also if agreeable we have a benefit through [facility] chronic care management  Pharmacist and other team members would see her more frequently to make sure we are doing the best for her care, particularly with her different medications and memory.  If agreeable lets make a referral to chronic care management

## 2023-10-22 ENCOUNTER — Other Ambulatory Visit: Payer: Self-pay

## 2023-10-22 DIAGNOSIS — R413 Other amnesia: Secondary | ICD-10-CM

## 2023-10-22 DIAGNOSIS — Z79899 Other long term (current) drug therapy: Secondary | ICD-10-CM

## 2023-10-25 DIAGNOSIS — H401131 Primary open-angle glaucoma, bilateral, mild stage: Secondary | ICD-10-CM | POA: Diagnosis not present

## 2023-10-26 ENCOUNTER — Telehealth: Payer: Self-pay

## 2023-10-26 NOTE — Progress Notes (Signed)
 Complex Care Management Note  Care Guide Note 10/26/2023 Name: LAWANNA CECERE MRN: [id number] DOB: 1946/02/08  Naomie DELENA Salt is a 78 y.o. year old female who sees Tysinger, Alm RAMAN, PA-C for primary care. I reached out to Naomie DELENA Salt by phone today to offer complex care management services.  Ms. Randle was given information about Complex Care Management services today including:   The Complex Care Management services include support from the care team which includes your Nurse Care Manager, Clinical Social Worker, or Pharmacist.  The Complex Care Management team is here to help remove barriers to the health concerns and goals most important to you. Complex Care Management services are voluntary, and the patient may decline or stop services at any time by request to their care team member.   Complex Care Management Consent Status: Patient agreed to services and verbal consent obtained.   Follow up plan:  Telephone appointment with complex care management team member scheduled for:  10/27/23 @ 1 PM  Encounter Outcome:  Patient Scheduled  Leotis Rase [facility], [facility] Guide  Direct Dial: [phone]  Fax [phone]

## 2023-10-26 NOTE — Progress Notes (Signed)
 Care Guide Pharmacy Note  10/26/2023 Name: ANN-MARIE KLUGE MRN: [id number] DOB: [birth year]  Referred By: Bulah Alm RAMAN, PA-C Reason for referral: Complex Care Management and Call Attempt #1 (Successful Outreach scheduled with PHARM D- Jon and BSW- Tobias)   TIFINI REEDER is a 78 y.o. year old female who is a primary care patient of Bulah Alm RAMAN DEVONNA.  Naomie DELENA Salt was referred to the pharmacist for assistance related to: Medication assistance  Successful contact was made with the patient to discuss pharmacy services including being ready for the pharmacist to call at least 5 minutes before the scheduled appointment time and to have medication bottles and any blood pressure readings ready for review. The patient agreed to meet with the pharmacist via telephone visit on (date/time). 11/11/23 @ 1 PM.  Leotis Rase [facility], [facility] Guide  Direct Dial: [phone]  Fax [phone]

## 2023-10-27 ENCOUNTER — Other Ambulatory Visit: Payer: Self-pay | Admitting: Licensed Clinical Social Worker

## 2023-10-27 NOTE — Patient Instructions (Signed)
 Visit Information  Thank you for taking time to visit with me today. Please don't hesitate to contact me if I can be of assistance to you before our next scheduled appointment.  Our next appointment is by telephone on 11/10/2023 at 3:00 pm Please call the care guide team at [phone] if you need to cancel or reschedule your appointment.   Following is a copy of your care plan:   Goals Addressed             This Visit's Progress    BSW VBCI Social Work Care Plan       Problems:   Social Connections  CSW Clinical Goal(s):   Over the next 2 weeks the Patient will explore community resource options for unmet needs related to Social Connections.  Interventions:  SW will mail some senior resources and activities in [facility] and add the patient to the senior resources mailing list  Patient Goals/Self-Care Activities:  Review Agricultural engineer on senior activities in the community.  Plan:   Telephone follow up appointment with care management team member scheduled for:  11/10/2023 at 3:00pm        Please call the Suicide and Crisis Lifeline: 988 go to [facility] Urgent [facility] [address] [place] [phone]) call 911 if you are experiencing a Mental Health or Behavioral Health Crisis or need someone to talk to.  Patient verbalizes understanding of instructions and care plan provided today and agrees to view in MyChart. Active MyChart status and patient understanding of how to access instructions and care plan via MyChart confirmed with patient.     Shelly CHARM Maranda HEDWIG, PhD [facility], [facility] Social Worker Direct Dial: [phone]  Fax: [phone]

## 2023-10-27 NOTE — Patient Outreach (Signed)
 Complex Care Management   Visit Note  10/27/2023  Name:  Shelly Blevins MRN: [id number] DOB: Dec 19, 1945  Situation: Referral received for Complex Care Management related to SDOH Barriers:  Social Isolation I obtained verbal consent from Patient.  Visit completed with Joshana the patients granddaughter and the patient   on the phone  Background:   Past Medical History:  Diagnosis Date   Allergy    Arthritis    CAD (coronary artery disease)    mild nonobstructive disease, 30% Cfx lesion   Cataract    Depression    Diabetes mellitus    insulin -dependent; Dr. Tommas   Dyslipidemia    GERD (gastroesophageal reflux disease)    Hyperlipidemia    Hypertension    Hypothyroidism    Dr. Tommas   Stroke [facility])     Assessment: Patients family wants the patient to connect with some senior activities in the community    SDOH Interventions    Flowsheet Row Patient Outreach Telephone from 10/27/2023 in [place] [facility] Clinical Support from 04/28/2021 in [facility] Visit from 11/25/2020 in [facility] Telephone from 09/19/2020 in [facility]  SDOH Interventions      Food Insecurity Interventions Intervention Not Indicated -- -- Intervention Not Indicated  Housing Interventions Intervention Not Indicated -- -- Intervention Not Indicated  Transportation Interventions Intervention Not Indicated -- -- Intervention Not Indicated  Utilities Interventions Intervention Not Indicated -- -- --  Depression Interventions/Treatment  -- PHQ2-9 Score <4 Follow-up Not Indicated Counseling --  Financial Strain Interventions Intervention Not Indicated -- -- Other (Comment)  [Extra Help application completed]      Recommendation:   none  Follow Up Plan:   Telephone follow up appointment date/time:  11/10/2023 at 3:00pm  Tobias CHARM Maranda HEDWIG, PhD [facility], [facility] Social Worker Direct Dial:  [phone]  Fax: [phone]

## 2023-11-03 ENCOUNTER — Other Ambulatory Visit: Payer: Self-pay | Admitting: Medical

## 2023-11-03 ENCOUNTER — Telehealth: Payer: Self-pay | Admitting: Internal Medicine

## 2023-11-03 DIAGNOSIS — E7800 Pure hypercholesterolemia, unspecified: Secondary | ICD-10-CM

## 2023-11-03 DIAGNOSIS — I10 Essential (primary) hypertension: Secondary | ICD-10-CM

## 2023-11-03 NOTE — Telephone Encounter (Signed)
 Metformin  and spironolactone  was discontinued by shane tysinger on 10/20/2023 at visit

## 2023-11-03 NOTE — Telephone Encounter (Signed)
 Lab Results  Component Value Date   HGBA1C 6.9 (H) 10/19/2023   HGBA1C 8.1 (H) 07/27/2023   HGBA1C 7.6 (H) 02/15/2023     A1c was already done on 10/19/2023.

## 2023-11-08 NOTE — Telephone Encounter (Signed)
 Patient was identified as falling into the [place] Measure - Diabetes.   Patient was: Appointment scheduled for lab or office visit for A1c.

## 2023-11-09 ENCOUNTER — Other Ambulatory Visit: Payer: Self-pay | Admitting: Licensed Clinical Social Worker

## 2023-11-09 NOTE — Patient Outreach (Signed)
 Complex Care Management   Visit Note  11/09/2023  Name:  Shelly Blevins MRN: [id number] DOB: [birth year]  Situation: Referral received for Complex Care Management related to SDOH Barriers:  Social Isolation I obtained verbal consent from Caregiver Patient.  Visit completed with granddaughter Shelly Blevins  on the phone  Background:   Past Medical History:  Diagnosis Date   Allergy    Arthritis    CAD (coronary artery disease)    mild nonobstructive disease, 30% Cfx lesion   Cataract    Depression    Diabetes mellitus    insulin -dependent; Dr. Tommas   Dyslipidemia    GERD (gastroesophageal reflux disease)    Hyperlipidemia    Hypertension    Hypothyroidism    Dr. Tommas   Stroke [facility])     Assessment: Patient received the resources by my for the senior activities. And SW made referral to senior resources  SDOH Interventions    Flowsheet Row Patient Outreach Telephone from 10/27/2023 in [place] [facility] Clinical Support from 04/28/2021 in [facility] Visit from 11/25/2020 in [facility] Telephone from 09/19/2020 in [facility]  SDOH Interventions      Food Insecurity Interventions Intervention Not Indicated -- -- Intervention Not Indicated  Housing Interventions Intervention Not Indicated -- -- Intervention Not Indicated  Transportation Interventions Intervention Not Indicated -- -- Intervention Not Indicated  Utilities Interventions Intervention Not Indicated -- -- --  Depression Interventions/Treatment  -- PHQ2-9 Score <4 Follow-up Not Indicated Counseling --  Financial Strain Interventions Intervention Not Indicated -- -- Other (Comment)  [Extra Help application completed]    Recommendation:   none  Follow Up Plan:   Closing From:  Complex Care Management  Tobias CHARM Maranda HEDWIG, PhD [facility], [facility] Social Worker Direct Dial: [phone]  Fax: [phone]

## 2023-11-09 NOTE — Patient Instructions (Signed)

## 2023-11-10 ENCOUNTER — Other Ambulatory Visit: Admitting: Licensed Clinical Social Worker

## 2023-11-10 ENCOUNTER — Telehealth: Payer: Self-pay | Admitting: Internal Medicine

## 2023-11-10 DIAGNOSIS — E1165 Type 2 diabetes mellitus with hyperglycemia: Secondary | ICD-10-CM | POA: Diagnosis not present

## 2023-11-10 LAB — COMPREHENSIVE METABOLIC PANEL WITH GFR
Albumin: 4.9 (ref 3.5–5.0)
Calcium: 10.3 (ref 8.7–10.7)
Globulin: 3.2
eGFR: 36

## 2023-11-10 LAB — BASIC METABOLIC PANEL WITH GFR
BUN: 22 — AB (ref 4–21)
CO2: 23 — AB (ref 13–22)
Chloride: 98 — AB (ref 99–108)
Creatinine: 1.5 — AB (ref 0.5–1.1)
Glucose: 138
Potassium: 5 meq/L (ref 3.5–5.1)
Sodium: 138 (ref 137–147)

## 2023-11-10 LAB — HEPATIC FUNCTION PANEL
ALT: 34 U/L (ref 7–35)
AST: 28 (ref 13–35)
Alkaline Phosphatase: 94 (ref 25–125)

## 2023-11-10 LAB — HEMOGLOBIN A1C: Hemoglobin A1C: 7.3

## 2023-11-10 NOTE — Telephone Encounter (Signed)
 Please advise. I see you discontinued this at her recent visit in July   Copied from CRM #[id number]. Topic: Clinical - Prescription Issue >> Nov 10, 2023  4:06 PM Fredrica W wrote: Reason for CRM: Patient refused medication delivery because she said provider discontinue metformin  and spironolactone  (ALDACTONE ) 25 MG tablet and insulin . Pharmacy called to confirm. Call call back- [phone]

## 2023-11-11 ENCOUNTER — Other Ambulatory Visit (INDEPENDENT_AMBULATORY_CARE_PROVIDER_SITE_OTHER)

## 2023-11-11 DIAGNOSIS — E1169 Type 2 diabetes mellitus with other specified complication: Secondary | ICD-10-CM

## 2023-11-11 DIAGNOSIS — Z79899 Other long term (current) drug therapy: Secondary | ICD-10-CM

## 2023-11-11 NOTE — Progress Notes (Signed)
 11/11/2023 Name: Shelly Blevins MRN: [id number] DOB: [birth year]  Chief Complaint  Patient presents with   Medication Management   Diabetes    Shelly Blevins is a 78 y.o. year old female who presented for a telephone visit.   They were referred to the pharmacist by their PCP for assistance in managing diabetes and complex medication management.    Subjective:  Care Team: Primary Care Provider: Bulah Alm RAMAN, PA-C   Medication Access/Adherence  Current Pharmacy:  [facility] - [place], [place] - [address] [place] [address] Phone: [phone] Fax: [phone]   Patient reports affordability concerns with their medications: No  Patient reports access/transportation concerns to their pharmacy: No  Patient reports adherence concerns with their medications:  Yes  - memory issues, do use bubble packing and report no missed doses since Sunday as of now   Diabetes:  Current medications: None Medications tried in the past: Metformin , Tresiba , Novolog , Farxiga   Current glucose readings: 146 at time of this call, 2+ hour postprandial reading Granddaughter pulled history off freestyle libre device and provided the following: No low BG events in the last 7 days 14 day avg BG 154   Observed patterns:  Patient denies hypoglycemic s/sx including dizziness, shakiness, sweating. Patient denies hyperglycemic symptoms including polyuria, polydipsia, polyphagia, nocturia, neuropathy, blurred vision.   Current medication access support: None  Hypertension:  Current medications: Amlodipine -Valsartan  Medications previously tried: Spironolactone , hydrochlorothiazide , Ramipril    Hyperlipidemia/ASCVD Risk Reduction  Current lipid lowering medications: Atorvastatin  80mg  Medications tried in the past: zetia  (well controlled LDL, stopped by cardio 11/2022)  Antiplatelet regimen: Aspirin  81mg    Objective:  Lab Results  Component  Value Date   HGBA1C 6.9 (H) 10/19/2023    Lab Results  Component Value Date   CREATININE 1.31 (H) 10/19/2023   BUN 19 10/19/2023   NA 139 10/19/2023   K 5.2 10/19/2023   CL 101 10/19/2023   CO2 22 10/19/2023    Lab Results  Component Value Date   CHOL 125 09/23/2022   HDL 45 09/23/2022   LDLCALC 53 09/23/2022   TRIG 157 (H) 09/23/2022   CHOLHDL 2.8 09/23/2022    Medications Reviewed Today     Reviewed by Lionell Jon DEL, RPH (Pharmacist) on 11/11/23 at 1338  Med List Status: <None>   Medication Order Taking? Sig Documenting Provider Last Dose Status Informant  acetaminophen  (TYLENOL ) 500 MG tablet [id number] Yes Take 1,000 mg by mouth every 6 (six) hours as needed for mild pain (pain score 1-3) or moderate pain (pain score 4-6). [provider]  Active Self, Pharmacy Records           Med Note BEVERLEE, VERONICA F   Tue Oct 19, 2023  2:54 PM)    albuterol  (VENTOLIN  HFA) 108 [phone] Base) MCG/ACT inhaler [id number] Yes Inhale 2 puffs into the lungs every 6 (six) hours as needed for wheezing or shortness of breath. Tysinger, Alm RAMAN, PA-C  Active   Alcohol Swabs (ALCOHOL PADS) 70 % PADS [id number]  USE AS DIRECTED TO test to check blood sugar AND FOR insulin  USE  Patient not taking: Reported on 10/19/2023   Bulah Alm RAMAN, PA-C  Active   allopurinol  (ZYLOPRIM ) 100 MG tablet [id number] Yes Take 1 tablet (100 mg total) by mouth daily. Tysinger, Alm RAMAN, PA-C  Active   amLODipine -valsartan  (EXFORGE ) 10-160 MG tablet [id number] Yes Take 1 tablet by mouth daily. Tysinger, Alm RAMAN, PA-C  Active  aspirin  EC (ASPIRIN  LOW DOSE) 81 MG tablet [id number] Yes TAKE 1 Tablet BY MOUTH ONCE DAILY (IN THE MORNING) Tysinger, Alm RAMAN, PA-C  Active   atorvastatin  (LIPITOR) 80 MG tablet [id number] Yes TAKE 1 Tablet BY MOUTH ONCE DAILY (IN THE EVENING) Tysinger, Alm RAMAN, PA-C  Active   BD PEN NEEDLE NANO 2ND GEN 32G X 4 MM MISC [id number] Yes 2 (two) times daily. as directed [provider]   Active Self, Pharmacy Records  Blood Glucose Monitoring Suppl [facility] PREMIUM V10) DEVI [id number]  USE TO test blood sugars THREE TIMES DAILY  Patient not taking: Reported on 10/19/2023   [provider]  Active   Continuous Glucose Sensor (FREESTYLE LIBRE 3 PLUS SENSOR) [place] [id number] Yes apply TO THE SKIN as directed. CHANGE sensor EVERY 15 DAYS Tysinger, Alm RAMAN, PA-C  Active   donepezil  (ARICEPT ) 10 MG tablet [id number] Yes TAKE 1 Tablet BY MOUTH ONCE DAILY FOR memory loss (evening) Sherryl Bouchard, NP  Active   ezetimibe  (ZETIA ) 10 MG tablet [id number]  Take 10 mg by mouth every morning. [provider]  Active   famotidine  (PEPCID ) 40 MG tablet [id number]  Take 40 mg by mouth 2 (two) times daily. [provider]  Active Self, Pharmacy Records  FLUoxetine  (PROZAC ) 20 MG capsule [id number] Yes TAKE 1 Capsule BY MOUTH ONCE DAILY (morning) Tysinger, Alm RAMAN, PA-C  Active   FORACARE PREMIUM V10 TEST test strip [id number]  1 each by Other route in the morning, at noon, and at bedtime. [provider]  Active   Glucagon  (GVOKE HYPOPEN  2-PACK) 1 MG/0.2ML SOAJ [id number]  Inject 1 each into the skin as needed.  Patient not taking: Reported on 10/19/2023   Bulah Alm RAMAN, PA-C  Active   latanoprost  (XALATAN ) 0.005 % ophthalmic solution [id number] Yes Place 1 drop into both eyes at bedtime. [provider]  Active Self, Pharmacy Records  levothyroxine  (SYNTHROID ) 50 MCG tablet [id number] Yes TAKE 1 Tablet BY MOUTH ONCE DAILY BEFORE breakfast Bulah Alm RAMAN, PA-C  Active   metFORMIN  (GLUCOPHAGE -XR) 500 MG 24 hr tablet [id number]  TAKE 1 Tablet BY MOUTH TWICE DAILY WITH a meal (MORNING, bedtime)  Patient not taking: Reported on 11/11/2023   Bulah Alm RAMAN, PA-C  Active   Multiple Vitamins-Minerals (PRESERVISION AREDS 2 PO) [id number] Yes Take 2 capsules by mouth daily. [provider]  Active   solifenacin  (VESICARE ) 5 MG tablet [id number] Yes TAKE 1  Tablet BY MOUTH ONCE DAILY (evening) Tysinger, Alm RAMAN, PA-C  Active   spironolactone  (ALDACTONE ) 25 MG tablet [id number]  TAKE ONE-HALF TABLET BY MOUTH DAILY (MORNING)  Patient not taking: Reported on 11/11/2023   Bulah Alm RAMAN, PA-C  Active   TRUEplus Lancets 33G MISC [id number]  Apply 1 each topically 3 (three) times daily.  Patient not taking: Reported on 10/19/2023   [provider]  Active               Assessment/Plan:   Diabetes: - Currently controlled - Reviewed long term cardiovascular and renal outcomes of uncontrolled blood sugar - Reviewed goal A1c, goal fasting, and goal 2 hour post prandial glucose - Recommend to continue to monitor sugars, reach out if you begin to see elevated BG now that metformin  and insulin  have been stopped  - Patient denies personal or family history of multiple endocrine neoplasia type 2, medullary thyroid  cancer; personal history of pancreatitis or gallbladder disease. - Recommend to check glucose continuously  Hypertension: - Currently controlled - Reviewed  long term cardiovascular and renal outcomes of uncontrolled blood pressure - Reviewed appropriate blood pressure monitoring technique and reviewed goal blood pressure. Recommended to check home blood pressure and heart rate once weekly - Recommend to continue current medication therapy    Hyperlipidemia/ASCVD Risk Reduction: - Currently controlled.  - Reviewed long term complications of uncontrolled cholesterol - Recommend to continue current medication therapy, need updated lipid panel at next PCP or cardio visit   Adherence Concerns -Utilize blister pack and reports going well -Granddaughter plans to set alarm on her phone to call patient and make sure she has taken her meds each day   Follow Up Plan: 12/02/23  Jon VEAR Lindau, PharmD Clinical Pharmacist [phone]

## 2023-11-11 NOTE — Telephone Encounter (Signed)
 Called and discontinued medications at pharmacy

## 2023-11-17 DIAGNOSIS — N1832 Chronic kidney disease, stage 3b: Secondary | ICD-10-CM | POA: Diagnosis not present

## 2023-11-17 DIAGNOSIS — E1165 Type 2 diabetes mellitus with hyperglycemia: Secondary | ICD-10-CM | POA: Diagnosis not present

## 2023-11-17 DIAGNOSIS — E039 Hypothyroidism, unspecified: Secondary | ICD-10-CM | POA: Diagnosis not present

## 2023-11-17 DIAGNOSIS — I10 Essential (primary) hypertension: Secondary | ICD-10-CM | POA: Diagnosis not present

## 2023-11-17 DIAGNOSIS — Z794 Long term (current) use of insulin: Secondary | ICD-10-CM | POA: Diagnosis not present

## 2023-11-23 ENCOUNTER — Telehealth: Payer: Self-pay | Admitting: Medical

## 2023-11-23 NOTE — Telephone Encounter (Signed)
 Copied from CRM #[id number]. Topic: Clinical - Request for Lab/Test Order >> Nov 22, 2023  3:50 PM Carlatta H wrote: Reason for CRM: Lab requisition was sent today and they needs a call [phone] to confirm//advised of time frame >> Nov 22, 2023  4:31 PM Leita HERO wrote: I called [facility] back and they faxed a lab requisition, I checked & we haven't received it. I asked them to refax >> Nov 22, 2023  4:23 PM Leita HERO wrote: [facility]  Fax received, this tests were not ordered  faxed back as Denied ( not a legitimate order/request)

## 2023-11-30 ENCOUNTER — Ambulatory Visit

## 2023-11-30 ENCOUNTER — Telehealth: Payer: Self-pay

## 2023-11-30 DIAGNOSIS — R296 Repeated falls: Secondary | ICD-10-CM

## 2023-11-30 NOTE — Telephone Encounter (Signed)
 Pt has an appt on 9/4 I placed urgent referral for [facility]

## 2023-11-30 NOTE — Addendum Note (Signed)
 Addended by: VICCI HUSBAND A on: 11/30/2023 03:29 PM   Modules accepted: Orders

## 2023-11-30 NOTE — Telephone Encounter (Signed)
 This nurse called patient for AWV. Patient's granddaughter states that she is sleeping right now. She mentioned that patient has fallen 3 times in the past 2 months and did not want her to tell any one. She said that patient stated that she believes she has had a mini stroke. I have scheduled an appointment with Ludie for 12/02/2023 to discuss these issues.

## 2023-12-02 ENCOUNTER — Ambulatory Visit: Admitting: Medical

## 2023-12-02 ENCOUNTER — Other Ambulatory Visit

## 2023-12-16 ENCOUNTER — Telehealth: Payer: Self-pay

## 2023-12-16 ENCOUNTER — Other Ambulatory Visit

## 2023-12-16 NOTE — Progress Notes (Signed)
   12/16/2023  Patient ID: Shelly Blevins, female   DOB: [birth year], 78 y.o.   MRN: [id number]  Attempted to contact patient for scheduled appointment for medication management. Left HIPAA compliant message for patient to return my call at their convenience.   Jon VEAR Lindau, PharmD Clinical Pharmacist [phone]

## 2023-12-20 ENCOUNTER — Telehealth: Payer: Self-pay | Admitting: Neurology

## 2023-12-20 ENCOUNTER — Ambulatory Visit (INDEPENDENT_AMBULATORY_CARE_PROVIDER_SITE_OTHER): Payer: Medicare Other | Admitting: Adult Health

## 2023-12-20 VITALS — BP 148/58 | HR 46 | Ht 62.0 in | Wt 167.2 lb

## 2023-12-20 DIAGNOSIS — R413 Other amnesia: Secondary | ICD-10-CM | POA: Diagnosis not present

## 2023-12-20 NOTE — Telephone Encounter (Signed)
 Patient was seen today in an office visit.   She is scheduled at [facility] for Thursday 01/13/2024 at 8 pm.  I gave the patient the packet information for the sleep study.   NPSG [employer] medicare no auth req.

## 2023-12-20 NOTE — Progress Notes (Signed)
 PATIENT: Shelly Blevins DOB: [birth year]  REASON FOR VISIT: follow up HISTORY FROM: patient PRIMARY NEUROLOGIST: Dr. Ines  Chief Complaint  Patient presents with   Follow-up    Rm 4, alone.  Memory loss.     HISTORY OF PRESENT ILLNESS: Today 12/20/23:  Shelly Blevins is a 78 y.o. female with a history of memory disturbance. Returns today for follow-up.  Overall she feels that her memory is relatively stable.  She reports good days and bad days.  She continues to live with her grandson and sister.  At home she is able to complete all ADLs independently.  She does note that she has trouble remembering the days of the week and misplacing items.  She does not operate a motor vehicle.  Her granddaughter helps her manage her medications appointments and finances.  She did move the Aricept  to the a.m. but reports that she still has vivid dreams.  She did see Dr. Buck and a sleep study was ordered.  Patient is scheduled for October.  She returns today for an evaluation.   04/12/23: Shelly Blevins is a 78 y.o. female with a history of memory disturbance. Returns today for follow-up. Feels that some days her memory is good other days she notices some problems. She lives with her grandson and sister. Able to complete all ADLs independently. No longer operating a motor vehicle. Granddaughter helps with her meds, appointments and finances. No change in mood or behavior. Reports that she is having vivid dreams. Sometimes she can't tell if she is dreaming or really see those things. Happens at night, never has this during the day. Remains on Aricept  10 mg at bedtime.    09/07/22: Shelly Blevins is a 78 y.o. female with a history of memory disturbance. Returns today for follow-up.  At the last visit we increased Aricept  to 10 mg at bedtime.  She reports that she is tolerating this well.  She continues to live with her sister and grandson.  She is able to complete all ADLs independently.  Reports that  she is sleeping well.  Her granddaughter manages her finances and appointments.  She states that she does not sleep well because her sister keeps her up talking.   02/23/22: Shelly Blevins is a 78 y.o. female who has been followed in this office for memory disturbance. Returns today for follow-up. Feels that memory is better. Lives with her grandson and sister. Able to complete all ADLs independently. Needs help with appointments. Manages her own medications but notices that she may forget. Granddaughter helps remind her about medication. Sleeps well. Reports that she may get agitated easy.  Currently taking Aricept  5 mg at bedtime and tolerating it well.   HISTORY Shelly Blevins is a 78 y.o. female here as requested by Bulah Alm RAMAN, PA-C for memory. PMHx chronic gout, diabetic neuropathy, hypercholesterolemia, long-term use of insulin , decreased pulse(reviewed pulses over the last 2 years and have been normal), memory changes, hypothyroidism, coronary artery disease, memory changes.   She says she has had slowly progressive memory problems for years. Losing keys, losing her pocket book,she can put something up in a cabinet and forget where she puts it at. She lives with her grandchildren, and great grand kids and she helps with caretaking and that can be stressful one has autism. No problems with driving or getting lost. She sometimes feel she repeats things, like her appointments her granddaughter helps her keep up with them. She keeps  a calendar. Sometimes she may miss medications but her daughter reminds her. Patient puts her pills in a pill box but sometimes she makes mistakes. Daughter writes out the bills for her, sometimes she can write a check and feels anxious, she reports anxiety and depression. She has a lot of anxiety and depression; Stress at home with raising great grandchildren. She is sad some days. Her daughter died this week and that is making things worse. I recommend seeing  psychiatry/therapy as guided by Alm gent. Some days I could dig a hole and get in. She has a sister who has been diagnosed with dementia, she is 2 years older and has forgotten her age unknown if Alzheimer's. She feels worse since she retired from [employer] a few years ago. She has episodes of confusion, possible altered mental status. No other focal neurologic deficits, associated symptoms, inciting events or modifiable factors.     Reviewed notes, labs and imaging from outside physicians, which showed:   05/28/2021: RPR NR, 04/28/2021 HIV NR, Hep c neg, b12 1081   MRI brain 01/21/2017: reviewed images and agree:  The brain parenchyma shows minimum age-appropriate periventricular and subcortical white matter hyperintensities from chronic small vessel disease as well as mild degree of generalized cerebral atrophy. No structural lesion, tumor or infarcts are noted. Paranasal sinuses show mild chronic inflammatory changes.No abnormal lesions are seen on diffusion-weighted views to suggest acute ischemia. The cortical sulci, fissures and cisterns are normal in size and appearance. Lateral, third and fourth ventricle are normal in size and appearance. No extra-axial fluid collections are seen. No evidence of mass effect or midline shift.  No abnormal lesions are seen on post contrast views.  On sagittal views the posterior fossa, pituitary gland and corpus callosum are unremarkable. No evidence of intracranial hemorrhage on gradient-echo views. The orbits and their contents, paranasal sinuses and calvarium are unremarkable.  Intracranial flow voids are present. Thin sections through the cerebellopontine angles show normal appearance of the VIII nerve complexes without any abnormal enhancement or tumors noted  REVIEW OF SYSTEMS: Out of a complete 14 system review of symptoms, the patient complains only of the following symptoms, and all other reviewed systems are negative.  ALLERGIES: No Known  Allergies  HOME MEDICATIONS: Outpatient Medications Prior to Visit  Medication Sig Dispense Refill   acetaminophen  (TYLENOL ) 500 MG tablet Take 1,000 mg by mouth every 6 (six) hours as needed for mild pain (pain score 1-3) or moderate pain (pain score 4-6).     albuterol  (VENTOLIN  HFA) 108 (90 Base) MCG/ACT inhaler Inhale 2 puffs into the lungs every 6 (six) hours as needed for wheezing or shortness of breath. 8 g 1   Alcohol Swabs (ALCOHOL PADS) 70 % PADS USE AS DIRECTED TO test to check blood sugar AND FOR insulin  USE 200 each 1   allopurinol  (ZYLOPRIM ) 100 MG tablet Take 1 tablet (100 mg total) by mouth daily. 90 tablet 1   amLODipine -valsartan  (EXFORGE ) 10-160 MG tablet Take 1 tablet by mouth daily. 90 tablet 3   aspirin  EC (ASPIRIN  LOW DOSE) 81 MG tablet TAKE 1 Tablet BY MOUTH ONCE DAILY (IN THE MORNING) 90 tablet 1   atorvastatin  (LIPITOR) 80 MG tablet TAKE 1 Tablet BY MOUTH ONCE DAILY (IN THE EVENING) 90 tablet 1   BD PEN NEEDLE NANO 2ND GEN 32G X 4 MM MISC 2 (two) times daily. as directed     Blood Glucose Monitoring Suppl (FORACARE PREMIUM V10) DEVI USE TO test blood sugars THREE  TIMES DAILY     Continuous Glucose Sensor (FREESTYLE LIBRE 3 PLUS SENSOR) MISC apply TO THE SKIN as directed. CHANGE sensor EVERY 15 DAYS 2 each 5   donepezil  (ARICEPT ) 10 MG tablet TAKE 1 Tablet BY MOUTH ONCE DAILY FOR memory loss (evening) 90 tablet 3   famotidine  (PEPCID ) 40 MG tablet Take 40 mg by mouth 2 (two) times daily.     FLUoxetine  (PROZAC ) 20 MG capsule TAKE 1 Capsule BY MOUTH ONCE DAILY (morning) 30 capsule 5   FORACARE PREMIUM V10 TEST test strip 1 each by Other route in the morning, at noon, and at bedtime.     Glucagon  (GVOKE HYPOPEN  2-PACK) 1 MG/0.2ML SOAJ Inject 1 each into the skin as needed. 0.2 mL 1   latanoprost  (XALATAN ) 0.005 % ophthalmic solution Place 1 drop into both eyes at bedtime.  5   levothyroxine  (SYNTHROID ) 50 MCG tablet TAKE 1 Tablet BY MOUTH ONCE DAILY BEFORE breakfast 30  tablet 5   Multiple Vitamins-Minerals (PRESERVISION AREDS 2 PO) Take 2 capsules by mouth daily.     solifenacin  (VESICARE ) 5 MG tablet TAKE 1 Tablet BY MOUTH ONCE DAILY (evening) 30 tablet 5   TRUEplus Lancets 33G MISC Apply 1 each topically 3 (three) times daily.     No facility-administered medications prior to visit.    PAST MEDICAL HISTORY: Past Medical History:  Diagnosis Date   Allergy    Arthritis    CAD (coronary artery disease)    mild nonobstructive disease, 30% Cfx lesion   Cataract    Depression    Diabetes mellitus    insulin -dependent; Dr. Tommas   Dyslipidemia    GERD (gastroesophageal reflux disease)    Hyperlipidemia    Hypertension    Hypothyroidism    Dr. Tommas   Stroke [facility])     PAST SURGICAL HISTORY: Past Surgical History:  Procedure Laterality Date   ABDOMINAL HYSTERECTOMY     CARDIAC CATHETERIZATION  02/17/2007   mild CAD with 50% prox L Cfx stenosis, normal LV function (Dr. HILARIO Jester)   CATARACT EXTRACTION Left    CHOLECYSTECTOMY     NM MYOCAR PERF WALL MOTION  2009   lexiscan  myoview  - perfusion defect in anterior myociaral region (breast attenuation) - low risk scan - EF 69%   THYROIDECTOMY     TRANSTHORACIC ECHOCARDIOGRAM  2008   normal study     FAMILY HISTORY: Family History  Problem Relation Age of Onset   Heart failure Mother    Diabetes Mother    Diabetes Father    Dementia Sister    Diabetes Sister    Heart failure Brother 81   Diabetes Brother    Diabetes Daughter    Hypertension Daughter    Hyperlipidemia Daughter    Diabetes Daughter    Bone cancer Daughter    Colon cancer Neg Hx    Esophageal cancer Neg Hx    Stomach cancer Neg Hx    Rectal cancer Neg Hx    Alzheimer's disease Neg Hx     SOCIAL HISTORY: Social History   Socioeconomic History   Marital status: Widowed    Spouse name: Not on file   Number of children: 2   Years of education: 10   Highest education level: 10th grade  Occupational History     Employer: [employer]  Tobacco Use   Smoking status: Former    Current packs/day: 0.00    Types: Cigarettes    Quit date: 04/02/1986  Years since quitting: 37.7   Smokeless tobacco: Never  Vaping Use   Vaping status: Never Used  Substance and Sexual Activity   Alcohol use: No   Drug use: No   Sexual activity: Not on file  Other Topics Concern   Not on file  Social History Narrative   Caffiene occasional 1 cup   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, 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    Social Drivers of Corporate investment banker Strain: Low Risk  (11/29/2023)   Overall Financial Resource Strain (CARDIA)    Difficulty of Paying Living Expenses: Not hard at all  Food Insecurity: Food Insecurity Present (11/29/2023)   Hunger Vital Sign    Worried About Running Out of Food in the Last Year: Sometimes true    Ran Out of Food in the Last Year: Sometimes true  Transportation Needs: No Transportation Needs (11/29/2023)   PRAPARE - Administrator, Civil Service (Medical): No    Lack of Transportation (Non-Medical): No  Physical Activity: Insufficiently Active (11/29/2023)   Exercise Vital Sign    Days of Exercise per Week: 1 day    Minutes of Exercise per Session: 10 min  Stress: Stress Concern Present (11/29/2023)   [employer] of Occupational Health - Occupational Stress Questionnaire    Feeling of Stress: To some extent  Social Connections: Socially Isolated (11/29/2023)   Social Connection and Isolation Panel    Frequency of Communication with Friends and Family: Three times a week    Frequency of Social Gatherings with Friends and Family: Never    Attends Religious Services: Never    Database administrator or Organizations: No    Attends Engineer, structural: Not on file    Marital Status: Widowed  Intimate Partner Violence: Not At Risk (10/27/2023)   Humiliation, Afraid, Rape, and Kick questionnaire    Fear of Current or Ex-Partner: No    Emotionally Abused: No     Physically Abused: No    Sexually Abused: No      PHYSICAL EXAM  Vitals:   12/20/23 1057  BP: (!) 148/58  Pulse: (!) 46  Weight: 167 lb 3.2 oz (75.8 kg)  Height: 5' 2 (1.575 m)     Body mass index is 30.58 kg/m.     12/20/2023   11:07 AM 04/12/2023   10:40 AM 09/07/2022    9:53 AM  MMSE - Mini Mental State Exam  Orientation to time 3 4 3   Orientation to Place 5 5 5   Registration 3 3 3   Attention/ Calculation 0 1 0  Recall 0 2 3  Language- name 2 objects 2 2 2   Language- repeat 1 1 1   Language- follow 3 step command 3 2 3   Language- read & follow direction 1 1 0  Write a sentence 1 1 0  Copy design 0 0 0  Total score 19 22 20      Generalized: Well developed, in no acute distress   Neurological examination  Mentation: Alert oriented to time, place, history taking. Follows all commands speech and language fluent Cranial nerve II-XII: Pupils were equal round reactive to light. Extraocular movements were full, visual field were full on confrontational test. Facial sensation and strength were normal. Head turning and shoulder shrug  were normal and symmetric. Motor: The motor testing reveals 5 over 5 strength of all 4 extremities. Good symmetric motor tone is noted throughout.  Sensory: Sensory testing is intact to soft touch on all 4  extremities. No evidence of extinction is noted.  Coordination: Cerebellar testing reveals good finger-nose-finger and heel-to-shin bilaterally.  Gait and station: Gait is normal.    DIAGNOSTIC DATA (LABS, IMAGING, TESTING) - I reviewed patient records, labs, notes, testing and imaging myself where available.  Lab Results  Component Value Date   WBC 6.9 07/27/2023   HGB 10.6 (L) 07/27/2023   HCT 31.8 (L) 07/27/2023   MCV 92 07/27/2023   PLT 253 07/27/2023      Component Value Date/Time   NA 139 10/19/2023 1604   K 5.2 10/19/2023 1604   CL 101 10/19/2023 1604   CO2 22 10/19/2023 1604   GLUCOSE 110 (H) 10/19/2023 1604    GLUCOSE 137 (H) 10/13/2022 1501   BUN 19 10/19/2023 1604   CREATININE 1.31 (H) 10/19/2023 1604   CREATININE 0.90 11/02/2016 0934   CALCIUM  9.8 10/19/2023 1604   PROT 7.5 10/19/2023 1604   ALBUMIN 4.6 10/19/2023 1604   AST 31 10/19/2023 1604   ALT 38 (H) 10/19/2023 1604   ALKPHOS 86 10/19/2023 1604   BILITOT 0.2 10/19/2023 1604   GFRNONAA 50 (L) 08/20/2022 0627   GFRAA 66 04/25/2020 1151   Lab Results  Component Value Date   CHOL 125 09/23/2022   HDL 45 09/23/2022   LDLCALC 53 09/23/2022   TRIG 157 (H) 09/23/2022   CHOLHDL 2.8 09/23/2022   Lab Results  Component Value Date   HGBA1C 6.9 (H) 10/19/2023   Lab Results  Component Value Date   VITAMINB12 1,081 04/28/2021   Lab Results  Component Value Date   TSH 0.888 10/19/2023      ASSESSMENT AND PLAN 78 y.o. year old female  has a past medical history of Allergy, Arthritis, CAD (coronary artery disease), Cataract, Depression, Diabetes mellitus, Dyslipidemia, GERD (gastroesophageal reflux disease), Hyperlipidemia, Hypertension, Hypothyroidism, and Stroke (HCC). here with :  1.  Memory disturbance  - Continue Aricept  to 10 mg daily  -MMSE 19/30  previously 22/30 -We discussed doing blood work to look for Alzheimer's disease however she deferred.  We also discussed adding on Namenda and she deferred.  Does not wish to have any further workup completed. -I did advise the patient that she could potentially try melatonin 1 to 5 mg 2 hours before bedtime to see if this helps with vivid dreams.  She will also be having a sleep study next month. - FU in 1 year or sooner if needed    Duwaine Russell, MSN, NP-C 12/20/2023, 11:10 AM [facility] [address] [place], [place] [address] [phone]  The patient's condition requires frequent monitoring and adjustments in the treatment plan, reflecting the ongoing complexity of care.  This provider is the continuing focal point for all needed services for  this condition.

## 2023-12-21 ENCOUNTER — Other Ambulatory Visit: Payer: Self-pay | Admitting: Medical

## 2023-12-21 DIAGNOSIS — Z1231 Encounter for screening mammogram for malignant neoplasm of breast: Secondary | ICD-10-CM

## 2024-01-06 ENCOUNTER — Other Ambulatory Visit: Payer: Self-pay | Admitting: Medical

## 2024-01-13 ENCOUNTER — Telehealth: Payer: Self-pay | Admitting: Neurology

## 2024-01-13 ENCOUNTER — Ambulatory Visit (INDEPENDENT_AMBULATORY_CARE_PROVIDER_SITE_OTHER): Admitting: Neurology

## 2024-01-13 DIAGNOSIS — E663 Overweight: Secondary | ICD-10-CM

## 2024-01-13 DIAGNOSIS — Z9189 Other specified personal risk factors, not elsewhere classified: Secondary | ICD-10-CM

## 2024-01-13 DIAGNOSIS — R351 Nocturia: Secondary | ICD-10-CM

## 2024-01-13 DIAGNOSIS — R519 Headache, unspecified: Secondary | ICD-10-CM

## 2024-01-13 DIAGNOSIS — G4719 Other hypersomnia: Secondary | ICD-10-CM

## 2024-01-13 DIAGNOSIS — G4733 Obstructive sleep apnea (adult) (pediatric): Secondary | ICD-10-CM

## 2024-01-13 DIAGNOSIS — G4720 Circadian rhythm sleep disorder, unspecified type: Secondary | ICD-10-CM

## 2024-01-13 DIAGNOSIS — R0681 Apnea, not elsewhere classified: Secondary | ICD-10-CM

## 2024-01-13 DIAGNOSIS — R0683 Snoring: Secondary | ICD-10-CM | POA: Diagnosis not present

## 2024-01-13 DIAGNOSIS — R6889 Other general symptoms and signs: Secondary | ICD-10-CM

## 2024-01-13 NOTE — Telephone Encounter (Signed)
 Sent mychart

## 2024-01-17 NOTE — Procedures (Unsigned)
 Physician Interpretation: Please see link under Procedure Tab or under Encounters tab for physician report, technical report, as well as O2 titration and/or PAP titration tables (if applicable).

## 2024-01-18 NOTE — Progress Notes (Signed)
 Shelly Blevins                                          MRN: [id number]   01/18/2024   The [facility] Quality Team Specialist reviewed this patient medical record for the purposes of chart review for care gap closure. The following were reviewed: chart review for care gap closure-kidney health evaluation for diabetes:eGFR  and uACR.    VBCI Quality Team

## 2024-01-21 ENCOUNTER — Ambulatory Visit: Payer: Self-pay | Admitting: Neurology

## 2024-01-21 DIAGNOSIS — G4733 Obstructive sleep apnea (adult) (pediatric): Secondary | ICD-10-CM

## 2024-01-25 ENCOUNTER — Ambulatory Visit
Admission: RE | Admit: 2024-01-25 | Discharge: 2024-01-25 | Disposition: A | Discharge status: Home / Self Care | Source: Ambulatory Visit | Attending: Medical | Admitting: Medical

## 2024-01-25 DIAGNOSIS — Z1231 Encounter for screening mammogram for malignant neoplasm of breast: Secondary | ICD-10-CM

## 2024-01-28 ENCOUNTER — Ambulatory Visit: Payer: Self-pay | Admitting: Medical

## 2024-01-31 ENCOUNTER — Encounter: Payer: Self-pay | Admitting: Radiology

## 2024-02-02 ENCOUNTER — Ambulatory Visit: Admitting: Podiatry

## 2024-02-02 ENCOUNTER — Encounter: Payer: Self-pay | Admitting: Podiatry

## 2024-02-02 VITALS — Ht 62.0 in | Wt 167.0 lb

## 2024-02-02 DIAGNOSIS — M79674 Pain in right toe(s): Secondary | ICD-10-CM | POA: Diagnosis not present

## 2024-02-02 DIAGNOSIS — B351 Tinea unguium: Secondary | ICD-10-CM

## 2024-02-02 DIAGNOSIS — M79675 Pain in left toe(s): Secondary | ICD-10-CM

## 2024-02-02 DIAGNOSIS — E1140 Type 2 diabetes mellitus with diabetic neuropathy, unspecified: Secondary | ICD-10-CM

## 2024-02-02 DIAGNOSIS — I999 Unspecified disorder of circulatory system: Secondary | ICD-10-CM | POA: Diagnosis not present

## 2024-02-02 DIAGNOSIS — E1149 Type 2 diabetes mellitus with other diabetic neurological complication: Secondary | ICD-10-CM

## 2024-02-02 NOTE — Progress Notes (Signed)
 Subjective:   Patient ID: Naomie DELENA Salt, female   DOB: 78 y.o.   MRN: [id number]   HPI Patient presents with caregiver with significant nail disease 1-5 both feet long-term diabetes and possible vascular disease with patient having been tested several months ago.  Patient does not currently smoke tries to be active   Review of Systems  All other systems reviewed and are negative.       Objective:  Physical Exam Vitals and nursing note reviewed.  Constitutional:      Appearance: She is well-developed.  Pulmonary:     Effort: Pulmonary effort is normal.  Musculoskeletal:        General: Normal range of motion.  Skin:    General: Skin is warm.  Neurological:     Mental Status: She is alert.     Vascular status moderately diminished bilateral with history of vascular studies which were essentially normal with possibility for calcifications of arteries along with thick yellow brittle nailbeds 1-5 both feet that get incurvated with some splitting of the left hallux nail with probable trauma and neurological indicates moderate diminishment sharp dull vibratory.  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

## 2024-03-03 ENCOUNTER — Other Ambulatory Visit: Payer: Self-pay | Admitting: Medical

## 2024-03-16 ENCOUNTER — Encounter: Payer: Self-pay | Admitting: Medical

## 2024-03-16 DIAGNOSIS — R413 Other amnesia: Secondary | ICD-10-CM | POA: Diagnosis not present

## 2024-03-20 ENCOUNTER — Telehealth: Payer: Self-pay | Admitting: Pharmacy Technician

## 2024-03-20 ENCOUNTER — Other Ambulatory Visit (HOSPITAL_COMMUNITY): Payer: Self-pay

## 2024-03-20 NOTE — Telephone Encounter (Signed)
 Pharmacy Patient Advocate Encounter   Received notification from Onbase/CMM that prior authorization for Freestyle Libre 3 plus sensor is due for renewal.   Insurance verification completed.   The patient is insured through [facility].  Action: PA required; PA submitted to above mentioned insurance via Latent Key/confirmation #/EOC [facility] Status is pending

## 2024-03-20 NOTE — Telephone Encounter (Signed)
 Pharmacy Patient Advocate Encounter  Received notification from [employer] that Prior Authorization for [facility] 3 plus has been APPROVED from now to 03/29/25   PA #/Case ID/Reference #: [id number]

## 2024-03-24 ENCOUNTER — Other Ambulatory Visit: Payer: Self-pay | Admitting: Medical

## 2024-04-19 ENCOUNTER — Encounter: Admitting: Neurology

## 2024-05-02 ENCOUNTER — Other Ambulatory Visit: Payer: Self-pay | Admitting: Medical

## 2024-05-09 ENCOUNTER — Ambulatory Visit

## 2024-12-19 ENCOUNTER — Ambulatory Visit: Admitting: Adult Health
